# Patient Record
Sex: Male | Born: 1945 | Race: White | Hispanic: No | Marital: Married | Smoking: Former smoker
Health system: Southern US, Community
[De-identification: ages and names within clinical notes are randomized; demographics above are authoritative.]

## PROBLEM LIST (undated history)

## (undated) DIAGNOSIS — K219 Gastro-esophageal reflux disease without esophagitis: Secondary | ICD-10-CM

## (undated) DIAGNOSIS — I2510 Atherosclerotic heart disease of native coronary artery without angina pectoris: Secondary | ICD-10-CM

## (undated) DIAGNOSIS — I10 Essential (primary) hypertension: Secondary | ICD-10-CM

## (undated) DIAGNOSIS — C801 Malignant (primary) neoplasm, unspecified: Secondary | ICD-10-CM

## (undated) DIAGNOSIS — M1990 Unspecified osteoarthritis, unspecified site: Secondary | ICD-10-CM

## (undated) HISTORY — PX: FRACTURE SURGERY: SHX138

## (undated) HISTORY — PX: COLONOSCOPY: SHX174

## (undated) HISTORY — PX: APPENDECTOMY: SHX54

## (undated) HISTORY — PX: CARDIAC CATHETERIZATION: SHX172

## (undated) HISTORY — PX: CORONARY ANGIOPLASTY: SHX604

---

## 2004-11-05 DIAGNOSIS — I219 Acute myocardial infarction, unspecified: Secondary | ICD-10-CM

## 2004-11-05 HISTORY — DX: Acute myocardial infarction, unspecified: I21.9

## 2005-02-15 ENCOUNTER — Emergency Department: Payer: Self-pay | Admitting: Emergency Medicine

## 2005-02-15 IMAGING — CR DG CHEST 1V PORT
1 series · 2 of 2 positions shown · non-contrast
Comparison: none

REASON FOR EXAM: chest pain  acute mi  [HOSPITAL]
COMMENTS:

PROCEDURE:     DXR - DXR PORTABLE CHEST SINGLE VIEW  - [DATE]  [DATE]
RESULT:       The lungs are clear.   Cardiovascular structures are
unremarkable.

[Series 1: view not recorded · 0.17mm/px · 2 of 2 slices shown]
[im 1/2]
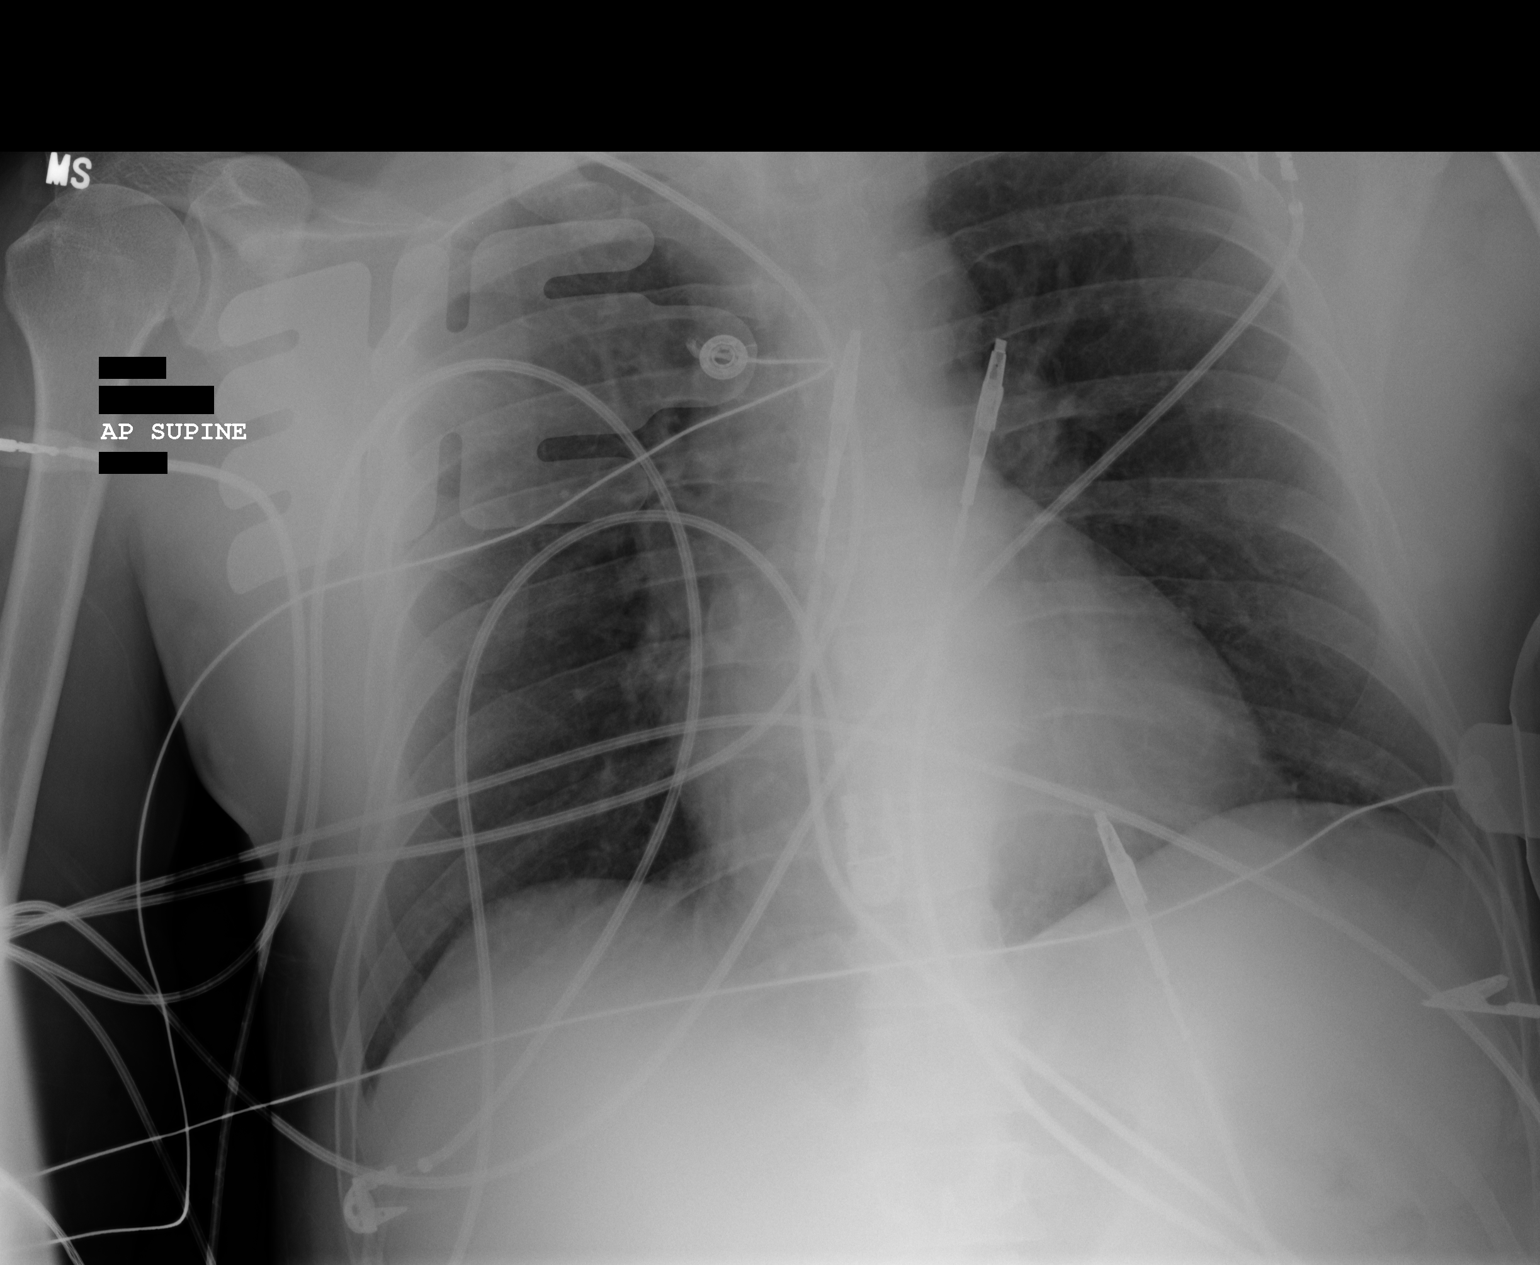
[im 2/2]
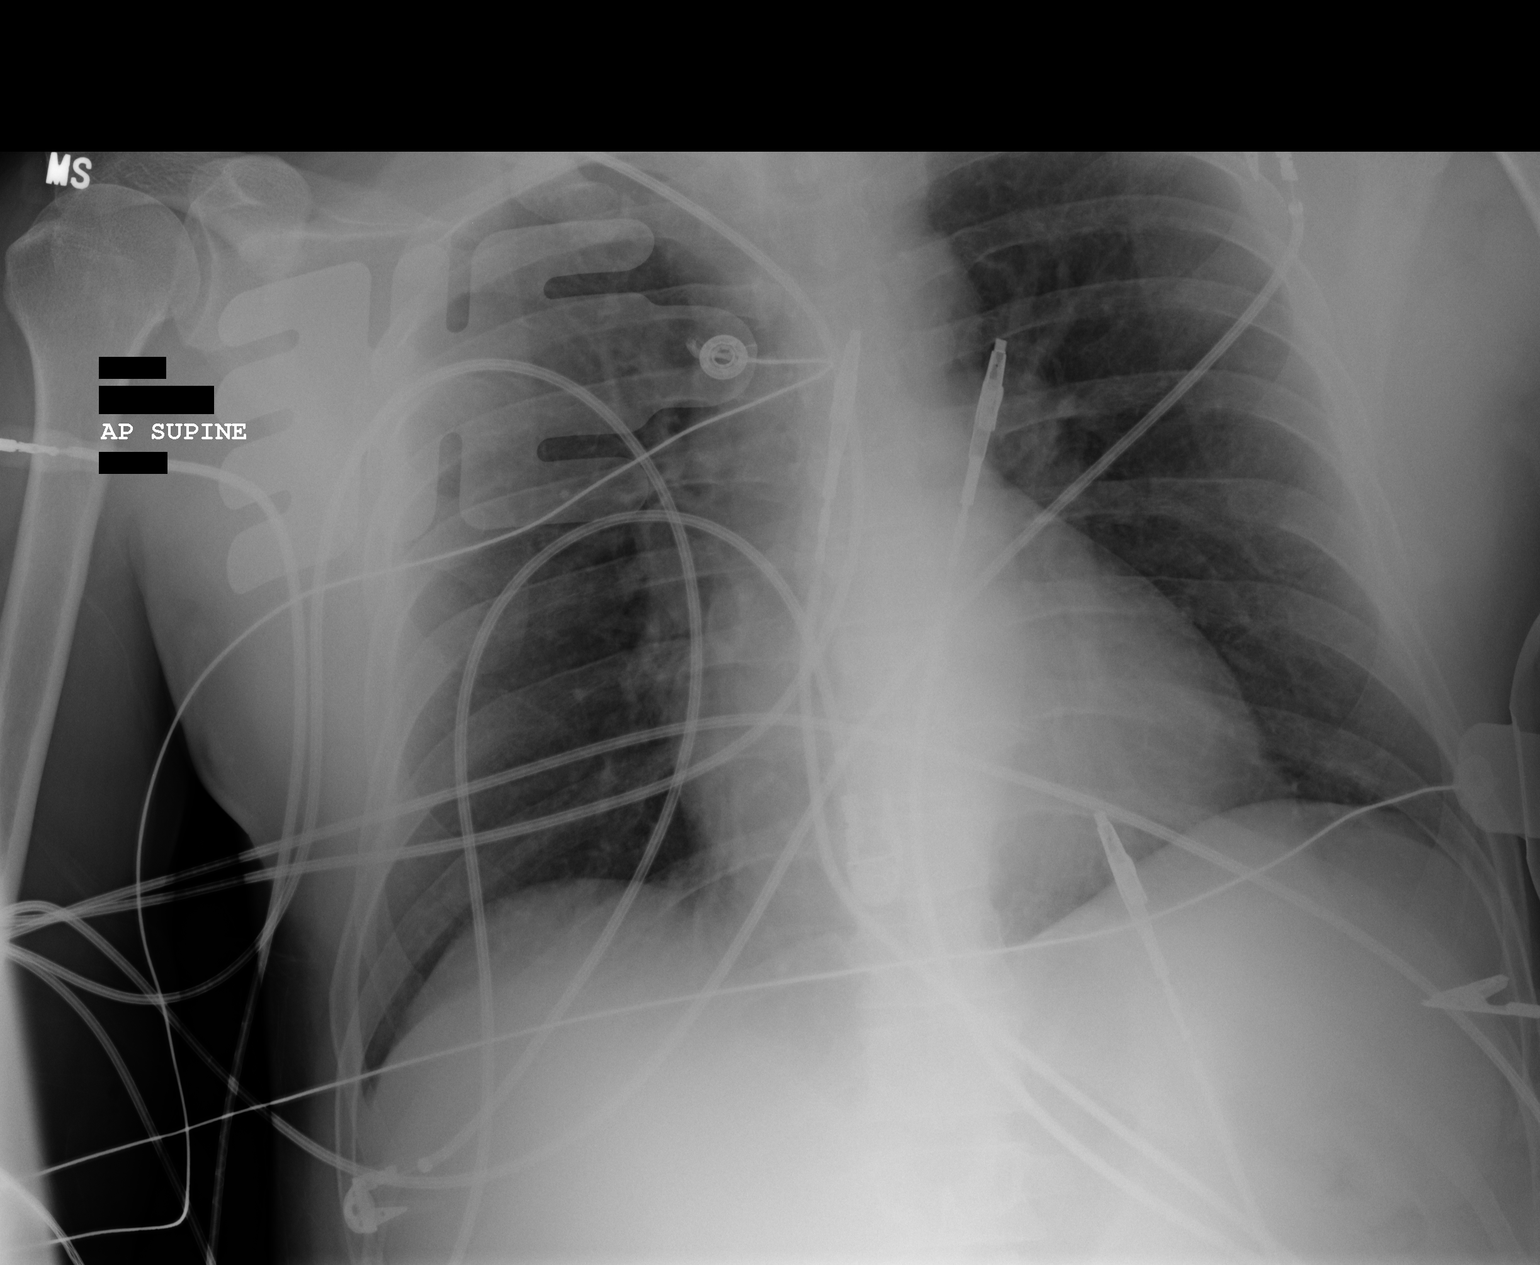

[2 of 2 positions shown; findings below may reference images not displayed]

IMPRESSION: No acute cardiopulmonary disease.

## 2005-04-17 ENCOUNTER — Emergency Department: Payer: Self-pay | Admitting: Emergency Medicine

## 2006-11-20 ENCOUNTER — Ambulatory Visit: Payer: Self-pay | Admitting: Family Medicine

## 2006-11-20 IMAGING — CT CT CHEST W/ CM
1 series · 16 of 33 positions shown, 20 images · IV contrast (agent unspecified)
Comparison: none

REASON FOR EXAM: cough SOB
COMMENTS:

PROCEDURE:     CT  - CT CHEST WITH CONTRAST  - [DATE]  [DATE]
RESULT:
REASON FOR CONSULTATION:  Cough with shortness of breath.
TECHNIQUE: Axial images were obtained from the apices to the hemidiaphragm
post intravenous injection of contrast material.  Both mediastinal and lung
window settings were obtained.

[Series 2: soft tissue · axial · 0.77mm/px · z∈[-286,+8]mm · 16 of 65 slices shown, 20 images]
[im 3/65  mediastinal]
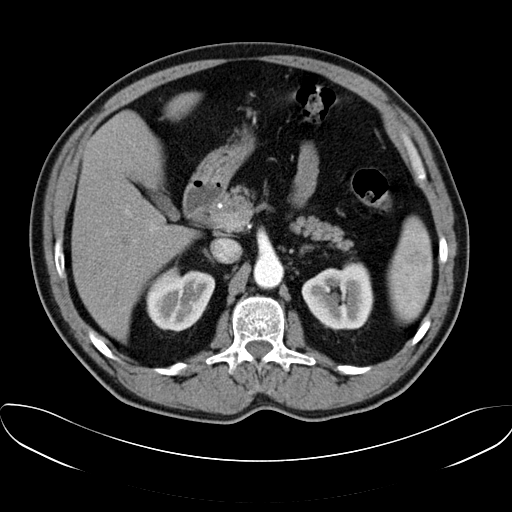
[im 3/65  lung]
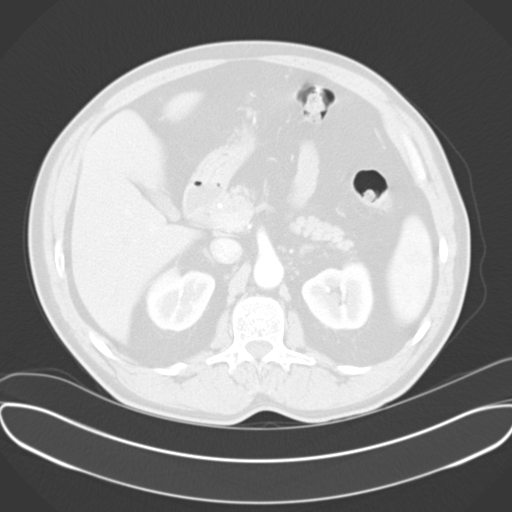
[im 8/65  lung]
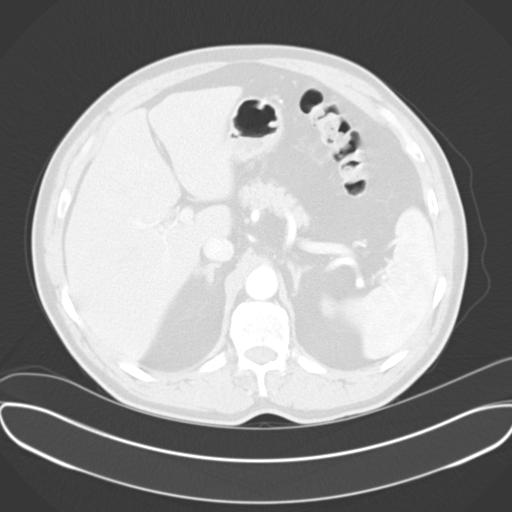
[im 12/65  lung]
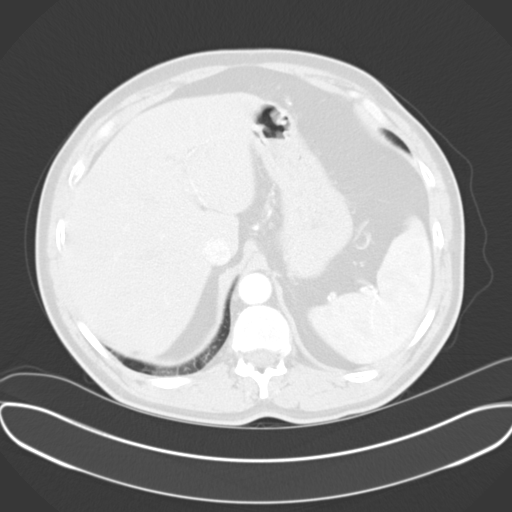
[im 15/65  lung]
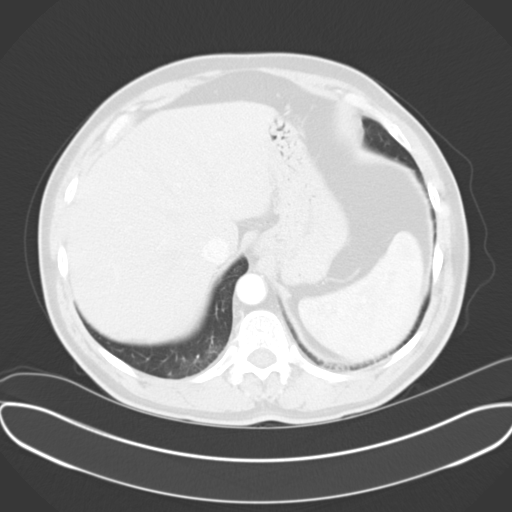
[im 19/65  mediastinal]
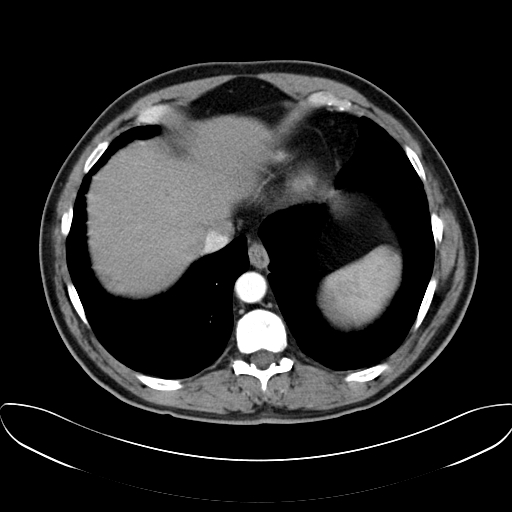
[im 19/65  lung]
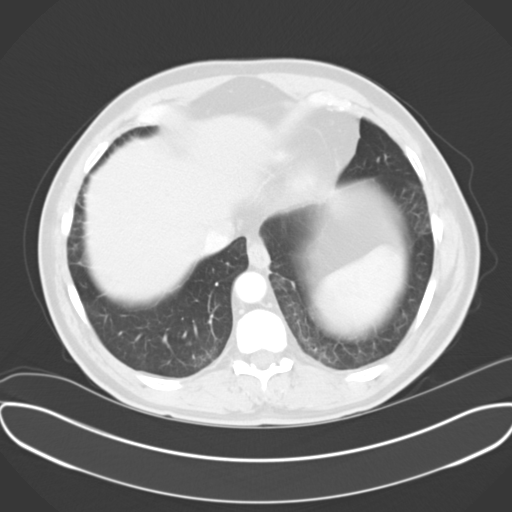
[im 24/65  lung]
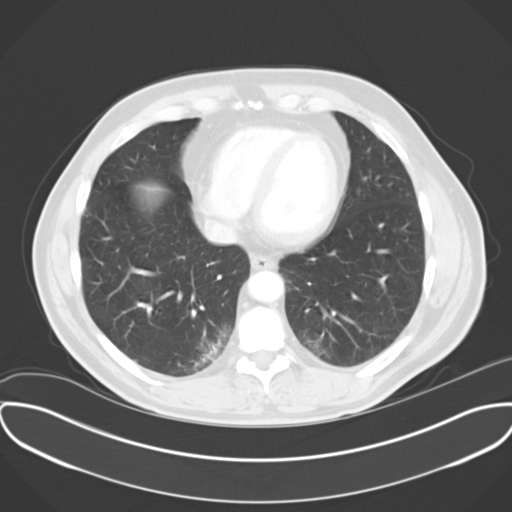
[im 27/65  lung]
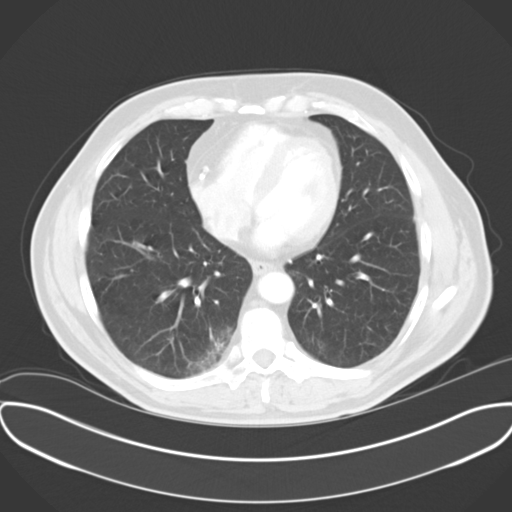
[im 31/65  lung]
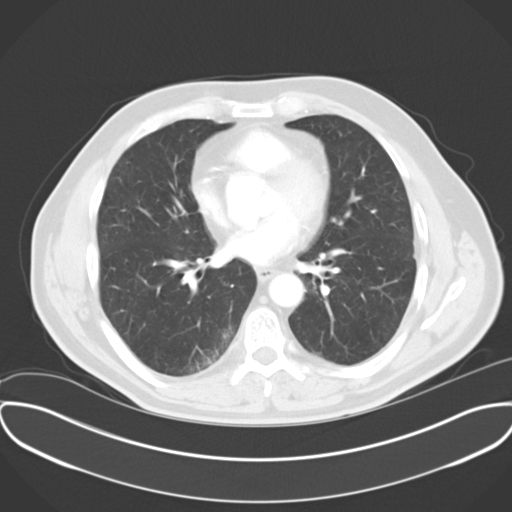
[im 35/65  mediastinal]
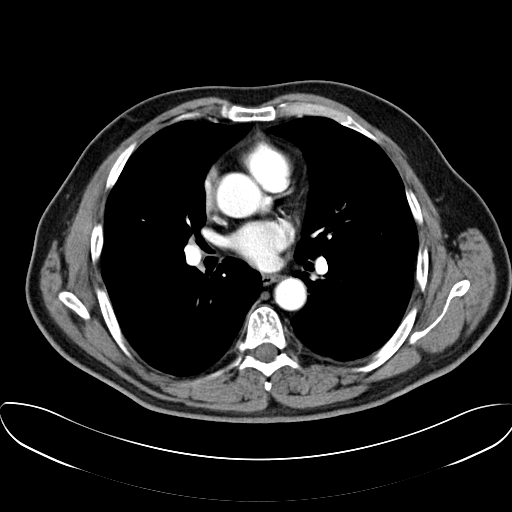
[im 35/65  lung]
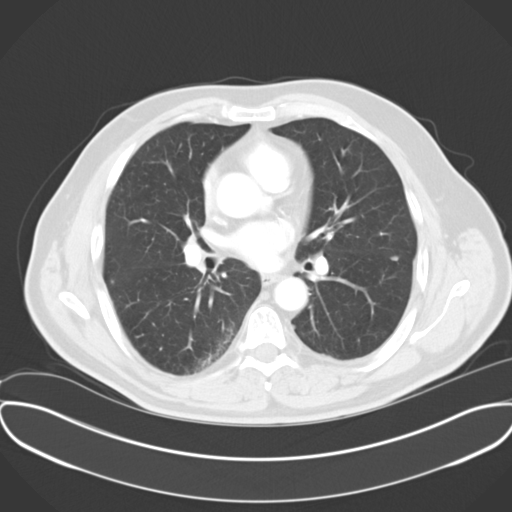
[im 38/65  lung]
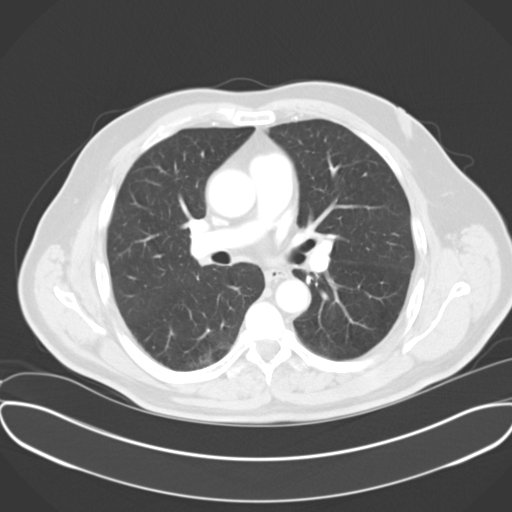
[im 41/65  lung]
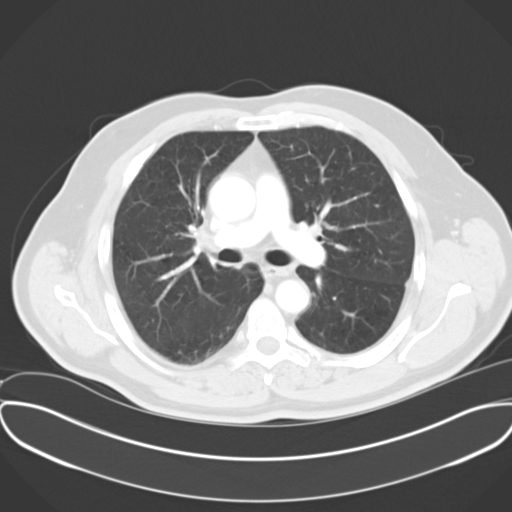
[im 46/65  lung]
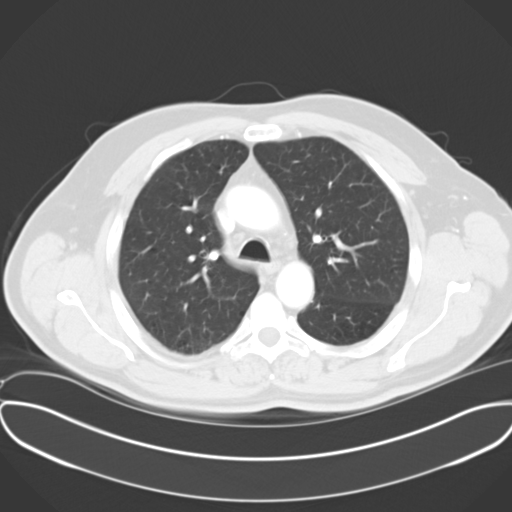
[im 50/65  mediastinal]
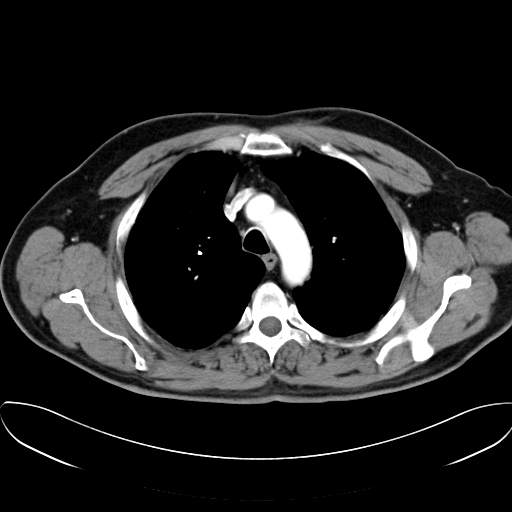
[im 50/65  lung]
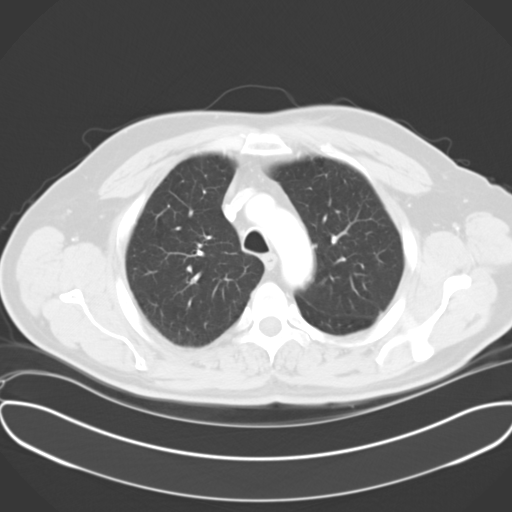
[im 53/65  lung]
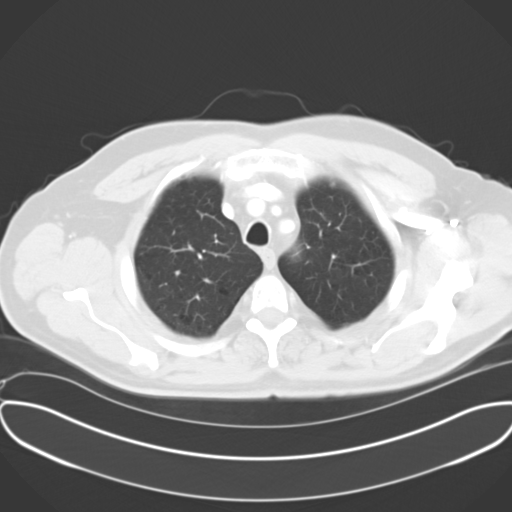
[im 57/65  lung]
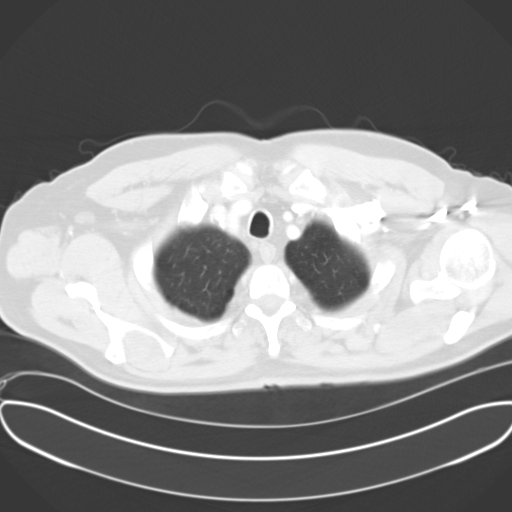
[im 62/65  lung]
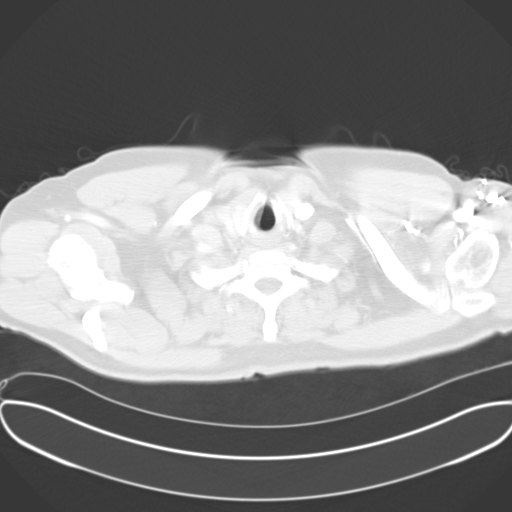

[16 of 33 positions shown; findings below may reference images not displayed]

FINDINGS: On the mediastinal window settings there is some shotty lymph
nodes in the upper mediastinum and aorticopulmonary window.  No hilar or
subcarinal adenopathy is noted.  On the lung window settings some dependent
atelectasis is noted in the lung bases in the paraspinal regions.  No
effusion is identified.  No nodules are seen.
IMPRESSION: Dependent atelectasis in the lung bases and paraspinal areas, otherwise no
additional abnormalities are detected.

## 2008-02-09 ENCOUNTER — Ambulatory Visit: Payer: Self-pay | Admitting: Family Medicine

## 2008-02-09 IMAGING — RF DG BARIUM SWALLOW
1 series · 15 of 24 positions shown · non-contrast
Comparison: none

REASON FOR EXAM: Hiatal hernia with chronic back pain
COMMENTS:

[Series 1: run · 14 acquisitions, 15 frames shown]
[im 1/14]
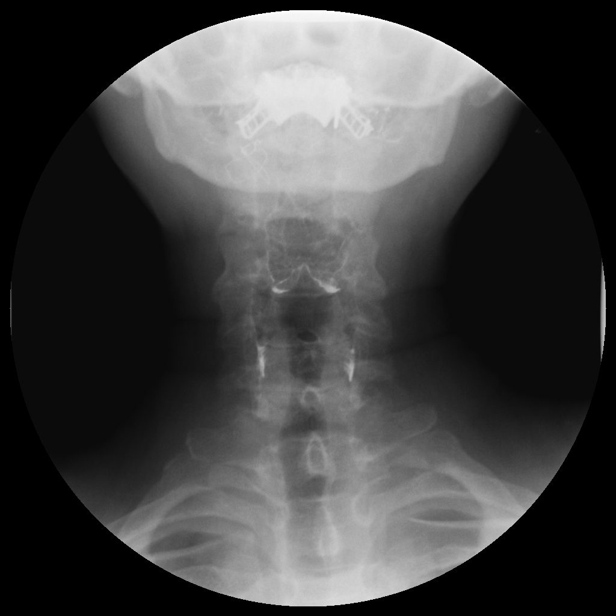
[im 1/14]
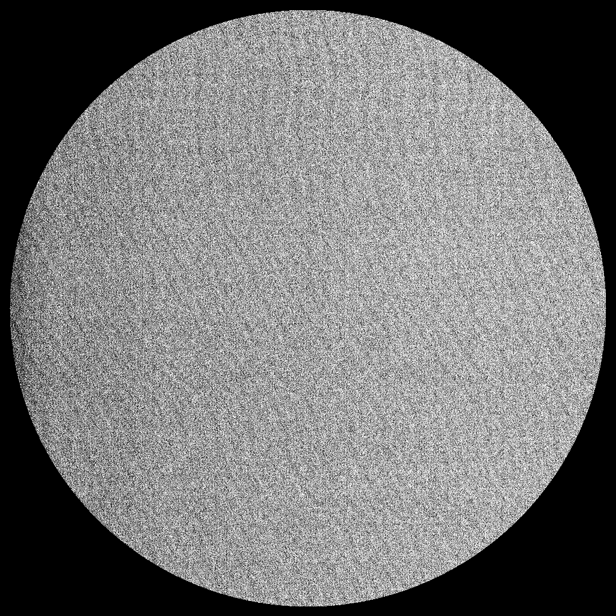
[im 2/14]
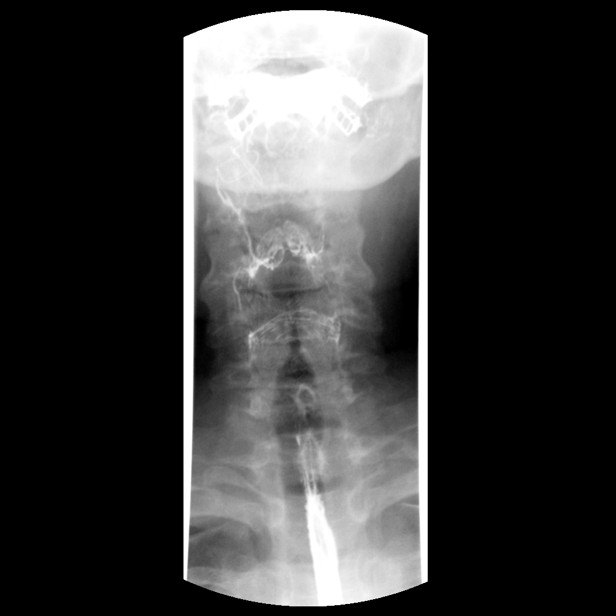
[im 2/14]
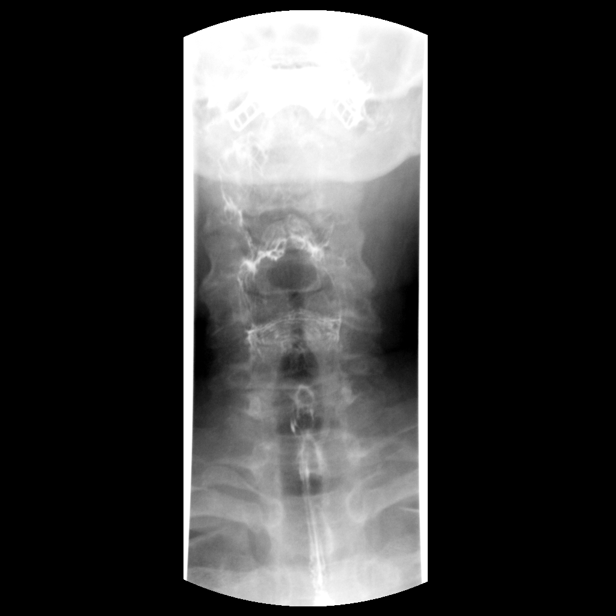
[im 3/14]
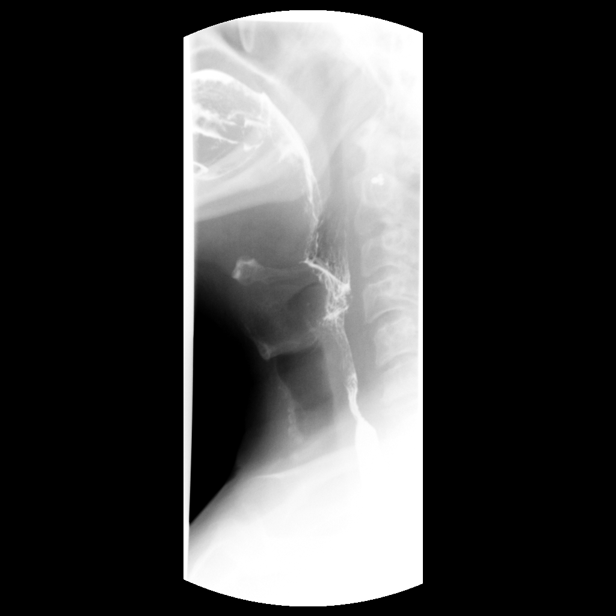
[im 3/14]
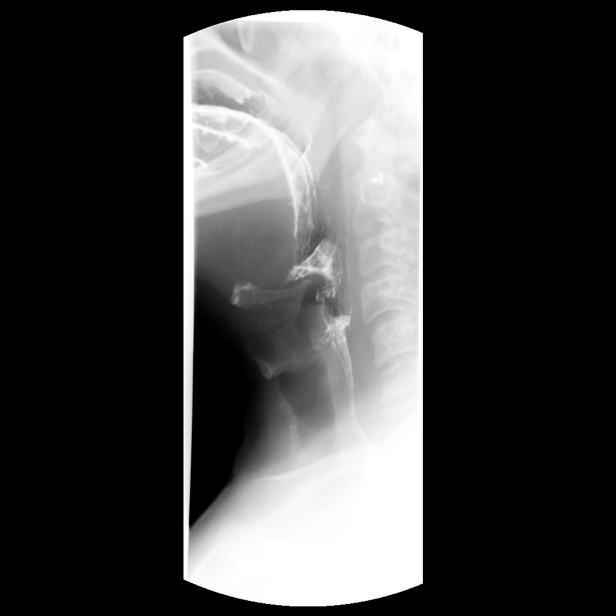
[im 4/14]
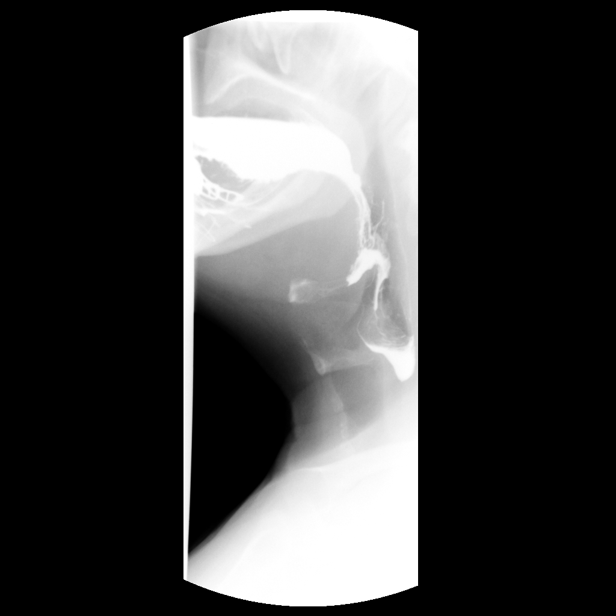
[im 4/14]
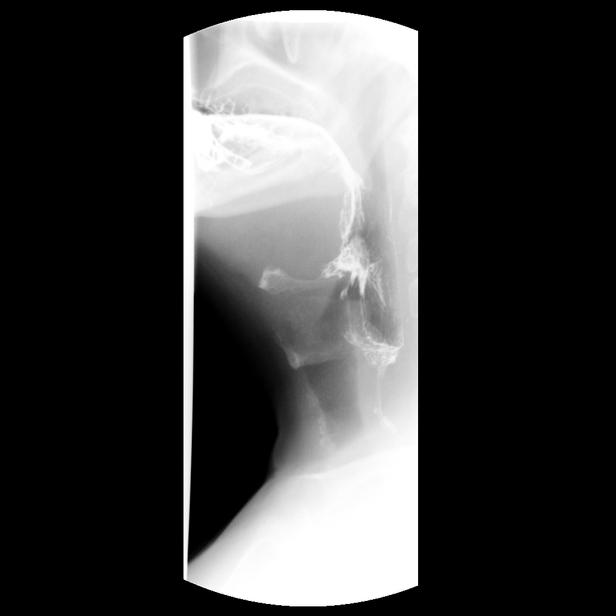
[im 5/14]
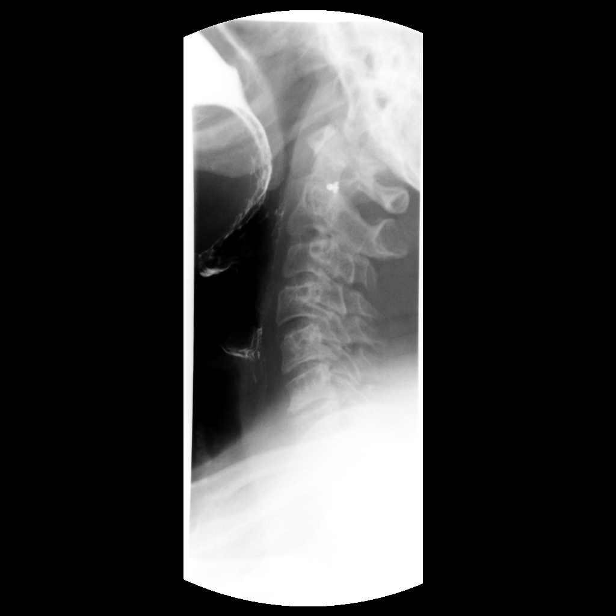
[im 5/14]
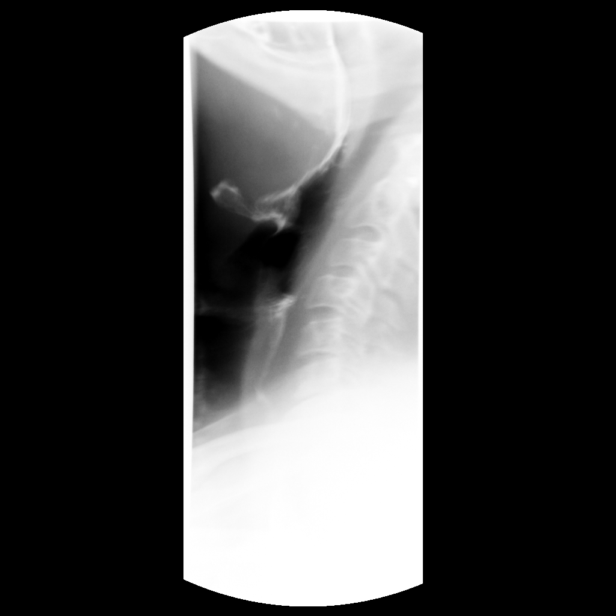
[im 6/14]
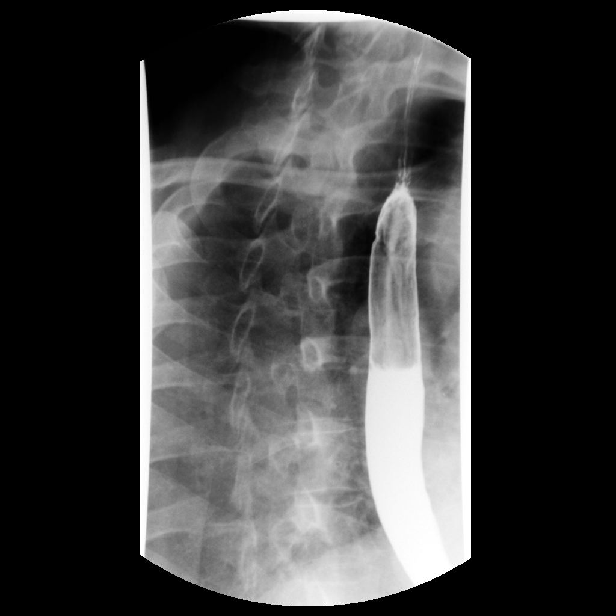
[im 8/14]
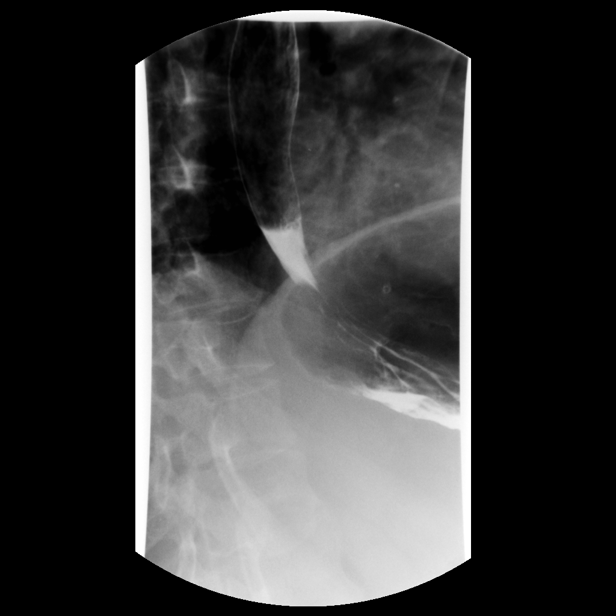
[im 10/14]
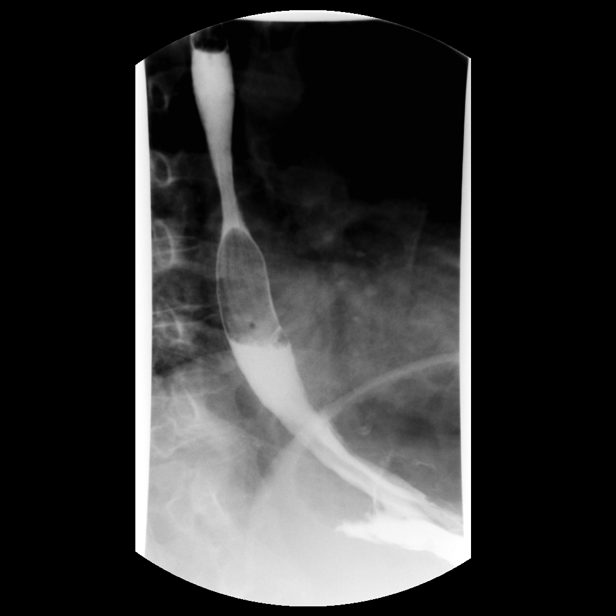
[im 12/14]
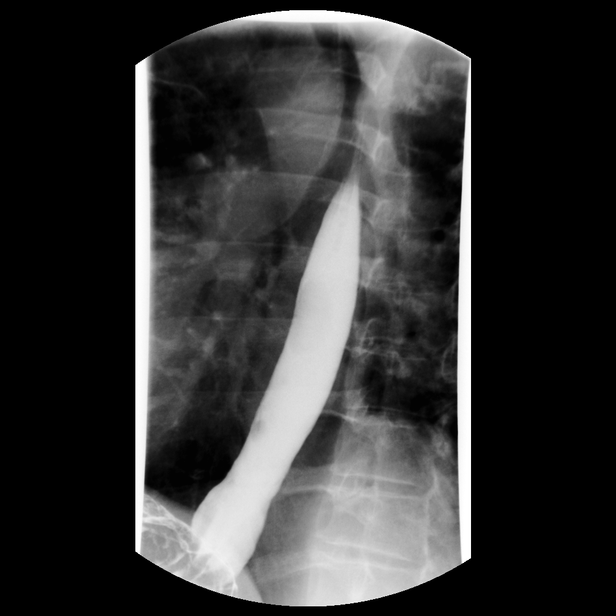
[im 14/14]
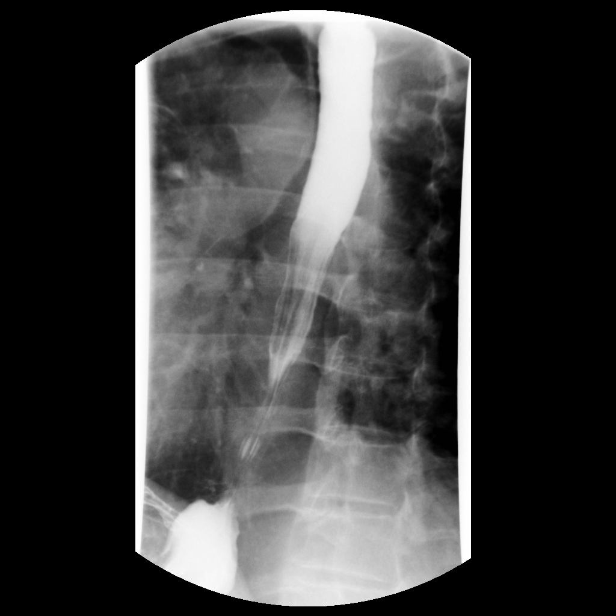

[15 of 24 positions shown; findings below may reference images not displayed]

PROCEDURE:     FL  - FL BARIUM SWALLOW  - [DATE]  [DATE]

RESULT:     The patient was given oral barium and dynamic imaging of the
cervical esophagus was performed without radiographic abnormalities. This is
followed by administration of effervescent crystals. The thoracic esophagus
demonstrates no evidence of focal out-pouchings, fusiform dilatation,
strictural narrowing or filling defects. There is appropriate relaxation of
the lower esophageal sphincter. The patient demonstrated esophageal
dysmotility during swallowing evident by decreased primary stripping wave
and to-and-fro motion within the esophagus. There is no evidence of a hiatal
hernia and no gastroesophageal reflux. A 12.5 mm JIM was administered
which traversed the esophagus without complications.
IMPRESSION: Esophageal dysmotility which appears to be moderate as
described above without further abnormalities.

## 2010-01-05 ENCOUNTER — Emergency Department: Payer: Self-pay | Admitting: Unknown Physician Specialty

## 2010-01-05 IMAGING — CR DG CHEST 2V
1 series · 2 of 2 positions shown · non-contrast
Comparison: none

REASON FOR EXAM: cp
COMMENTS:   May transport without cardiac monitor

[Series 1: view not recorded · 0.17mm/px · 2 of 2 slices shown]
[im 1/2]
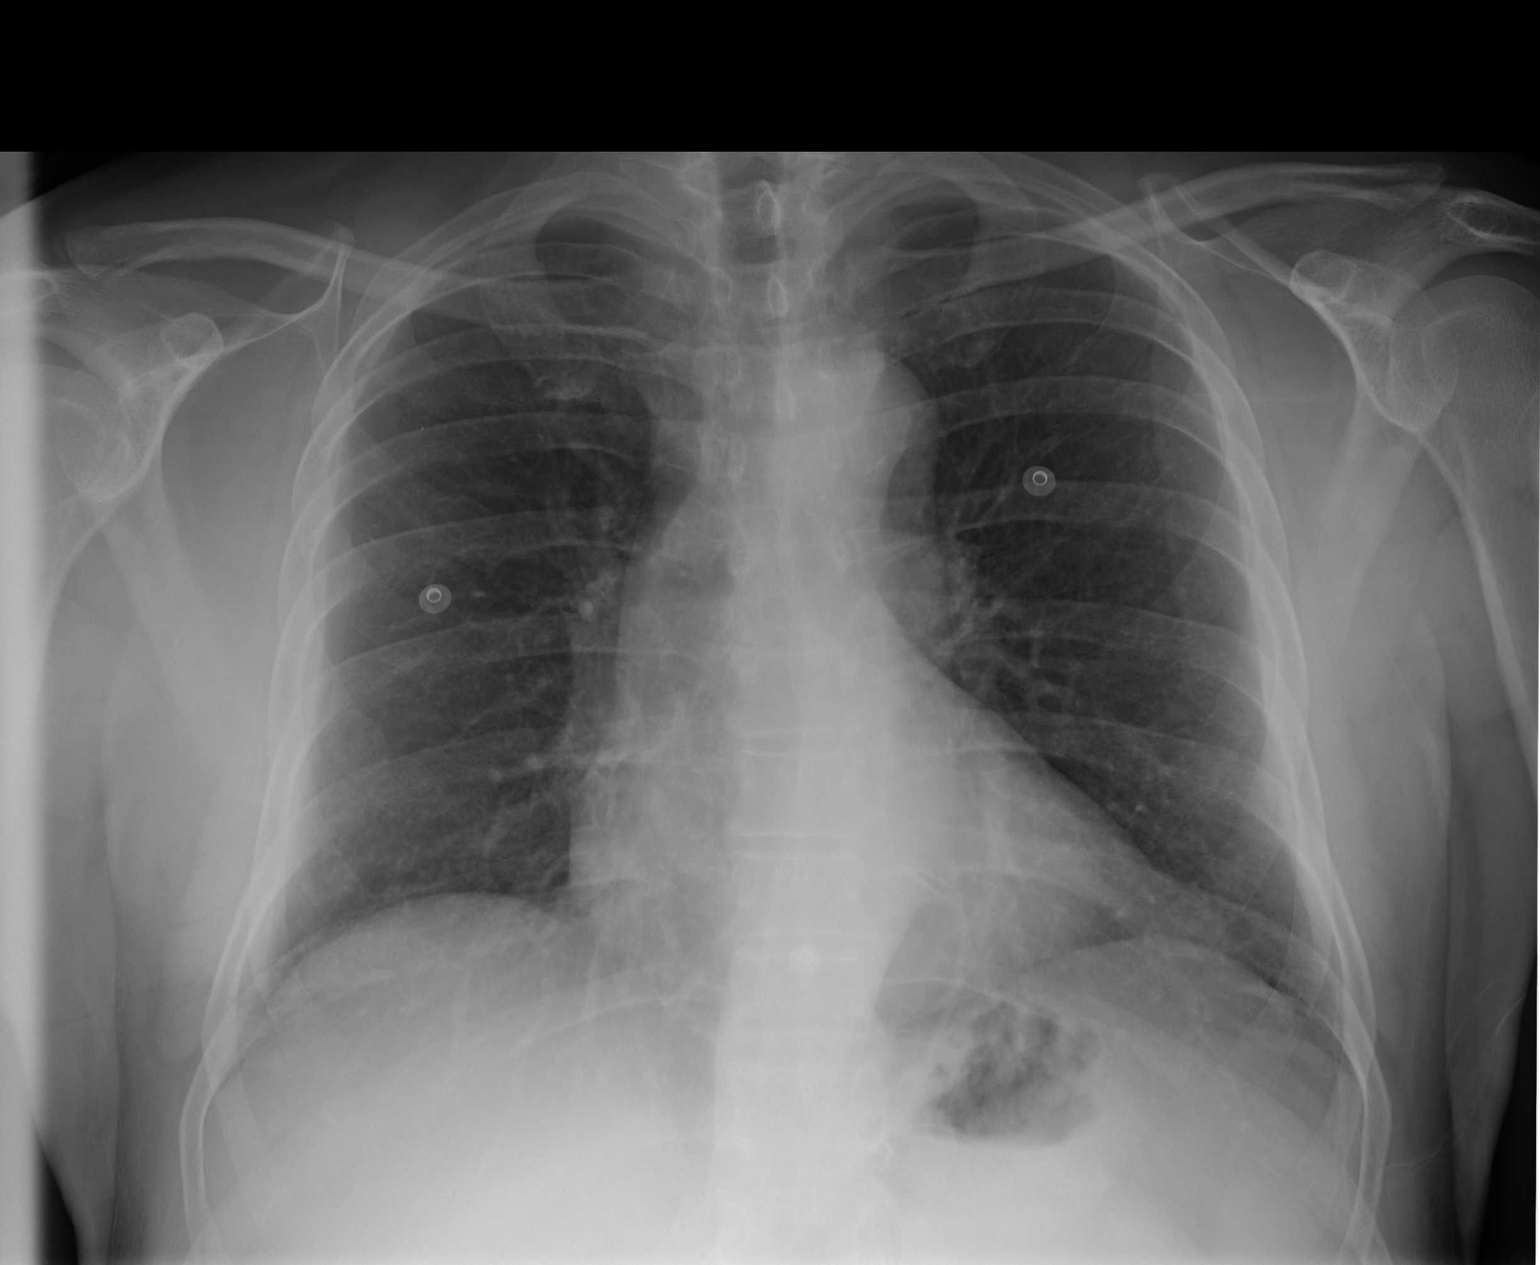
[im 2/2]
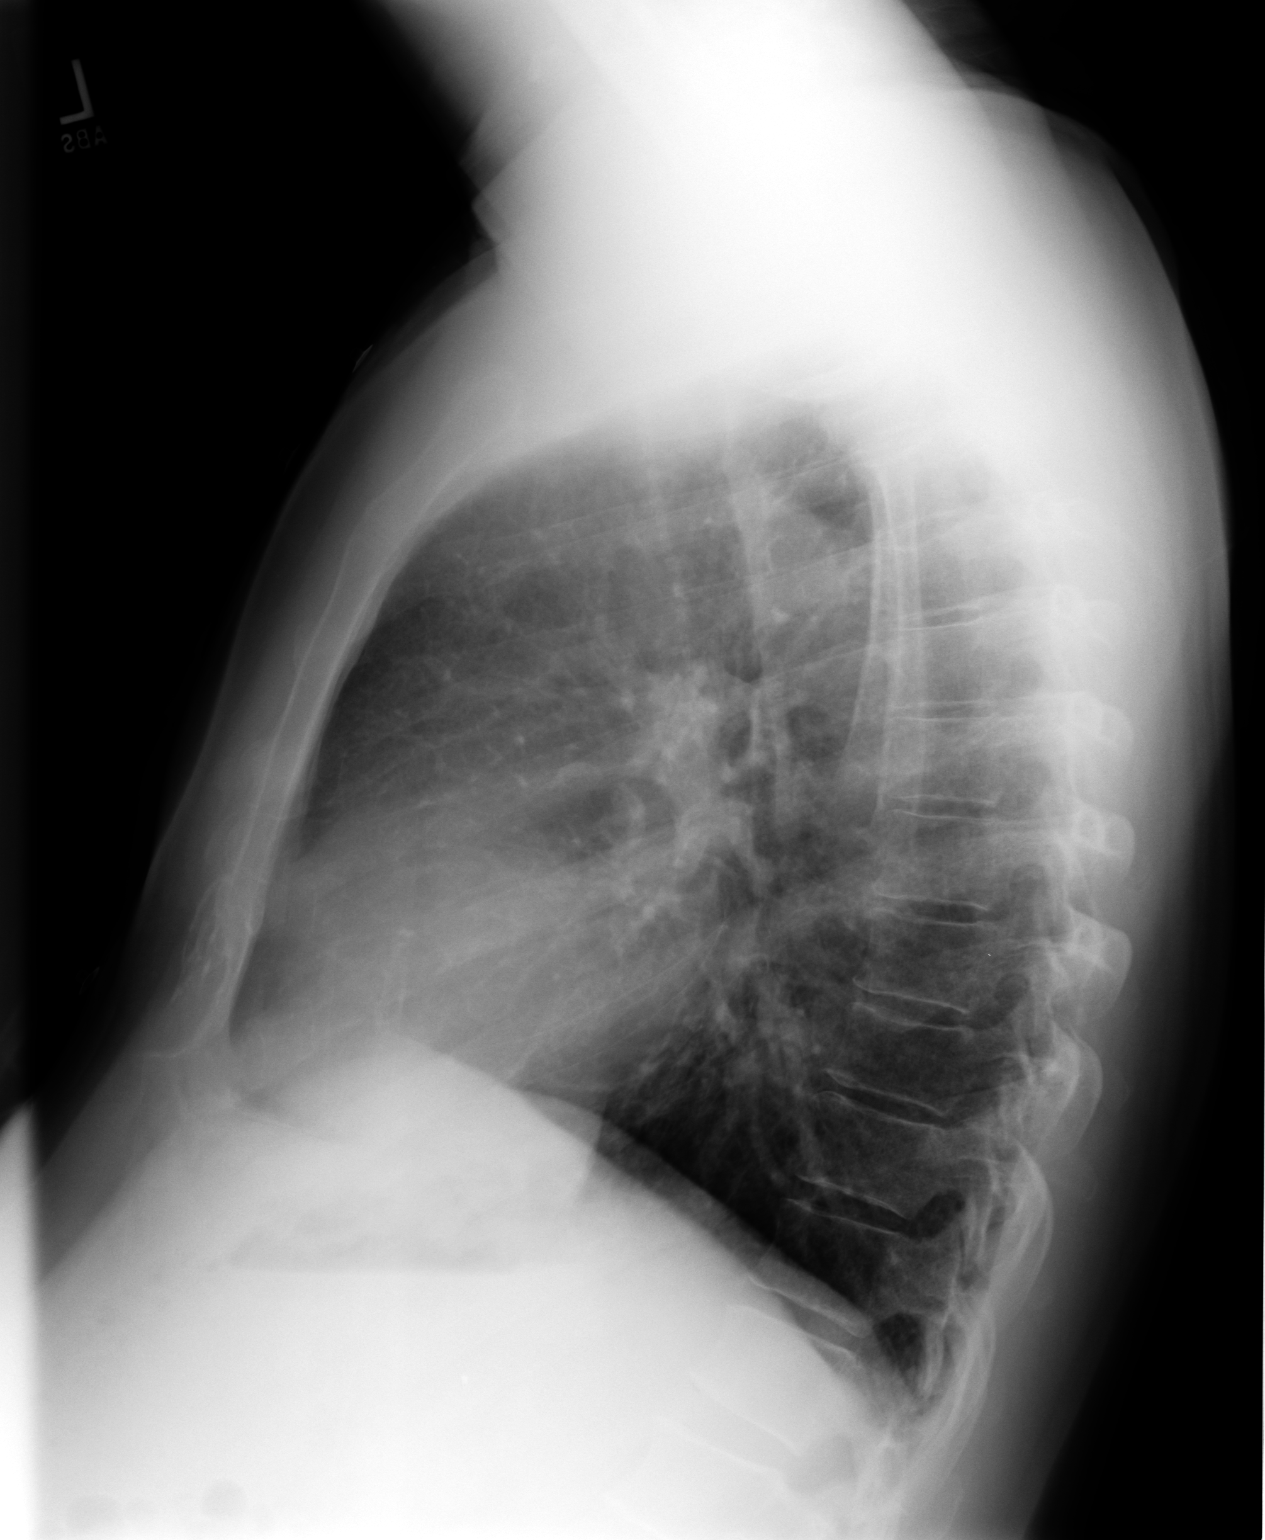

[2 of 2 positions shown; findings below may reference images not displayed]

PROCEDURE:     DXR - DXR CHEST PA (OR AP) AND LATERAL  - [DATE] [DATE]

RESULT:     There is no previous exam for comparison.

The lungs are clear. The heart and pulmonary vessels are normal. The bony
and mediastinal structures are unremarkable. There is no effusion. There is
no pneumothorax or evidence of congestive failure.
IMPRESSION: No acute cardiopulmonary disease.

## 2011-01-02 ENCOUNTER — Ambulatory Visit: Payer: Self-pay | Admitting: Gastroenterology

## 2011-01-04 LAB — PATHOLOGY REPORT

## 2012-03-21 ENCOUNTER — Ambulatory Visit: Payer: Self-pay | Admitting: Gastroenterology

## 2015-04-27 DIAGNOSIS — M545 Low back pain: Secondary | ICD-10-CM | POA: Diagnosis not present

## 2015-04-27 DIAGNOSIS — I10 Essential (primary) hypertension: Secondary | ICD-10-CM | POA: Diagnosis not present

## 2015-04-27 DIAGNOSIS — I2510 Atherosclerotic heart disease of native coronary artery without angina pectoris: Secondary | ICD-10-CM | POA: Diagnosis not present

## 2015-04-27 DIAGNOSIS — E780 Pure hypercholesterolemia: Secondary | ICD-10-CM | POA: Diagnosis not present

## 2015-10-03 DIAGNOSIS — L309 Dermatitis, unspecified: Secondary | ICD-10-CM | POA: Diagnosis not present

## 2015-10-03 DIAGNOSIS — L570 Actinic keratosis: Secondary | ICD-10-CM | POA: Diagnosis not present

## 2015-10-20 DIAGNOSIS — I2510 Atherosclerotic heart disease of native coronary artery without angina pectoris: Secondary | ICD-10-CM | POA: Diagnosis not present

## 2015-10-20 DIAGNOSIS — E7800 Pure hypercholesterolemia, unspecified: Secondary | ICD-10-CM | POA: Diagnosis not present

## 2015-10-20 DIAGNOSIS — Z0001 Encounter for general adult medical examination with abnormal findings: Secondary | ICD-10-CM | POA: Diagnosis not present

## 2015-10-20 DIAGNOSIS — M545 Low back pain: Secondary | ICD-10-CM | POA: Diagnosis not present

## 2015-10-20 DIAGNOSIS — I10 Essential (primary) hypertension: Secondary | ICD-10-CM | POA: Diagnosis not present

## 2015-10-20 DIAGNOSIS — G8929 Other chronic pain: Secondary | ICD-10-CM | POA: Diagnosis not present

## 2016-04-19 DIAGNOSIS — I10 Essential (primary) hypertension: Secondary | ICD-10-CM | POA: Diagnosis not present

## 2016-04-19 DIAGNOSIS — I2510 Atherosclerotic heart disease of native coronary artery without angina pectoris: Secondary | ICD-10-CM | POA: Diagnosis not present

## 2016-04-19 DIAGNOSIS — E7800 Pure hypercholesterolemia, unspecified: Secondary | ICD-10-CM | POA: Diagnosis not present

## 2016-04-19 DIAGNOSIS — Z125 Encounter for screening for malignant neoplasm of prostate: Secondary | ICD-10-CM | POA: Diagnosis not present

## 2016-08-31 DIAGNOSIS — J069 Acute upper respiratory infection, unspecified: Secondary | ICD-10-CM | POA: Diagnosis not present

## 2016-11-07 DIAGNOSIS — I10 Essential (primary) hypertension: Secondary | ICD-10-CM | POA: Diagnosis not present

## 2016-11-07 DIAGNOSIS — E7800 Pure hypercholesterolemia, unspecified: Secondary | ICD-10-CM | POA: Diagnosis not present

## 2016-11-07 DIAGNOSIS — Z125 Encounter for screening for malignant neoplasm of prostate: Secondary | ICD-10-CM | POA: Diagnosis not present

## 2016-11-14 DIAGNOSIS — Z0001 Encounter for general adult medical examination with abnormal findings: Secondary | ICD-10-CM | POA: Diagnosis not present

## 2016-11-14 DIAGNOSIS — I10 Essential (primary) hypertension: Secondary | ICD-10-CM | POA: Diagnosis not present

## 2016-11-14 DIAGNOSIS — E7800 Pure hypercholesterolemia, unspecified: Secondary | ICD-10-CM | POA: Diagnosis not present

## 2016-11-14 DIAGNOSIS — I2510 Atherosclerotic heart disease of native coronary artery without angina pectoris: Secondary | ICD-10-CM | POA: Diagnosis not present

## 2017-05-14 DIAGNOSIS — G8929 Other chronic pain: Secondary | ICD-10-CM | POA: Diagnosis not present

## 2017-05-14 DIAGNOSIS — I2510 Atherosclerotic heart disease of native coronary artery without angina pectoris: Secondary | ICD-10-CM | POA: Diagnosis not present

## 2017-05-14 DIAGNOSIS — M545 Low back pain: Secondary | ICD-10-CM | POA: Diagnosis not present

## 2017-05-14 DIAGNOSIS — Z0000 Encounter for general adult medical examination without abnormal findings: Secondary | ICD-10-CM | POA: Diagnosis not present

## 2017-05-14 DIAGNOSIS — I10 Essential (primary) hypertension: Secondary | ICD-10-CM | POA: Diagnosis not present

## 2017-11-08 DIAGNOSIS — I10 Essential (primary) hypertension: Secondary | ICD-10-CM | POA: Diagnosis not present

## 2017-11-08 DIAGNOSIS — I2510 Atherosclerotic heart disease of native coronary artery without angina pectoris: Secondary | ICD-10-CM | POA: Diagnosis not present

## 2017-11-15 DIAGNOSIS — Z0001 Encounter for general adult medical examination with abnormal findings: Secondary | ICD-10-CM | POA: Diagnosis not present

## 2017-11-15 DIAGNOSIS — M545 Low back pain: Secondary | ICD-10-CM | POA: Diagnosis not present

## 2017-11-15 DIAGNOSIS — I10 Essential (primary) hypertension: Secondary | ICD-10-CM | POA: Diagnosis not present

## 2017-11-15 DIAGNOSIS — E7800 Pure hypercholesterolemia, unspecified: Secondary | ICD-10-CM | POA: Diagnosis not present

## 2017-11-15 DIAGNOSIS — G8929 Other chronic pain: Secondary | ICD-10-CM | POA: Diagnosis not present

## 2017-11-15 DIAGNOSIS — I2510 Atherosclerotic heart disease of native coronary artery without angina pectoris: Secondary | ICD-10-CM | POA: Diagnosis not present

## 2018-05-27 DIAGNOSIS — I10 Essential (primary) hypertension: Secondary | ICD-10-CM | POA: Diagnosis not present

## 2018-06-03 DIAGNOSIS — I2510 Atherosclerotic heart disease of native coronary artery without angina pectoris: Secondary | ICD-10-CM | POA: Diagnosis not present

## 2018-06-03 DIAGNOSIS — E7800 Pure hypercholesterolemia, unspecified: Secondary | ICD-10-CM | POA: Diagnosis not present

## 2018-06-03 DIAGNOSIS — M545 Low back pain: Secondary | ICD-10-CM | POA: Diagnosis not present

## 2018-06-03 DIAGNOSIS — Z125 Encounter for screening for malignant neoplasm of prostate: Secondary | ICD-10-CM | POA: Diagnosis not present

## 2018-06-03 DIAGNOSIS — G8929 Other chronic pain: Secondary | ICD-10-CM | POA: Diagnosis not present

## 2018-06-03 DIAGNOSIS — I10 Essential (primary) hypertension: Secondary | ICD-10-CM | POA: Diagnosis not present

## 2018-11-28 DIAGNOSIS — I10 Essential (primary) hypertension: Secondary | ICD-10-CM | POA: Diagnosis not present

## 2018-11-28 DIAGNOSIS — Z125 Encounter for screening for malignant neoplasm of prostate: Secondary | ICD-10-CM | POA: Diagnosis not present

## 2018-11-28 DIAGNOSIS — E7800 Pure hypercholesterolemia, unspecified: Secondary | ICD-10-CM | POA: Diagnosis not present

## 2018-12-02 DIAGNOSIS — M545 Low back pain: Secondary | ICD-10-CM | POA: Diagnosis not present

## 2018-12-02 DIAGNOSIS — N401 Enlarged prostate with lower urinary tract symptoms: Secondary | ICD-10-CM | POA: Diagnosis not present

## 2018-12-02 DIAGNOSIS — E7800 Pure hypercholesterolemia, unspecified: Secondary | ICD-10-CM | POA: Diagnosis not present

## 2018-12-02 DIAGNOSIS — R739 Hyperglycemia, unspecified: Secondary | ICD-10-CM | POA: Diagnosis not present

## 2018-12-02 DIAGNOSIS — G8929 Other chronic pain: Secondary | ICD-10-CM | POA: Diagnosis not present

## 2018-12-02 DIAGNOSIS — I10 Essential (primary) hypertension: Secondary | ICD-10-CM | POA: Diagnosis not present

## 2018-12-02 DIAGNOSIS — Z0000 Encounter for general adult medical examination without abnormal findings: Secondary | ICD-10-CM | POA: Diagnosis not present

## 2018-12-02 DIAGNOSIS — I2510 Atherosclerotic heart disease of native coronary artery without angina pectoris: Secondary | ICD-10-CM | POA: Diagnosis not present

## 2018-12-02 DIAGNOSIS — Z0001 Encounter for general adult medical examination with abnormal findings: Secondary | ICD-10-CM | POA: Diagnosis not present

## 2019-06-02 DIAGNOSIS — E7800 Pure hypercholesterolemia, unspecified: Secondary | ICD-10-CM | POA: Diagnosis not present

## 2019-06-02 DIAGNOSIS — I10 Essential (primary) hypertension: Secondary | ICD-10-CM | POA: Diagnosis not present

## 2019-06-02 DIAGNOSIS — Z125 Encounter for screening for malignant neoplasm of prostate: Secondary | ICD-10-CM | POA: Diagnosis not present

## 2019-06-02 DIAGNOSIS — M545 Low back pain: Secondary | ICD-10-CM | POA: Diagnosis not present

## 2019-06-02 DIAGNOSIS — I2510 Atherosclerotic heart disease of native coronary artery without angina pectoris: Secondary | ICD-10-CM | POA: Diagnosis not present

## 2019-06-02 DIAGNOSIS — Z20828 Contact with and (suspected) exposure to other viral communicable diseases: Secondary | ICD-10-CM | POA: Diagnosis not present

## 2019-06-02 DIAGNOSIS — G8929 Other chronic pain: Secondary | ICD-10-CM | POA: Diagnosis not present

## 2019-11-03 DIAGNOSIS — Z87891 Personal history of nicotine dependence: Secondary | ICD-10-CM | POA: Diagnosis not present

## 2019-11-03 DIAGNOSIS — K648 Other hemorrhoids: Secondary | ICD-10-CM | POA: Diagnosis not present

## 2020-05-26 DIAGNOSIS — R739 Hyperglycemia, unspecified: Secondary | ICD-10-CM | POA: Diagnosis not present

## 2020-05-26 DIAGNOSIS — I10 Essential (primary) hypertension: Secondary | ICD-10-CM | POA: Diagnosis not present

## 2020-06-02 DIAGNOSIS — E7800 Pure hypercholesterolemia, unspecified: Secondary | ICD-10-CM | POA: Diagnosis not present

## 2020-06-02 DIAGNOSIS — G8929 Other chronic pain: Secondary | ICD-10-CM | POA: Diagnosis not present

## 2020-06-02 DIAGNOSIS — M545 Low back pain: Secondary | ICD-10-CM | POA: Diagnosis not present

## 2020-06-02 DIAGNOSIS — I10 Essential (primary) hypertension: Secondary | ICD-10-CM | POA: Diagnosis not present

## 2020-06-02 DIAGNOSIS — R7303 Prediabetes: Secondary | ICD-10-CM | POA: Diagnosis not present

## 2020-06-02 DIAGNOSIS — Z125 Encounter for screening for malignant neoplasm of prostate: Secondary | ICD-10-CM | POA: Diagnosis not present

## 2020-11-24 DIAGNOSIS — Z20822 Contact with and (suspected) exposure to covid-19: Secondary | ICD-10-CM | POA: Diagnosis not present

## 2020-12-08 DIAGNOSIS — Z0000 Encounter for general adult medical examination without abnormal findings: Secondary | ICD-10-CM | POA: Diagnosis not present

## 2020-12-08 DIAGNOSIS — U071 COVID-19: Secondary | ICD-10-CM | POA: Diagnosis not present

## 2020-12-08 DIAGNOSIS — I10 Essential (primary) hypertension: Secondary | ICD-10-CM | POA: Diagnosis not present

## 2020-12-08 DIAGNOSIS — E7800 Pure hypercholesterolemia, unspecified: Secondary | ICD-10-CM | POA: Diagnosis not present

## 2020-12-08 DIAGNOSIS — Z1211 Encounter for screening for malignant neoplasm of colon: Secondary | ICD-10-CM | POA: Diagnosis not present

## 2020-12-08 DIAGNOSIS — Z125 Encounter for screening for malignant neoplasm of prostate: Secondary | ICD-10-CM | POA: Diagnosis not present

## 2020-12-08 DIAGNOSIS — Z0001 Encounter for general adult medical examination with abnormal findings: Secondary | ICD-10-CM | POA: Diagnosis not present

## 2020-12-08 DIAGNOSIS — R7303 Prediabetes: Secondary | ICD-10-CM | POA: Diagnosis not present

## 2020-12-09 DIAGNOSIS — L720 Epidermal cyst: Secondary | ICD-10-CM | POA: Diagnosis not present

## 2020-12-13 DIAGNOSIS — R590 Localized enlarged lymph nodes: Secondary | ICD-10-CM | POA: Diagnosis not present

## 2020-12-14 ENCOUNTER — Ambulatory Visit: Payer: Self-pay | Admitting: Surgery

## 2020-12-14 NOTE — H&P (Signed)
Subjective:   CC: Left cervical lymphadenopathy [R59.0]  HPI:  Travis Marshall is a 75 y.o. male who was referred by Velna Ochs, MD for evaluation of above. First noted several months ago.  Increasing in size. Symptoms include: asymptomatic.  Also think he has similar lump in right abdomen for years.  Does endorse some night sweats, no recent wt loss or fevers.  Positive COVID with some chronic nasal drip or otherwise no upper respiratory symptoms.   Past Medical History: hx of MI  Past Surgical History:  has a past surgical history that includes Appendectomy and Fracture surgery.  Family History: family history includes Myocardial Infarction (Heart attack) in his mother.  Social History:  reports that he quit smoking about 15 years ago. He has a 48.00 pack-year smoking history. He has quit using smokeless tobacco. He reports current alcohol use. No history on file for drug use.  Current Medications: has a current medication list which includes the following prescription(s): aspirin, diphenhydramine, garlic, metoprolol succinate, omeprazole, oxycodone-acetaminophen, potassium gluconate, pravastatin, esomeprazole, and finasteride.  Allergies:  Allergies  Allergen Reactions  . Ace Inhibitors Unknown  . Lipitor [Atorvastatin] Muscle Pain    ROS:  A 15 point review of systems was performed and pertinent positives and negatives noted in HPI   Objective:     BP 139/79   Pulse 62   Ht 168.3 cm (5' 6.25")   Wt 78.9 kg (174 lb)   BMI 27.87 kg/m   Constitutional :  alert, appears stated age, cooperative and no distress  Lymphatics/Throat:  non-tender, firm, immobile nodule x2, size range 3cm -5cm in left side of neck, posterior scalne area, concerning for lymphadenopathy  Respiratory:  clear to auscultation bilaterally  Cardiovascular:  regular rate and rhythm  Gastrointestinal: soft, non-tender; bowel sounds normal; no masses,  no organomegaly.    Musculoskeletal: Steady  gait and movement  Skin: Cool and moist. No obvious lesions, tumors on head or neck  Psychiatric: Normal affect, non-agitated, not confused       LABS:  n/a   RADS: n/a  Assessment:      Left cervical lymphadenopathy [R59.0], no obvious cause reported, except for questionable night sweats.  Recent bloodwork WNL.  Recommend excisional biopsy for definitive diagnosis  Plan:     1. Left cervical lymphadenopathy [R59.0] Discussed surgical excision.  Alternatives include continued observation.  Benefits include possible symptom relief, pathologic evaluation, improved cosmesis. Discussed the risk of surgery including recurrence, chronic pain, post-op infxn, poor cosmesis, poor/delayed wound healing, and possible re-operation to address said risks. The risks of general anesthetic, if used, includes MI, CVA, sudden death or even reaction to anesthetic medications also discussed.  Typical post-op recovery time of 3-5 days with possible activity restrictions were also discussed.  The patient verbalized understanding and all questions were answered to the patient's satisfaction.  2. Patient has elected to proceed with surgical treatment. Procedure will be scheduled.  Written consent was obtained.

## 2020-12-14 NOTE — H&P (View-Only) (Signed)
Subjective:   CC: Left cervical lymphadenopathy [R59.0]  HPI:  Travis Marshall is a 75 y.o. male who was referred by Velna Ochs, MD for evaluation of above. First noted several months ago.  Increasing in size. Symptoms include: asymptomatic.  Also think he has similar lump in right abdomen for years.  Does endorse some night sweats, no recent wt loss or fevers.  Positive COVID with some chronic nasal drip or otherwise no upper respiratory symptoms.   Past Medical History: hx of MI  Past Surgical History:  has a past surgical history that includes Appendectomy and Fracture surgery.  Family History: family history includes Myocardial Infarction (Heart attack) in his mother.  Social History:  reports that he quit smoking about 15 years ago. He has a 48.00 pack-year smoking history. He has quit using smokeless tobacco. He reports current alcohol use. No history on file for drug use.  Current Medications: has a current medication list which includes the following prescription(s): aspirin, diphenhydramine, garlic, metoprolol succinate, omeprazole, oxycodone-acetaminophen, potassium gluconate, pravastatin, esomeprazole, and finasteride.  Allergies:  Allergies  Allergen Reactions  . Ace Inhibitors Unknown  . Lipitor [Atorvastatin] Muscle Pain    ROS:  A 15 point review of systems was performed and pertinent positives and negatives noted in HPI   Objective:     BP 139/79   Pulse 62   Ht 168.3 cm (5' 6.25")   Wt 78.9 kg (174 lb)   BMI 27.87 kg/m   Constitutional :  alert, appears stated age, cooperative and no distress  Lymphatics/Throat:  non-tender, firm, immobile nodule x2, size range 3cm -5cm in left side of neck, posterior scalne area, concerning for lymphadenopathy  Respiratory:  clear to auscultation bilaterally  Cardiovascular:  regular rate and rhythm  Gastrointestinal: soft, non-tender; bowel sounds normal; no masses,  no organomegaly.    Musculoskeletal: Steady  gait and movement  Skin: Cool and moist. No obvious lesions, tumors on head or neck  Psychiatric: Normal affect, non-agitated, not confused       LABS:  n/a   RADS: n/a  Assessment:      Left cervical lymphadenopathy [R59.0], no obvious cause reported, except for questionable night sweats.  Recent bloodwork WNL.  Recommend excisional biopsy for definitive diagnosis  Plan:     1. Left cervical lymphadenopathy [R59.0] Discussed surgical excision.  Alternatives include continued observation.  Benefits include possible symptom relief, pathologic evaluation, improved cosmesis. Discussed the risk of surgery including recurrence, chronic pain, post-op infxn, poor cosmesis, poor/delayed wound healing, and possible re-operation to address said risks. The risks of general anesthetic, if used, includes MI, CVA, sudden death or even reaction to anesthetic medications also discussed.  Typical post-op recovery time of 3-5 days with possible activity restrictions were also discussed.  The patient verbalized understanding and all questions were answered to the patient's satisfaction.  2. Patient has elected to proceed with surgical treatment. Procedure will be scheduled.  Written consent was obtained.

## 2020-12-21 ENCOUNTER — Encounter
Admission: RE | Admit: 2020-12-21 | Discharge: 2020-12-21 | Disposition: A | Discharge status: Home / Self Care | Payer: Medicare HMO | Source: Ambulatory Visit | Attending: Surgery | Admitting: Surgery

## 2020-12-21 ENCOUNTER — Other Ambulatory Visit: Payer: Self-pay

## 2020-12-21 ENCOUNTER — Encounter: Payer: Self-pay | Admitting: Surgery

## 2020-12-21 DIAGNOSIS — I440 Atrioventricular block, first degree: Secondary | ICD-10-CM | POA: Insufficient documentation

## 2020-12-21 DIAGNOSIS — Z01818 Encounter for other preprocedural examination: Secondary | ICD-10-CM | POA: Insufficient documentation

## 2020-12-21 DIAGNOSIS — I252 Old myocardial infarction: Secondary | ICD-10-CM | POA: Insufficient documentation

## 2020-12-21 DIAGNOSIS — I10 Essential (primary) hypertension: Secondary | ICD-10-CM | POA: Diagnosis not present

## 2020-12-21 HISTORY — DX: Essential (primary) hypertension: I10

## 2020-12-21 HISTORY — DX: Malignant (primary) neoplasm, unspecified: C80.1

## 2020-12-21 HISTORY — DX: Gastro-esophageal reflux disease without esophagitis: K21.9

## 2020-12-21 HISTORY — DX: Unspecified osteoarthritis, unspecified site: M19.90

## 2020-12-21 NOTE — Patient Instructions (Addendum)
Your procedure is scheduled on:12-30-20 FRIDAY Report to the Registration Desk on the 1st floor of the [facility]-Then proceed to the 2nd floor Surgery Desk in the [facility] To find out your arrival time, please call [phone] between 1PM - 3PM on:12-29-20 THURSDAY  REMEMBER: Instructions that are not followed completely may result in serious medical risk, up to and including death; or upon the discretion of your surgeon and anesthesiologist your surgery may need to be rescheduled.  Do not eat food after midnight the night before surgery.  No gum chewing, lozengers or hard candies.  You may however, drink CLEAR liquids up to 2 hours before you are scheduled to arrive for your surgery. Do not drink anything within 2 hours of your scheduled arrival time.  Clear liquids include: - water  - apple juice without pulp - gatorade - black coffee or tea (Do NOT add milk or creamers to the coffee or tea) Do NOT drink anything that is not on this list.  TAKE THESE MEDICATIONS THE MORNING OF SURGERY WITH A SIP OF WATER: -PROSCAR (FINASTERIDE) -METOPROLOL (TOPROL) -PRAVASTATIN (PRAVACHOL) -PRILOSEC (OMEPRAZOLE)-take one the night before and one on the morning of surgery - helps to prevent nausea after surgery.) -YOU MAY TAKE AN OXYCODONE FOR PAIN IF NEEDED THE MORNING OF SURGERY  Follow recommendations from Cardiologist, Pulmonologist or PCP regarding stopping Aspirin, Coumadin, Plavix, Eliquis, Pradaxa, or Pletal-INSTRUCTED BY DR SAKAI'S OFFICE TO STOP ASPIRIN 81 MG 5 DAYS PRIOR TO SURGERY-LAST DOSE ON 12-24-20 SATURDAY  One week prior to surgery: Stop Anti-inflammatories (NSAIDS) such as Advil, Aleve, Ibuprofen, Motrin, Naproxen, Naprosyn and Aspirin based products such as Excedrin, Goodys Powder, BC Powder-OK TO TAKE TYLENOL/OXYCODONE IF NEEDED  Stop ANY OVER THE COUNTER supplements until after surgery-STOP YOUR GARLIC NOW-YOU MAY RESUME AFTER SURGERY (However, you may continue taking  your Potassium up until the day before surgery.)  No Alcohol for 24 hours before or after surgery.  No Smoking including e-cigarettes for 24 hours prior to surgery.  No chewable tobacco products for at least 6 hours prior to surgery.  No nicotine patches on the day of surgery.  Do not use any "recreational" drugs for at least a week prior to your surgery.  Please be advised that the combination of cocaine and anesthesia may have negative outcomes, up to and including death. If you test positive for cocaine, your surgery will be cancelled.  On the morning of surgery brush your teeth with toothpaste and water, you may rinse your mouth with mouthwash if you wish. Do not swallow any toothpaste or mouthwash.  Do not wear jewelry, make-up, hairpins, clips or nail polish.  Do not wear lotions, powders, or perfumes.   Do not shave body from the neck down 48 hours prior to surgery just in case you cut yourself which could leave a site for infection.  Also, freshly shaved skin may become irritated if using the CHG soap.  Contact lenses, hearing aids and dentures may not be worn into surgery.  Do not bring valuables to the hospital. [facility] is not responsible for any missing/lost belongings or valuables.   Use CHG Soap as directed on instruction sheet.  Notify your doctor if there is any change in your medical condition (cold, fever, infection).  Wear comfortable clothing (specific to your surgery type) to the hospital.  Plan for stool softeners for home use; pain medications have a tendency to cause constipation. You can also help prevent constipation by eating foods high in  fiber such as fruits and vegetables and drinking plenty of fluids as your diet allows.  After surgery, you can help prevent lung complications by doing breathing exercises.  Take deep breaths and cough every 1-2 hours. Your doctor may order a device called an Incentive Spirometer to help you take deep breaths. When  coughing or sneezing, hold a pillow firmly against your incision with both hands. This is called "splinting." Doing this helps protect your incision. It also decreases belly discomfort.  If you are being admitted to the hospital overnight, leave your suitcase in the car. After surgery it may be brought to your room.  If you are being discharged the day of surgery, you will not be allowed to drive home. You will need a responsible adult (18 years or older) to drive you home and stay with you that night.   If you are taking public transportation, you will need to have a responsible adult (18 years or older) with you. Please confirm with your physician that it is acceptable to use public transportation.   Please call the [facility] at [phone] if you have any questions about these instructions.  Visitation Policy:  Patients undergoing a surgery or procedure may have one family member or support person with them as long as that person is not COVID-19 positive or experiencing its symptoms.  That person may remain in the waiting area during the procedure.  Inpatient Visitation:    Visiting hours are 7 a.m. to 8 p.m. Patients will be allowed one visitor. The visitor may change daily. The visitor must pass COVID-19 screenings, use hand sanitizer when entering and exiting the patient's room and wear a mask at all times, including in the patient's room. Patients must also wear a mask when staff or their visitor are in the room. Masking is required regardless of vaccination status. Systemwide, no visitors 17 or younger.

## 2020-12-22 ENCOUNTER — Encounter
Admission: RE | Admit: 2020-12-22 | Discharge: 2020-12-22 | Disposition: A | Discharge status: Home / Self Care | Payer: Medicare HMO | Source: Ambulatory Visit | Attending: Surgery | Admitting: Surgery

## 2020-12-22 DIAGNOSIS — Z01818 Encounter for other preprocedural examination: Secondary | ICD-10-CM | POA: Insufficient documentation

## 2020-12-22 DIAGNOSIS — I10 Essential (primary) hypertension: Secondary | ICD-10-CM | POA: Insufficient documentation

## 2020-12-22 DIAGNOSIS — I440 Atrioventricular block, first degree: Secondary | ICD-10-CM | POA: Diagnosis not present

## 2020-12-22 DIAGNOSIS — I252 Old myocardial infarction: Secondary | ICD-10-CM | POA: Insufficient documentation

## 2020-12-28 ENCOUNTER — Other Ambulatory Visit
Admission: RE | Admit: 2020-12-28 | Discharge: 2020-12-28 | Disposition: A | Discharge status: Home / Self Care | Payer: Medicare HMO | Source: Ambulatory Visit | Attending: Surgery | Admitting: Surgery

## 2020-12-28 NOTE — Pre-Procedure Instructions (Signed)
Patient has positive Covid results on 11/24/2020 documented in [place], does not need to be retested.

## 2020-12-30 ENCOUNTER — Encounter: Payer: Self-pay | Admitting: Surgery

## 2020-12-30 ENCOUNTER — Ambulatory Visit
Admission: RE | Admit: 2020-12-30 | Discharge: 2020-12-30 | Disposition: A | Discharge status: Home / Self Care | Payer: Medicare HMO | Attending: Surgery | Admitting: Surgery

## 2020-12-30 ENCOUNTER — Ambulatory Visit: Payer: Medicare HMO | Admitting: Urgent Care

## 2020-12-30 ENCOUNTER — Encounter
Admission: RE | Disposition: A | Discharge status: Home / Self Care | Payer: Self-pay | Source: Home / Self Care | Attending: Surgery

## 2020-12-30 ENCOUNTER — Other Ambulatory Visit: Payer: Self-pay

## 2020-12-30 DIAGNOSIS — R590 Localized enlarged lymph nodes: Secondary | ICD-10-CM

## 2020-12-30 DIAGNOSIS — Z888 Allergy status to other drugs, medicaments and biological substances status: Secondary | ICD-10-CM | POA: Diagnosis not present

## 2020-12-30 DIAGNOSIS — Z87891 Personal history of nicotine dependence: Secondary | ICD-10-CM | POA: Insufficient documentation

## 2020-12-30 DIAGNOSIS — Z8616 Personal history of COVID-19: Secondary | ICD-10-CM | POA: Diagnosis not present

## 2020-12-30 DIAGNOSIS — I10 Essential (primary) hypertension: Secondary | ICD-10-CM | POA: Diagnosis not present

## 2020-12-30 DIAGNOSIS — E3120 Multiple endocrine neoplasia [MEN] syndrome, unspecified: Secondary | ICD-10-CM | POA: Diagnosis not present

## 2020-12-30 DIAGNOSIS — C7A1 Malignant poorly differentiated neuroendocrine tumors: Secondary | ICD-10-CM | POA: Insufficient documentation

## 2020-12-30 DIAGNOSIS — K219 Gastro-esophageal reflux disease without esophagitis: Secondary | ICD-10-CM | POA: Diagnosis not present

## 2020-12-30 DIAGNOSIS — M1990 Unspecified osteoarthritis, unspecified site: Secondary | ICD-10-CM | POA: Diagnosis not present

## 2020-12-30 DIAGNOSIS — I2510 Atherosclerotic heart disease of native coronary artery without angina pectoris: Secondary | ICD-10-CM | POA: Diagnosis not present

## 2020-12-30 DIAGNOSIS — R599 Enlarged lymph nodes, unspecified: Secondary | ICD-10-CM | POA: Diagnosis not present

## 2020-12-30 HISTORY — DX: Atherosclerotic heart disease of native coronary artery without angina pectoris: I25.10

## 2020-12-30 HISTORY — PX: LYMPH NODE BIOPSY: SHX201

## 2020-12-30 SURGERY — LYMPH NODE BIOPSY
Anesthesia: General | Site: Neck | Laterality: Left

## 2020-12-30 MED ORDER — CEFAZOLIN SODIUM-DEXTROSE 2-4 GM/100ML-% IV SOLN: 2.0000 g | INTRAVENOUS | Status: DC

## 2020-12-30 MED ORDER — LACTATED RINGERS IV SOLN: INTRAVENOUS | Status: DC

## 2020-12-30 MED ORDER — SUGAMMADEX SODIUM 500 MG/5ML IV SOLN
INTRAVENOUS | Status: DC | PRN
  Administered 2020-12-30: 500 mg via INTRAVENOUS

## 2020-12-30 MED ORDER — PROPOFOL 10 MG/ML IV BOLUS
INTRAVENOUS | Status: DC | PRN
  Administered 2020-12-30: 120 mg via INTRAVENOUS

## 2020-12-30 MED ORDER — PROMETHAZINE HCL 25 MG/ML IJ SOLN: 6.2500 mg | INTRAMUSCULAR | Status: DC | PRN

## 2020-12-30 MED ORDER — MEPERIDINE HCL 50 MG/ML IJ SOLN: 6.2500 mg | INTRAMUSCULAR | Status: DC | PRN

## 2020-12-30 MED ORDER — DEXAMETHASONE SODIUM PHOSPHATE 10 MG/ML IJ SOLN
INTRAMUSCULAR | Status: DC | PRN
  Administered 2020-12-30: 10 mg via INTRAVENOUS

## 2020-12-30 MED ORDER — PHENYLEPHRINE HCL (PRESSORS) 10 MG/ML IV SOLN
INTRAVENOUS | Status: DC | PRN
  Administered 2020-12-30: 100 ug via INTRAVENOUS

## 2020-12-30 MED ORDER — METOPROLOL TARTRATE 5 MG/5ML IV SOLN
INTRAVENOUS | Status: DC | PRN
  Administered 2020-12-30: 2 mg via INTRAVENOUS

## 2020-12-30 MED ORDER — OXYCODONE HCL 5 MG PO TABS: 5.0000 mg | ORAL_TABLET | Freq: Once | ORAL | Status: DC | PRN

## 2020-12-30 MED ORDER — LACTATED RINGERS IV SOLN: INTRAVENOUS | Status: DC | PRN

## 2020-12-30 MED ORDER — FENTANYL CITRATE (PF) 100 MCG/2ML IJ SOLN
INTRAMUSCULAR | Status: AC
  Filled 2020-12-30: qty 2

## 2020-12-30 MED ORDER — IBUPROFEN 800 MG PO TABS: 800.0000 mg | ORAL_TABLET | Freq: Three times a day (TID) | ORAL | 0 refills | Status: DC | PRN

## 2020-12-30 MED ORDER — OXYCODONE HCL 5 MG/5ML PO SOLN: 5.0000 mg | Freq: Once | ORAL | Status: DC | PRN

## 2020-12-30 MED ORDER — CEFAZOLIN SODIUM-DEXTROSE 2-4 GM/100ML-% IV SOLN
INTRAVENOUS | Status: AC
  Filled 2020-12-30: qty 100

## 2020-12-30 MED ORDER — FENTANYL CITRATE (PF) 100 MCG/2ML IJ SOLN
INTRAMUSCULAR | Status: DC | PRN
  Administered 2020-12-30: 50 ug via INTRAVENOUS

## 2020-12-30 MED ORDER — ACETAMINOPHEN 10 MG/ML IV SOLN
INTRAVENOUS | Status: AC
  Filled 2020-12-30: qty 100

## 2020-12-30 MED ORDER — MIDAZOLAM HCL 2 MG/2ML IJ SOLN
INTRAMUSCULAR | Status: AC
  Filled 2020-12-30: qty 2

## 2020-12-30 MED ORDER — CHLORHEXIDINE GLUCONATE CLOTH 2 % EX PADS: 6.0000 | MEDICATED_PAD | Freq: Once | CUTANEOUS | Status: DC

## 2020-12-30 MED ORDER — LIDOCAINE HCL (CARDIAC) PF 100 MG/5ML IV SOSY
PREFILLED_SYRINGE | INTRAVENOUS | Status: DC | PRN
  Administered 2020-12-30: 100 mg via INTRAVENOUS

## 2020-12-30 MED ORDER — PROPOFOL 10 MG/ML IV BOLUS
INTRAVENOUS | Status: AC
  Filled 2020-12-30: qty 20

## 2020-12-30 MED ORDER — FENTANYL CITRATE (PF) 100 MCG/2ML IJ SOLN: 25.0000 ug | INTRAMUSCULAR | Status: DC | PRN

## 2020-12-30 MED ORDER — DOCUSATE SODIUM 100 MG PO CAPS: 100.0000 mg | ORAL_CAPSULE | Freq: Two times a day (BID) | ORAL | 0 refills | Status: AC | PRN

## 2020-12-30 MED ORDER — LIDOCAINE HCL 1 % IJ SOLN
INTRAMUSCULAR | Status: DC | PRN
  Administered 2020-12-30: 1 mL

## 2020-12-30 MED ORDER — LIDOCAINE HCL (PF) 1 % IJ SOLN
INTRAMUSCULAR | Status: AC
  Filled 2020-12-30: qty 30

## 2020-12-30 MED ORDER — ACETAMINOPHEN 10 MG/ML IV SOLN
INTRAVENOUS | Status: DC | PRN
  Administered 2020-12-30: 1000 mg via INTRAVENOUS

## 2020-12-30 MED ORDER — ESMOLOL HCL 100 MG/10ML IV SOLN
INTRAVENOUS | Status: DC | PRN
  Administered 2020-12-30: 20 mg via INTRAVENOUS
  Administered 2020-12-30: 10 mg via INTRAVENOUS

## 2020-12-30 MED ORDER — ROCURONIUM BROMIDE 100 MG/10ML IV SOLN
INTRAVENOUS | Status: DC | PRN
  Administered 2020-12-30: 40 mg via INTRAVENOUS

## 2020-12-30 MED ORDER — BUPIVACAINE-EPINEPHRINE 0.5% -1:200000 IJ SOLN
INTRAMUSCULAR | Status: DC | PRN
  Administered 2020-12-30: 1 mL

## 2020-12-30 MED ORDER — PROPOFOL 500 MG/50ML IV EMUL
INTRAVENOUS | Status: AC
  Filled 2020-12-30: qty 50

## 2020-12-30 MED ORDER — CHLORHEXIDINE GLUCONATE 0.12 % MT SOLN
OROMUCOSAL | Status: AC
  Filled 2020-12-30: qty 15

## 2020-12-30 MED ORDER — BUPIVACAINE-EPINEPHRINE (PF) 0.5% -1:200000 IJ SOLN
INTRAMUSCULAR | Status: AC
  Filled 2020-12-30: qty 90

## 2020-12-30 MED ORDER — ONDANSETRON HCL 4 MG/2ML IJ SOLN
INTRAMUSCULAR | Status: DC | PRN
Start: 2020-12-30 — End: 2020-12-30
  Administered 2020-12-30: 4 mg via INTRAVENOUS

## 2020-12-30 MED ORDER — GLYCOPYRROLATE 0.2 MG/ML IJ SOLN
INTRAMUSCULAR | Status: DC | PRN
  Administered 2020-12-30: .2 mg via INTRAVENOUS

## 2020-12-30 MED ORDER — CHLORHEXIDINE GLUCONATE 0.12 % MT SOLN
15.0000 mL | Freq: Once | OROMUCOSAL | Status: AC
  Administered 2020-12-30: 15 mL via OROMUCOSAL

## 2020-12-30 MED ORDER — ACETAMINOPHEN 325 MG PO TABS: 650.0000 mg | ORAL_TABLET | Freq: Three times a day (TID) | ORAL | 0 refills | Status: AC | PRN

## 2020-12-30 MED ORDER — ORAL CARE MOUTH RINSE: 15.0000 mL | Freq: Once | OROMUCOSAL | Status: AC

## 2020-12-30 SURGICAL SUPPLY — 35 items
ADH SKN CLS APL DERMABOND .7 (GAUZE/BANDAGES/DRESSINGS) ×1
APL PRP STRL LF DISP 70% ISPRP (MISCELLANEOUS) ×1
BLADE SURG 15 STRL LF DISP TIS (BLADE) ×1 IMPLANT
BLADE SURG 15 STRL SS (BLADE) ×2
CANISTER SUCT 1200ML W/VALVE (MISCELLANEOUS) IMPLANT
CHLORAPREP W/TINT 26 (MISCELLANEOUS) ×2 IMPLANT
CNTNR SPEC 2.5X3XGRAD LEK (MISCELLANEOUS) ×1
CONT SPEC 4OZ STER OR WHT (MISCELLANEOUS) ×1
CONT SPEC 4OZ STRL OR WHT (MISCELLANEOUS) ×1
CONTAINER SPEC 2.5X3XGRAD LEK (MISCELLANEOUS) ×1 IMPLANT
COVER WAND RF STERILE (DRAPES) IMPLANT
DERMABOND ADVANCED (GAUZE/BANDAGES/DRESSINGS) ×1
DERMABOND ADVANCED .7 DNX12 (GAUZE/BANDAGES/DRESSINGS) ×1 IMPLANT
DRAPE LAPAROTOMY 77X122 PED (DRAPES) ×2 IMPLANT
DRSG TELFA 4X3 1S NADH ST (GAUZE/BANDAGES/DRESSINGS) IMPLANT
ELECT CAUTERY BLADE 6.4 (BLADE) ×2 IMPLANT
ELECT REM PT RETURN 9FT ADLT (ELECTROSURGICAL) ×2
ELECTRODE REM PT RTRN 9FT ADLT (ELECTROSURGICAL) ×1 IMPLANT
GLOVE SURG SYN 6.5 ES PF (GLOVE) ×2 IMPLANT
GLOVE SURG UNDER POLY LF SZ7 (GLOVE) ×2 IMPLANT
GOWN STRL REUS W/ TWL LRG LVL3 (GOWN DISPOSABLE) ×2 IMPLANT
GOWN STRL REUS W/TWL LRG LVL3 (GOWN DISPOSABLE) ×4
KIT TURNOVER KIT A (KITS) ×2 IMPLANT
LABEL OR SOLS (LABEL) ×2 IMPLANT
MANIFOLD NEPTUNE II (INSTRUMENTS) ×2 IMPLANT
NEEDLE HYPO 22GX1.5 SAFETY (NEEDLE) ×2 IMPLANT
NS IRRIG 500ML POUR BTL (IV SOLUTION) ×2 IMPLANT
PACK BASIN MINOR ARMC (MISCELLANEOUS) ×2 IMPLANT
SUT MNCRL 4-0 (SUTURE) ×2
SUT MNCRL 4-0 27XMFL (SUTURE) ×1
SUT VIC AB 3-0 SH 27 (SUTURE) ×2
SUT VIC AB 3-0 SH 27X BRD (SUTURE) ×1 IMPLANT
SUTURE MNCRL 4-0 27XMF (SUTURE) ×1 IMPLANT
SYR 10ML LL (SYRINGE) ×2 IMPLANT
SYR BULB IRRIG 60ML STRL (SYRINGE) ×2 IMPLANT

## 2020-12-30 NOTE — OR Nursing (Signed)
Per OR #6 staff, per Dr. Lysle Pearl, patient may resume taking aspirin in two days.  Added to d/c instructions/med section.

## 2020-12-30 NOTE — Anesthesia Preprocedure Evaluation (Signed)
Anesthesia Evaluation  Patient identified by MRN, date of birth, ID band Patient awake    Reviewed: Allergy & Precautions, NPO status , Patient's Chart, lab work & pertinent test results  History of Anesthesia Complications Negative for: history of anesthetic complications  Airway Mallampati: II  TM Distance: >3 FB Neck ROM: Full    Dental  (+) Edentulous Upper, Edentulous Lower   Pulmonary neg sleep apnea, neg COPD, former smoker,    breath sounds clear to auscultation- rhonchi (-) wheezing      Cardiovascular Exercise Tolerance: Good hypertension, Pt. on medications (-) angina+ CAD, + Past MI and + Cardiac Stents (2006)   Rhythm:Regular Rate:Normal - Systolic murmurs and - Diastolic murmurs    Neuro/Psych neg Seizures negative neurological ROS  negative psych ROS   GI/Hepatic Neg liver ROS, GERD  ,  Endo/Other  negative endocrine ROSneg diabetes  Renal/GU negative Renal ROS     Musculoskeletal  (+) Arthritis ,   Abdominal (+) + obese,   Peds  Hematology negative hematology ROS (+)   Anesthesia Other Findings Past Medical History: No date: Arthritis No date: CAD (coronary artery disease)     Comment:  PCI in 2006; DES x 2 to 75% lesion in mRCA and 100%               lesion in dRCA  No date: Cancer ([place])     Comment:  SKIN CA No date: GERD (gastroesophageal reflux disease) No date: Hypertension 02/2005: Myocardial infarction ([facility])     Comment:  inferolateral; PCI with DES placement x 2   Reproductive/Obstetrics                             Anesthesia Physical Anesthesia Plan  ASA: III  Anesthesia Plan: General   Post-op Pain Management:    Induction: Intravenous  PONV Risk Score and Plan: 1 and Ondansetron  Airway Management Planned: Oral ETT  Additional Equipment:   Intra-op Plan:   Post-operative Plan: Extubation in OR  Informed Consent: I have reviewed the  patients History and Physical, chart, labs and discussed the procedure including the risks, benefits and alternatives for the proposed anesthesia with the patient or authorized representative who has indicated his/her understanding and acceptance.     Dental advisory given  Plan Discussed with: CRNA and Anesthesiologist  Anesthesia Plan Comments:         Anesthesia Quick Evaluation

## 2020-12-30 NOTE — Anesthesia Procedure Notes (Signed)
Procedure Name: Intubation Performed by: Fletcher-Harrison, Falon Flinchum, CRNA Pre-anesthesia Checklist: Patient identified, Emergency Drugs available, Suction available and Patient being monitored Patient Re-evaluated:Patient Re-evaluated prior to induction Oxygen Delivery Method: Circle system utilized Preoxygenation: Pre-oxygenation with 100% oxygen Induction Type: IV induction Ventilation: Mask ventilation without difficulty Laryngoscope Size: McGraph and 3 Grade View: Grade I Tube type: Oral Tube size: 6.5 mm Number of attempts: 1 Airway Equipment and Method: Stylet and Oral airway Placement Confirmation: ETT inserted through vocal cords under direct vision,  positive ETCO2,  breath sounds checked- equal and bilateral and CO2 detector Secured at: 21 cm Tube secured with: Tape Dental Injury: Teeth and Oropharynx as per pre-operative assessment        

## 2020-12-30 NOTE — Interval H&P Note (Signed)
History and Physical Interval Note:  12/30/2020 8:08 AM  Travis Marshall  has presented today for surgery, with the diagnosis of R59.0 left cervical lymphadenopathy.  The various methods of treatment have been discussed with the patient and family. After consideration of risks, benefits and other options for treatment, the patient has consented to  Procedure(s): LYMPH NODE BIOPSY (N/A) as a surgical intervention.  The patient's history has been reviewed, patient examined, no change in status, stable for surgery.  I have reviewed the patient's chart and labs.  Questions were answered to the patient's satisfaction.     Kaegan Hettich Lysle Pearl

## 2020-12-30 NOTE — Transfer of Care (Signed)
Immediate Anesthesia Transfer of Care Note  Patient: OSBORN PULLIN  Procedure(s) Performed: LYMPH NODE BIOPSY (Left Neck)  Patient Location: PACU  Anesthesia Type:General  Level of Consciousness: drowsy and patient cooperative  Airway & Oxygen Therapy: Patient Spontanous Breathing and Patient connected to face mask oxygen  Post-op Assessment: Report given to RN and Post -op Vital signs reviewed and stable  Post vital signs: Reviewed and stable  Last Vitals:  Vitals Value Taken Time  BP 142/93 12/30/20 0933  Temp    Pulse 72 12/30/20 0938  Resp 19 12/30/20 0938  SpO2 100 % 12/30/20 0938  Vitals shown include unvalidated device data.  Last Pain:  Vitals:   12/30/20 0617  TempSrc: Temporal  PainSc: 0-No pain         Complications: No complications documented.

## 2020-12-30 NOTE — Interval H&P Note (Signed)
History and Physical Interval Note:  12/30/2020 7:55 AM  Travis Marshall  has presented today for surgery, with the diagnosis of R59.0 left cervical lymphadenopathy.  The various methods of treatment have been discussed with the patient and family. After consideration of risks, benefits and other options for treatment, the patient has consented to  Procedure(s): LYMPH NODE BIOPSY (N/A) as a surgical intervention.  The patient's history has been reviewed, patient examined, no change in status, stable for surgery.  I have reviewed the patient's chart and labs.  Questions were answered to the patient's satisfaction.     Dwaine Pringle Lysle Pearl

## 2020-12-30 NOTE — Discharge Instructions (Addendum)
Lymph node Removal, Care After This sheet gives you information about how to care for yourself after your procedure. Your health care provider may also give you more specific instructions. If you have problems or questions, contact your health care provider. What can I expect after the procedure? After the procedure, it is common to have:  Soreness.  Bruising.  Itching. Follow these instructions at home: site care Follow instructions from your health care provider about how to take care of your site. Make sure you:  Wash your hands with soap and water before and after you change your bandage (dressing). If soap and water are not available, use hand sanitizer.  Leave stitches (sutures), skin glue, or adhesive strips in place. These skin closures may need to stay in place for 2 weeks or longer. If adhesive strip edges start to loosen and curl up, you may trim the loose edges. Do not remove adhesive strips completely unless your health care provider tells you to do that.  If the area bleeds or bruises, apply gentle pressure for 10 minutes.  OK TO SHOWER IN 24HRS  Check your site every day for signs of infection. Check for:  Redness, swelling, or pain.  Fluid or blood.  Warmth.  Pus or a bad smell.  General instructions  Rest and then return to your normal activities as told by your health care provider.   tylenol and advil as needed for discomfort.  Please alternate between the two every four hours as needed for pain.     Use narcotics, if prescribed, only when tylenol and motrin is not enough to control pain.   325-650mg  every 8hrs to max of 3000mg /24hrs (including the 325mg  in every norco dose) for the tylenol.     Advil up to 800mg  per dose every 8hrs as needed for pain.    Keep all follow-up visits as told by your health care provider. This is important. Contact a health care provider if:  You have redness, swelling, or pain around your site.  You have fluid or blood  coming from your site.  Your site feels warm to the touch.  You have pus or a bad smell coming from your site.  You have a fever.  Your sutures, skin glue, or adhesive strips loosen or come off sooner than expected. Get help right away if:  You have bleeding that does not stop with pressure or a dressing. Summary  After the procedure, it is common to have some soreness, bruising, and itching at the site.  Follow instructions from your health care provider about how to take care of your site.  Check your site every day for signs of infection.  Contact a health care provider if you have redness, swelling, or pain around your site, or your site feels warm to the touch.  Keep all follow-up visits as told by your health care provider. This is important. This information is not intended to replace advice given to you by your health care provider. Make sure you discuss any questions you have with your health care provider. Document Released: 11/18/2015 Document Revised: 04/21/2018 Document Reviewed: 04/21/2018 Elsevier Interactive Patient Education  2019 [place]   1) The drugs that you were given will stay in your system until tomorrow so for the next 24 hours you should not:  A) Drive an automobile B) Make any legal decisions C) Drink any alcoholic beverage   2) You may resume regular meals tomorrow.  Today it is better to start with liquids and gradually work up to solid foods.  You may eat anything you prefer, but it is better to start with liquids, then soup and crackers, and gradually work up to solid foods.   3) Please notify your doctor immediately if you have any unusual bleeding, trouble breathing, redness and pain at the surgery site, drainage, fever, or pain not relieved by medication.    4) Additional Instructions:        Please contact your physician with any problems or Same Day Surgery at [phone],  Monday through Friday 6 am to 4 pm, or [place] at [facility] number at [phone].

## 2020-12-30 NOTE — Anesthesia Postprocedure Evaluation (Signed)
Anesthesia Post Note  Patient: Travis Marshall  Procedure(s) Performed: LYMPH NODE BIOPSY (Left Neck)  Patient location during evaluation: PACU Anesthesia Type: General Level of consciousness: awake and alert and oriented Pain management: pain level controlled Vital Signs Assessment: post-procedure vital signs reviewed and stable Respiratory status: spontaneous breathing, nonlabored ventilation and respiratory function stable Cardiovascular status: blood pressure returned to baseline and stable Postop Assessment: no signs of nausea or vomiting Anesthetic complications: no   No complications documented.   Last Vitals:  Vitals:   12/30/20 1018 12/30/20 1051  BP: 120/64 129/66  Pulse: 68 67  Resp: 18 18  Temp: (!) 36.2 C   SpO2: 93% 96%    Last Pain:  Vitals:   12/30/20 1051  TempSrc:   PainSc: 0-No pain                 Amy Penwarden

## 2020-12-31 NOTE — Op Note (Signed)
Pre-Op Dx: superficial cervical lymphadenopathy Post-Op Dx: same Anesthesia: GETA EBL: 82LM Complications:  none apparent Specimen:  Left superficial cervical lymphadenopathy Procedure: excisional biopsy of left superficial lymph node Surgeon: Lysle Pearl  Indications for procedure: See H&P  Description of Procedure:  Consent obtained, time out performed.  Patient placed in supine position.  Area sterilized and draped in usual position.  Local infused to area previously marked.  4cm incision made through dermis with 15blade and firmly attached to the posterior scalene fascia noted the palpable lymph nodes in subcutaneous layer.  The  2cm x 1cm x 1.5cm lymph node then removed from surrounding tissue completely using electrocautery, passed off field pending pathology. Dissection was difficult due to dense adhesions to the surrounding structure.  Due to the unexpected dense adhesions in the area and fear that further dissection to remove the remaining palpable nodes could cause bleeding complications for low yield for additional diagnostic certainty, it was decided to forego any additional dissection of remaining nodes.     Wound hemostasis noted, then closed in two layer fashion with 3-0 vicryl in interrupted fashion for deep dermal layer, then running 4-0 monocryl in subcuticular fashion for epidermal layer.  Wound then dressed with dermabond.  Pt tolerated procedure well, and transferred to PACU in stable condition. Sponge and instrument count correct at end of procedure.

## 2021-01-04 ENCOUNTER — Encounter: Payer: Self-pay | Admitting: Surgery

## 2021-01-05 ENCOUNTER — Other Ambulatory Visit: Payer: Self-pay | Admitting: Anatomic Pathology & Clinical Pathology

## 2021-01-05 ENCOUNTER — Encounter: Payer: Self-pay | Admitting: Surgery

## 2021-01-05 LAB — SURGICAL PATHOLOGY

## 2021-01-09 DIAGNOSIS — C7802 Secondary malignant neoplasm of left lung: Secondary | ICD-10-CM | POA: Insufficient documentation

## 2021-01-09 DIAGNOSIS — C7A1 Malignant poorly differentiated neuroendocrine tumors: Secondary | ICD-10-CM | POA: Insufficient documentation

## 2021-01-10 ENCOUNTER — Other Ambulatory Visit: Payer: Self-pay

## 2021-01-10 ENCOUNTER — Encounter: Payer: Self-pay | Admitting: Oncology

## 2021-01-10 ENCOUNTER — Ambulatory Visit
Admission: RE | Admit: 2021-01-10 | Discharge: 2021-01-10 | Disposition: A | Discharge status: Home / Self Care | Payer: Medicare HMO | Source: Ambulatory Visit | Attending: Oncology | Admitting: Oncology

## 2021-01-10 ENCOUNTER — Inpatient Hospital Stay: Payer: Medicare HMO

## 2021-01-10 ENCOUNTER — Inpatient Hospital Stay: Payer: Medicare HMO | Attending: Oncology | Admitting: Oncology

## 2021-01-10 VITALS — BP 130/77 | HR 55 | Temp 96.3°F | Resp 16 | Wt 169.6 lb

## 2021-01-10 DIAGNOSIS — Z87891 Personal history of nicotine dependence: Secondary | ICD-10-CM | POA: Insufficient documentation

## 2021-01-10 DIAGNOSIS — R634 Abnormal weight loss: Secondary | ICD-10-CM | POA: Diagnosis not present

## 2021-01-10 DIAGNOSIS — Z79899 Other long term (current) drug therapy: Secondary | ICD-10-CM | POA: Diagnosis not present

## 2021-01-10 DIAGNOSIS — Z809 Family history of malignant neoplasm, unspecified: Secondary | ICD-10-CM | POA: Insufficient documentation

## 2021-01-10 DIAGNOSIS — I10 Essential (primary) hypertension: Secondary | ICD-10-CM | POA: Diagnosis not present

## 2021-01-10 DIAGNOSIS — Z86012 Personal history of benign carcinoid tumor: Secondary | ICD-10-CM | POA: Diagnosis not present

## 2021-01-10 DIAGNOSIS — R0602 Shortness of breath: Secondary | ICD-10-CM | POA: Insufficient documentation

## 2021-01-10 DIAGNOSIS — C7A1 Malignant poorly differentiated neuroendocrine tumors: Secondary | ICD-10-CM | POA: Insufficient documentation

## 2021-01-10 DIAGNOSIS — J8410 Pulmonary fibrosis, unspecified: Secondary | ICD-10-CM | POA: Diagnosis not present

## 2021-01-10 DIAGNOSIS — Z9049 Acquired absence of other specified parts of digestive tract: Secondary | ICD-10-CM | POA: Insufficient documentation

## 2021-01-10 DIAGNOSIS — Z801 Family history of malignant neoplasm of trachea, bronchus and lung: Secondary | ICD-10-CM | POA: Diagnosis not present

## 2021-01-10 DIAGNOSIS — R590 Localized enlarged lymph nodes: Secondary | ICD-10-CM | POA: Insufficient documentation

## 2021-01-10 DIAGNOSIS — J984 Other disorders of lung: Secondary | ICD-10-CM | POA: Diagnosis not present

## 2021-01-10 DIAGNOSIS — Z5111 Encounter for antineoplastic chemotherapy: Secondary | ICD-10-CM | POA: Insufficient documentation

## 2021-01-10 DIAGNOSIS — Z7189 Other specified counseling: Secondary | ICD-10-CM

## 2021-01-10 IMAGING — CR DG CHEST 2V
1 series · 2 of 2 positions shown · non-contrast
Comparison: [DATE]

CLINICAL DATA: High-grade neuroendocrine carcinoma, hypertension

EXAM:
CHEST - 2 VIEW

[Series 1: w chest pa · 0.14mm/px · 2 of 2 slices shown]
[im 1/2]
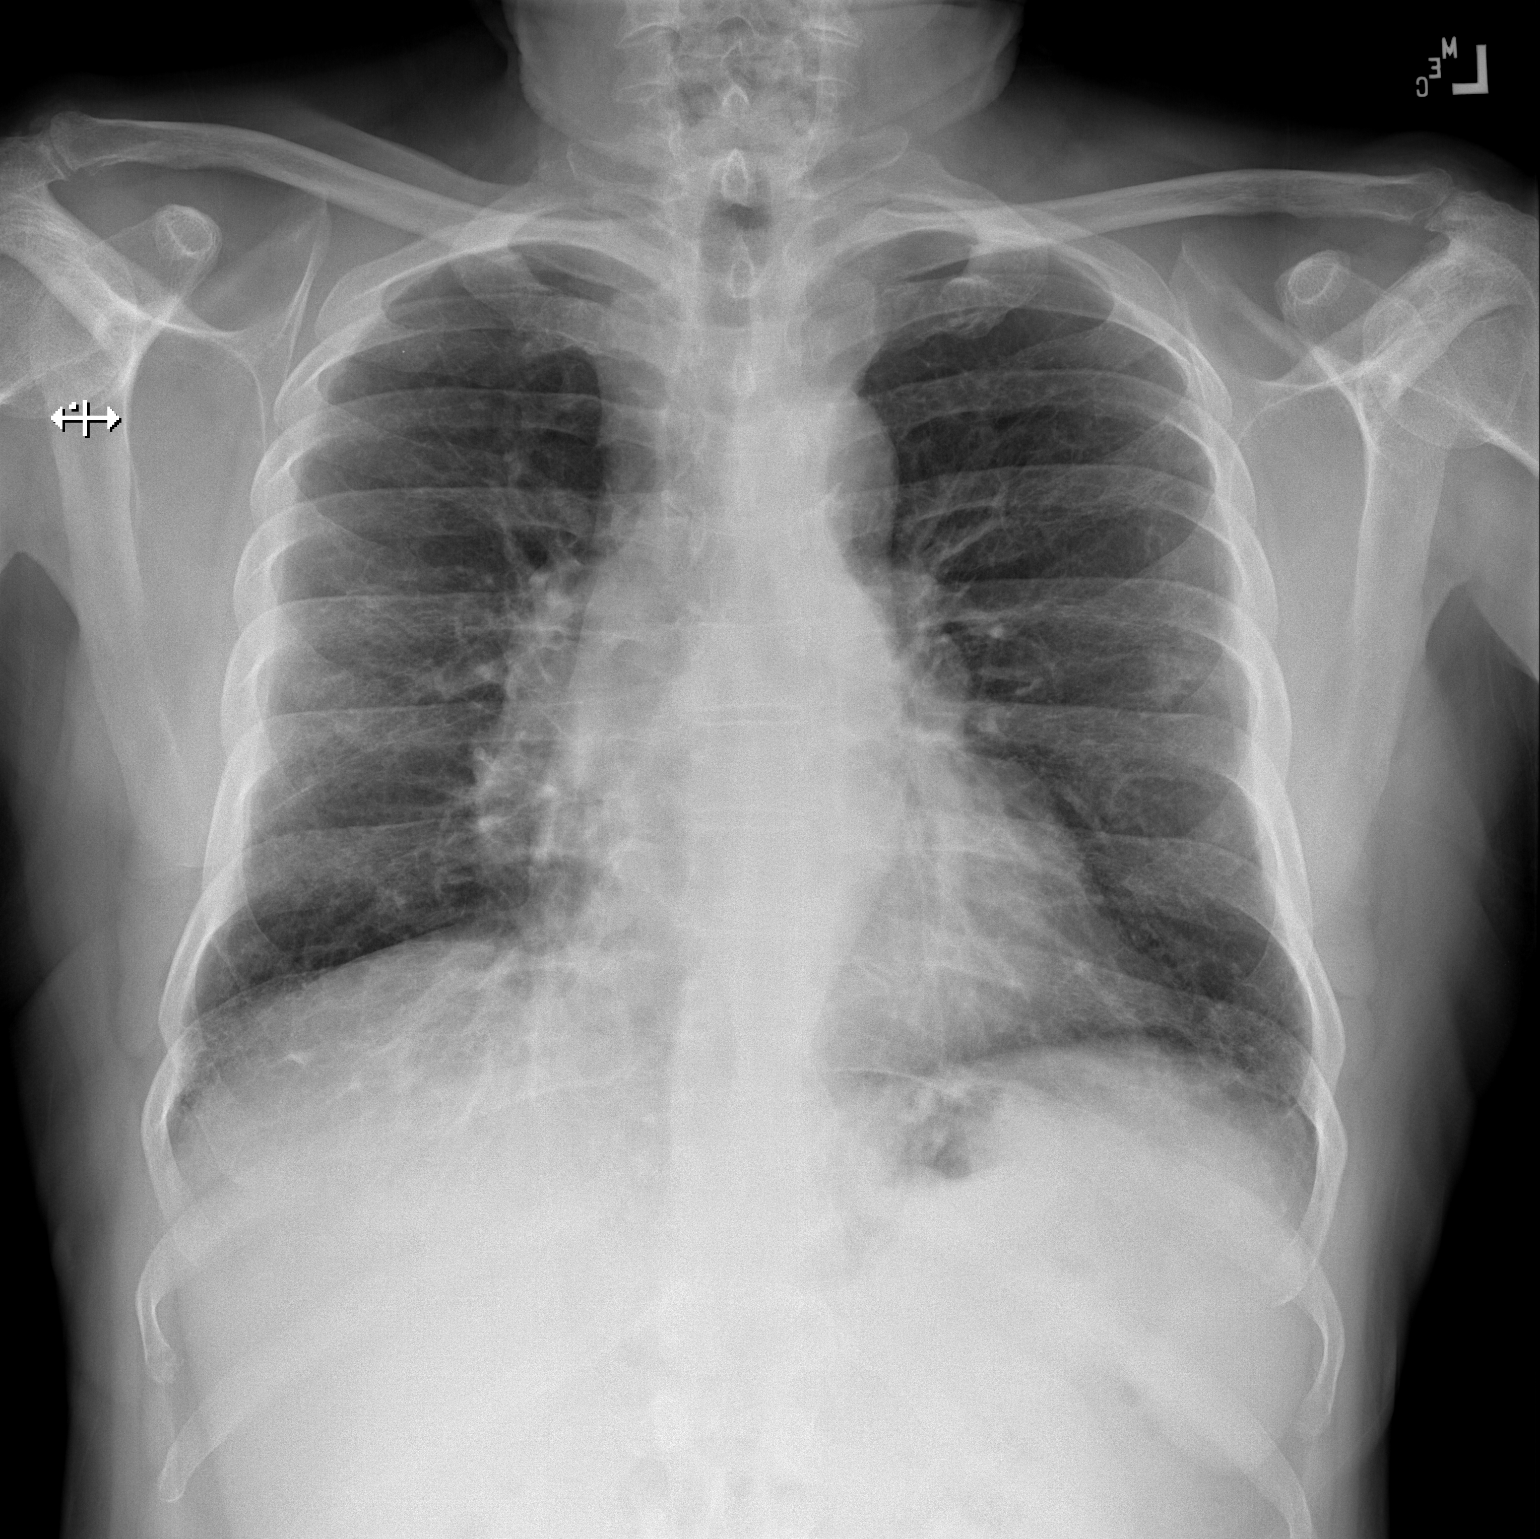
[im 2/2]
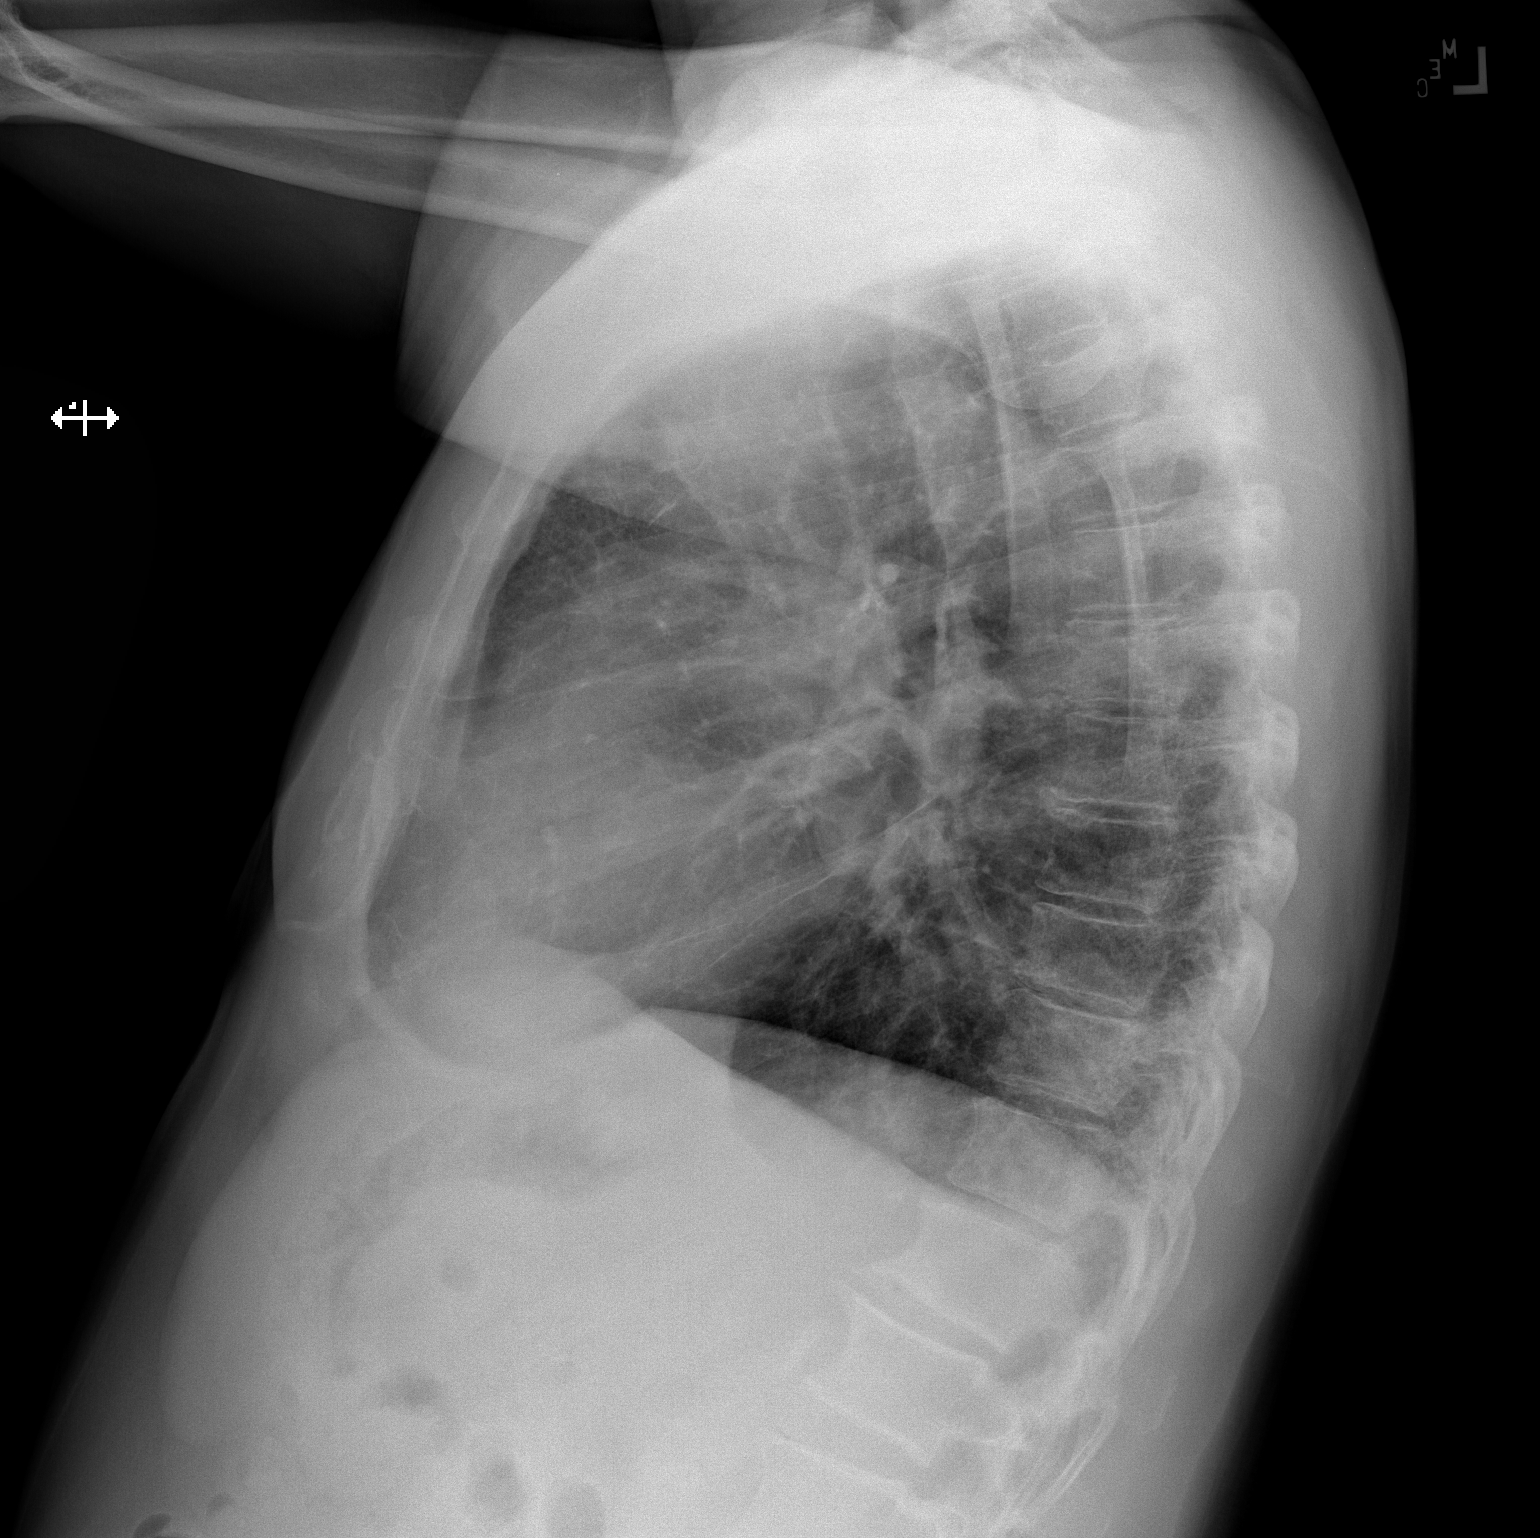

[2 of 2 positions shown; findings below may reference images not displayed]

FINDINGS: Frontal and lateral views of the chest demonstrate an unremarkable
cardiac silhouette. No acute airspace disease, effusion, or
pneumothorax. Basilar predominant scarring and fibrosis. No acute
bony abnormalities.
IMPRESSION: 1. Bibasilar scarring and fibrosis.  No acute process.
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
## 2021-01-10 NOTE — Progress Notes (Signed)
New patient evaluation.   

## 2021-01-11 DIAGNOSIS — C7A8 Other malignant neuroendocrine tumors: Secondary | ICD-10-CM | POA: Diagnosis not present

## 2021-01-19 ENCOUNTER — Other Ambulatory Visit: Payer: Medicare HMO

## 2021-01-19 NOTE — Progress Notes (Signed)
Tumor Board Documentation  NIGEL ERICSSON was presented by Dr Tasia Catchings at our Tumor Board on 01/19/2021, which included representatives from medical oncology,radiation oncology,navigation,pathology,radiology,surgical,pharmacy,genetics,research,palliative care,pulmonology.  Mikle currently presents as a new patient,for MDC,for new positive pathology with history of the following treatments: surgical intervention(s).  Additionally, we reviewed previous medical and familial history, history of present illness, and recent lab results along with all available histopathologic and imaging studies. The tumor board considered available treatment options and made the following recommendations: Additional screening (PET Scan, MRI)    The following procedures/referrals were also placed: No orders of the defined types were placed in this encounter.   Clinical Trial Status: not discussed   Staging used: To be determined  AJCC Staging:       Group: High Grade Metastatic NET   National site-specific guidelines   were discussed with respect to the case.  Tumor board is a meeting of clinicians from various specialty areas who evaluate and discuss patients for whom a multidisciplinary approach is being considered. Final determinations in the plan of care are those of the provider(s). The responsibility for follow up of recommendations given during tumor board is that of the provider.   Today's extended care, comprehensive team conference, [place]akes was not present for the discussion and was not examined.   Multidisciplinary Tumor Board is a multidisciplinary case peer review process.  Decisions discussed in the Multidisciplinary Tumor Board reflect the opinions of the specialists present at the conference without having examined the patient.  Ultimately, treatment and diagnostic decisions rest with the primary provider(s) and the patient.

## 2021-01-20 ENCOUNTER — Ambulatory Visit
Admission: RE | Admit: 2021-01-20 | Discharge: 2021-01-20 | Disposition: A | Discharge status: Home / Self Care | Payer: Medicare HMO | Source: Ambulatory Visit | Attending: Oncology | Admitting: Oncology

## 2021-01-20 ENCOUNTER — Other Ambulatory Visit: Payer: Self-pay

## 2021-01-20 DIAGNOSIS — C7931 Secondary malignant neoplasm of brain: Secondary | ICD-10-CM | POA: Diagnosis not present

## 2021-01-20 DIAGNOSIS — Z86012 Personal history of benign carcinoid tumor: Secondary | ICD-10-CM | POA: Diagnosis not present

## 2021-01-20 DIAGNOSIS — G936 Cerebral edema: Secondary | ICD-10-CM | POA: Diagnosis not present

## 2021-01-20 DIAGNOSIS — C7A1 Malignant poorly differentiated neuroendocrine tumors: Secondary | ICD-10-CM | POA: Insufficient documentation

## 2021-01-20 IMAGING — MR MR HEAD WO/W CM
14 series · 47 of 48 positions shown · IV contrast (gadavist)
Comparison: None.

CLINICAL DATA: 74-year-old male with high-grade neuroendocrine
carcinoma diagnosed last month.

EXAM:
MRI HEAD WITHOUT AND WITH CONTRAST
TECHNIQUE: Multiplanar, multiecho pulse sequences of the brain and surrounding
structures were obtained without and with intravenous contrast.
CONTRAST:  7.5mL GADAVIST GADOBUTROL 1 MMOL/ML IV SOLN

[Series 5: ax dwi_tracew · axial · 3.0mm · 0.65mm/px · z∈[-85,+69]mm · 4 of 48 slices shown]
[im 1/48]
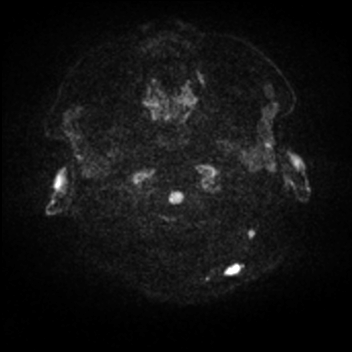
[im 16/48]
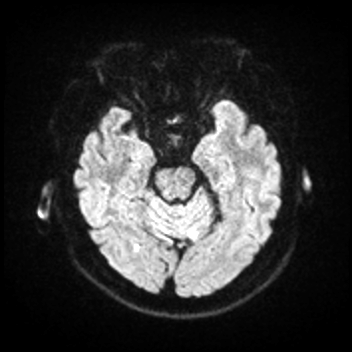
[im 32/48]
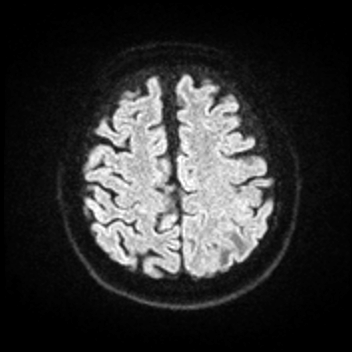
[im 48/48]
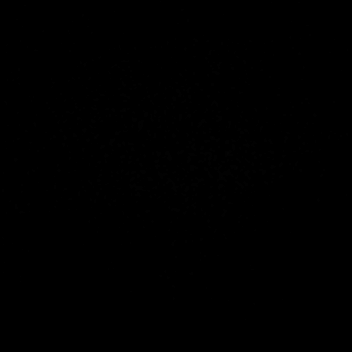

[Series 6: ax dwi_adc · axial · 3.0mm · 0.65mm/px · z∈[-85,+63]mm · 4 of 46 slices shown]
[im 1/46]
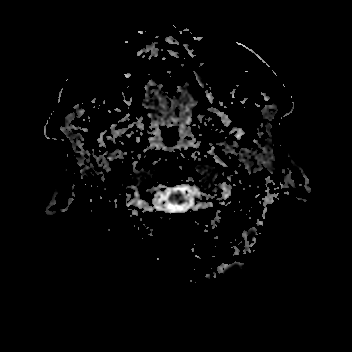
[im 16/46]
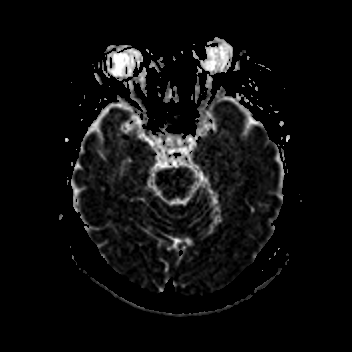
[im 31/46]
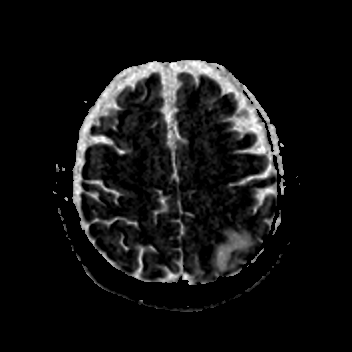
[im 46/46]
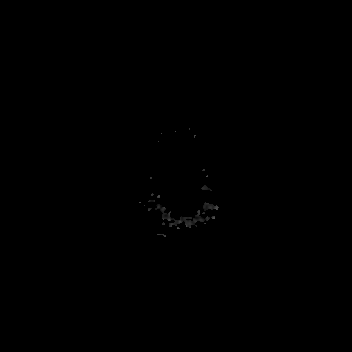

[Series 7: cor dwi_tracew · coronal · 5.0mm · 0.68mm/px · 2 of 40 slices shown]
[im 1/40]
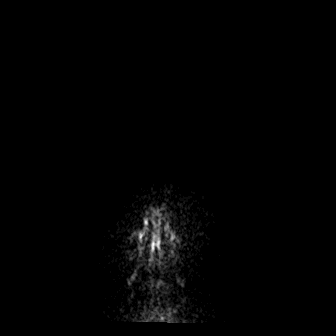
[im 40/40]
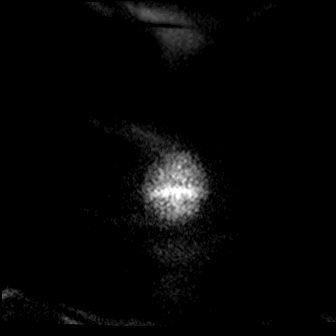

[Series 8: cor dwi_adc · coronal · 5.0mm · 0.68mm/px · 2 of 40 slices shown]
[im 1/40]
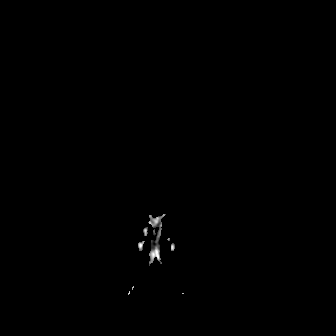
[im 40/40]
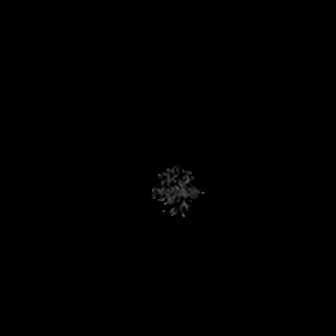

[Series 9: T1 · sagittal · 5.0mm · 0.62mm/px · 1 of 25 slices shown (1 of 2)]
[im 1/25]
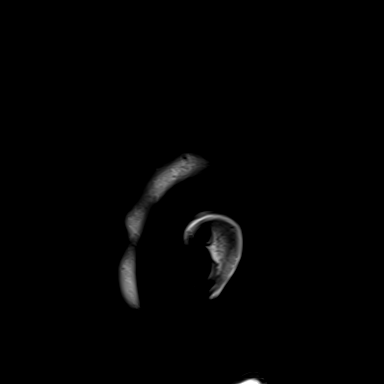

[Series 10: T2 · axial · 5.0mm · 0.53mm/px · 1 of 25 slices shown]
[im 1/25]
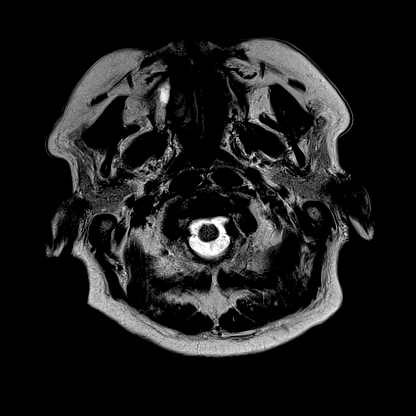

[Series 11: T1 · axial · 1.0mm · 0.98mm/px · z∈[-95,+78]mm · 9 of 173 slices shown (2 of 2)]
[im 1/173]
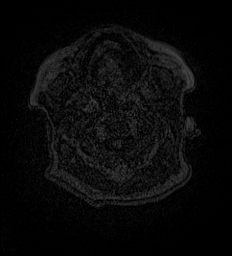
[im 20/173]
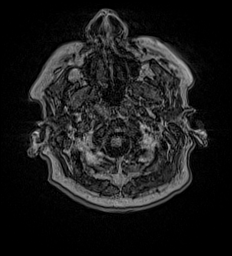
[im 39/173]
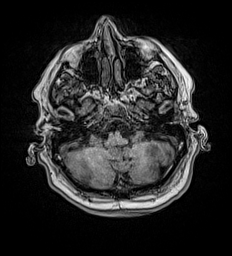
[im 58/173]
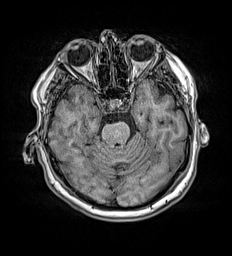
[im 77/173]
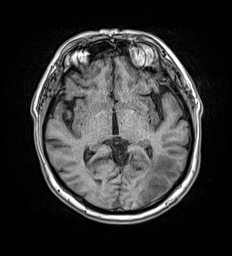
[im 96/173]
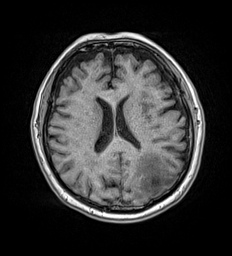
[im 115/173]
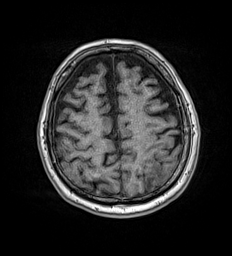
[im 153/173]
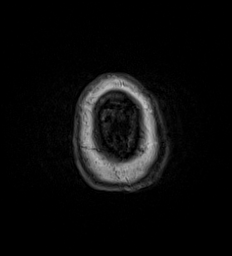
[im 173/173]
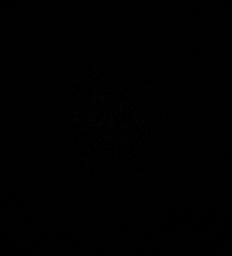

[Series 12: FLAIR · axial · 3.0mm · 0.53mm/px · z∈[-88,+73]mm · 3 of 55 slices shown]
[im 1/55]
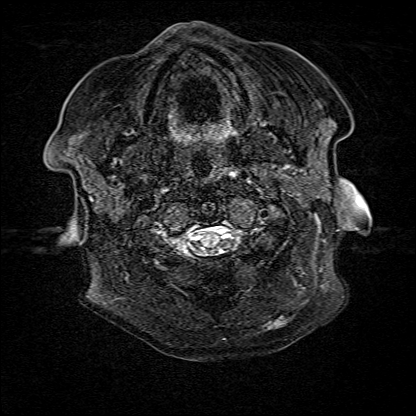
[im 28/55]
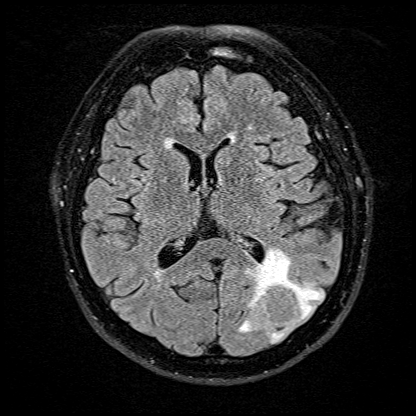
[im 55/55]
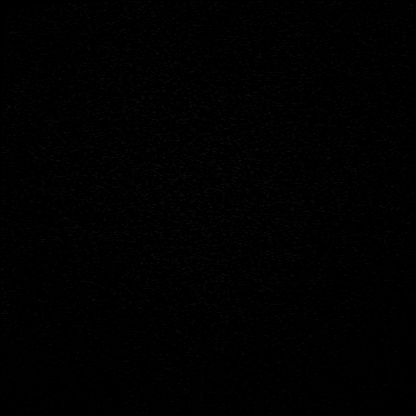

[Series 14: pha_images · axial · 3.0mm · 0.90mm/px · z∈[-96,+75]mm · 3 of 58 slices shown]
[im 1/58]
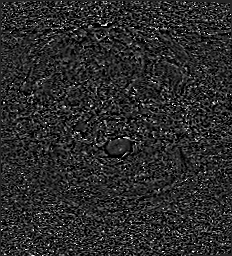
[im 29/58]
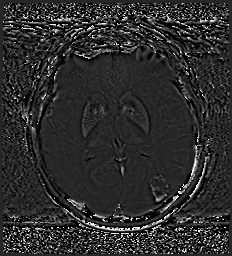
[im 58/58]
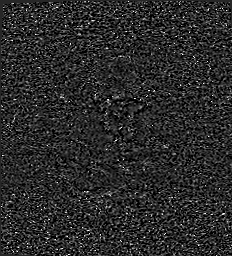

[Series 15: swi_images · axial · 3.0mm · 0.90mm/px · z∈[-96,+81]mm · 3 of 60 slices shown]
[im 1/60]
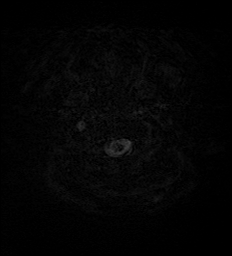
[im 30/60]
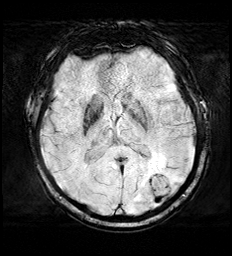
[im 60/60]
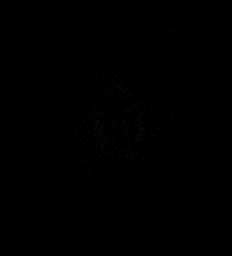

[Series 17: T2 post-contrast · coronal · 5.0mm · 0.57mm/px · 2 of 29 slices shown]
[im 1/29]
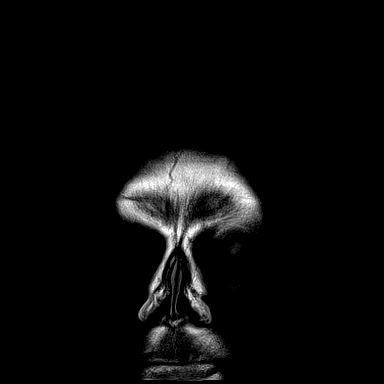
[im 29/29]
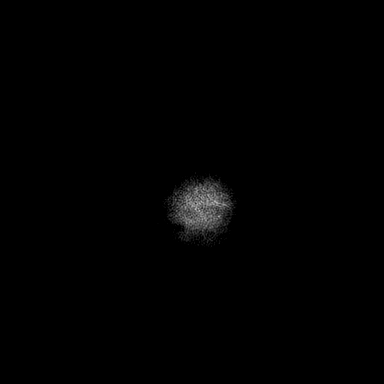

[Series 18: T1 post-contrast · axial · 1.0mm · 0.98mm/px · z∈[-95,+78]mm · 10 of 174 slices shown (1 of 3)]
[im 1/174]
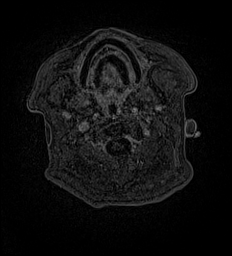
[im 20/174]
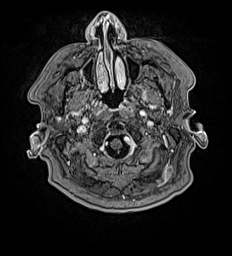
[im 39/174]
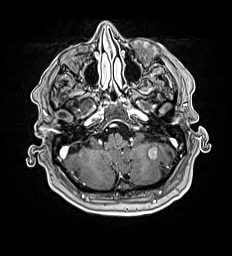
[im 58/174]
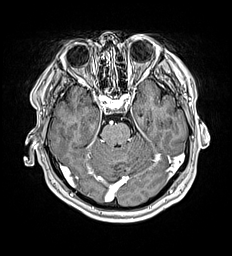
[im 77/174]
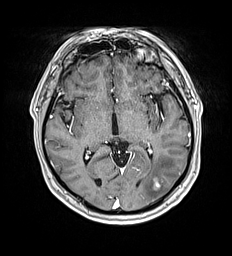
[im 97/174]
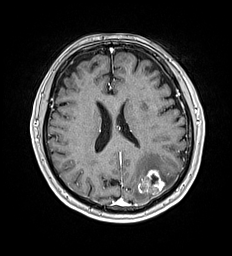
[im 116/174]
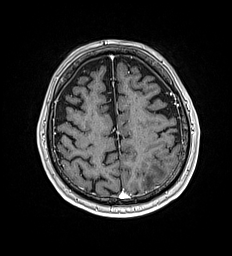
[im 135/174]
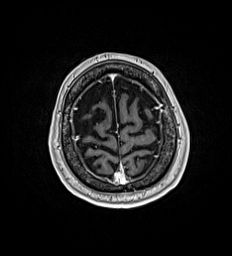
[im 154/174]
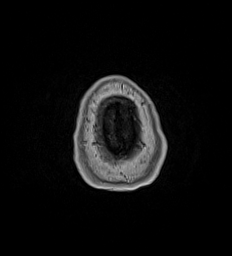
[im 174/174]
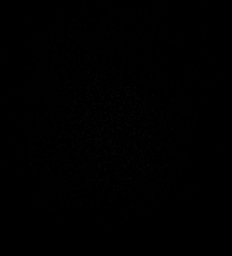

[Series 19: T1 post-contrast · coronal · 5.0mm · 0.57mm/px · 2 of 29 slices shown (2 of 3)]
[im 1/29]
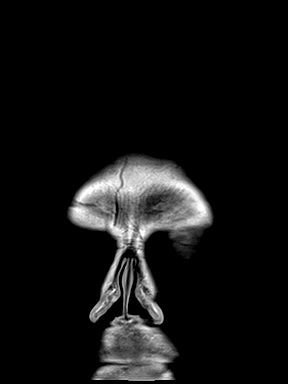
[im 29/29]
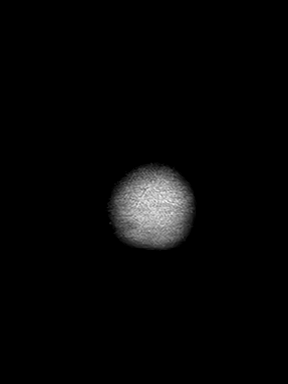

[Series 20: T1 post-contrast · sagittal · 5.0mm · 0.62mm/px · 1 of 25 slices shown (3 of 3)]
[im 1/25]
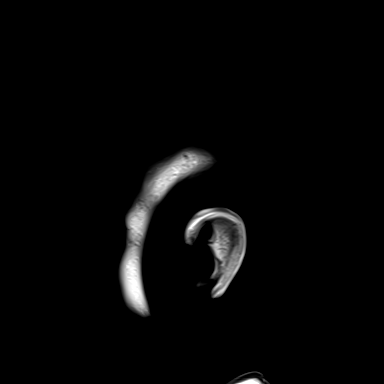

[47 of 48 positions shown; findings below may reference images not displayed]

FINDINGS: Brain:

Mildly lobulated 13 mm enhancing lesion within the inferior left
cerebellum (series 18, image 38). Small amount of regional edema.

Small 7 mm enhancing lesion within the right temporal fusiform gyrus
(series 19, image 16). Minimal edema.

Small 7 mm enhancing lesion in the left superior cerebellum (series
18, image 61).

5 mm enhancing lesion right inferior occipital lobe (image 65).

Large partially enhancing partially cystic or necrotic 30 mm mass
left inferior parietal lobe near the left parieto-occipital sulcus
(image 91). Some associated hemosiderin. Regional vasogenic edema.
Minimal regional mass effect.

Abnormal DWI associated with the above on the basis of
hypercellularity. No dural thickening or enhancement identified. No
restricted diffusion suggestive of acute infarction. Outside of the
tumor related edema there is minimal to mild for age nonspecific
white matter T2 and FLAIR hyperintensity. No midline shift,
ventriculomegaly, extra-axial collection or acute intracranial
hemorrhage. Cervicomedullary junction and pituitary are within
normal limits. No cortical encephalomalacia identified.

Vascular: Major intracranial vascular flow voids are preserved. The
left vertebral artery appears dominant. There is mild generalized
intracranial artery tortuosity. The major dural venous sinuses are
enhancing and appear to be patent.

Skull and upper cervical spine: Negative visible cervical spine and
spinal cord. Visualized bone marrow signal is within normal limits.

Sinuses/Orbits: Negative orbits. Trace paranasal sinus mucosal
thickening.

Other: Trace bilateral mastoid effusions. Negative visible
nasopharynx. Visible internal auditory structures appear normal.

Partially visible abnormal left neck level 2 lymph nodes (series 7,
image 20 and series 9, image 18).
IMPRESSION: 1. Five enhancing brain metastases, ranging from 5 mm to the largest
which is a 3 cm left parietal mass with trace internal hemorrhage.
Vasogenic edema maximal in the left parietal lobe but no significant
intracranial mass effect.

2. Partially visible abnormal left level 2 lymph nodes in the neck.

## 2021-01-20 MED ORDER — GADOBUTROL 1 MMOL/ML IV SOLN
7.5000 mL | Freq: Once | INTRAVENOUS | Status: AC | PRN
  Administered 2021-01-20: 7.5 mL via INTRAVENOUS

## 2021-01-23 ENCOUNTER — Encounter
Admission: RE | Admit: 2021-01-23 | Discharge: 2021-01-23 | Disposition: A | Discharge status: Home / Self Care | Payer: Medicare HMO | Source: Ambulatory Visit | Attending: Oncology | Admitting: Oncology

## 2021-01-23 ENCOUNTER — Other Ambulatory Visit: Payer: Self-pay

## 2021-01-23 DIAGNOSIS — I700 Atherosclerosis of aorta: Secondary | ICD-10-CM | POA: Insufficient documentation

## 2021-01-23 DIAGNOSIS — C7A1 Malignant poorly differentiated neuroendocrine tumors: Secondary | ICD-10-CM | POA: Diagnosis not present

## 2021-01-23 DIAGNOSIS — J439 Emphysema, unspecified: Secondary | ICD-10-CM | POA: Insufficient documentation

## 2021-01-23 LAB — GLUCOSE, CAPILLARY
Glucose-Capillary: 228 mg/dL — ABNORMAL HIGH (ref 70–99)
Glucose-Capillary: 143 mg/dL — ABNORMAL HIGH (ref 70–99)

## 2021-01-23 IMAGING — CT NM PET TUM IMG INITIAL (PI) SKULL BASE T - THIGH
1 of 10 series · 1 of 25 positions shown · non-contrast
Comparison: Chest CT [DATE]

CLINICAL DATA: Initial treatment strategy for high-grade
neuroendocrine carcinoma identified in left cervical
lymphadenopathy.

EXAM:
NUCLEAR MEDICINE PET SKULL BASE TO THIGH
TECHNIQUE: 8.5 mCi F-18 FDG was injected intravenously. Full-ring PET imaging
was performed from the skull base to thigh after the radiotracer. CT
data was obtained and used for attenuation correction and anatomic
localization.
Fasting blood glucose: 143 mg/dl

[Series 3: ct wb 5.0 b30f · axial · 5.0mm · 0.98mm/px · 1 of 329 slices shown]
[im 329/329  brain]
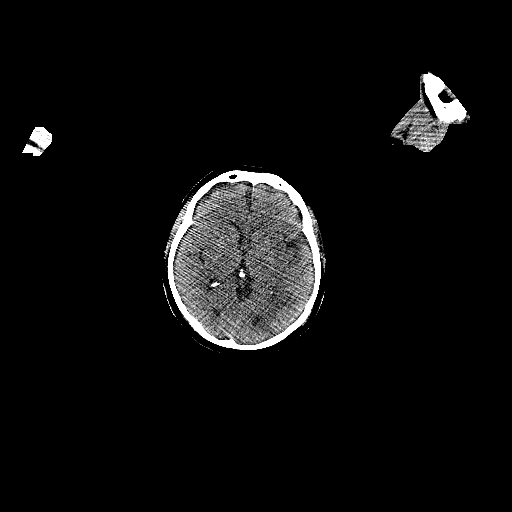

[1 of 25 positions shown; findings below may reference images not displayed]

FINDINGS: Mediastinal blood pool activity: SUV max

Liver activity: SUV max NA

NECK: Hypermetabolic lymphadenopathy is identified in the neck
bilaterally. Left-sided level [DATE] lymphadenopathy demonstrates SUV
max = 6.4. Right-sided level 3 node measuring 15 mm short axis on
53/3 demonstrates SUV max = 9.2.

Incidental CT findings: none

CHEST: Hypermetabolic mediastinal lymphadenopathy noted. 14 mm right
paratracheal node on 73/3 demonstrates SUV max = 5.5.

16 mm short axis subcarinal node on 95/3 demonstrates SUV max = 5.0.

Bulky right hilar lymphadenopathy demonstrates SUV max = 8.7.

4.5 x 3.2 cm posterior right lower lobe pulmonary mass (107/3) is
hypermetabolic with SUV max = 10.1. tiny adjacent satellite nodules
evident and regional septal thickening with ground-glass airspace
disease raises the question of lymphangitic tumor spread.

Incidental CT findings: Centrilobular and paraseptal emphysema
evident. No substantial pleural effusion.

ABDOMEN/PELVIS: No abnormal hypermetabolic activity within the
liver, pancreas, adrenal glands, or spleen.11 mm short axis
retrocaval lymph node in the upper abdomen ([DATE]) shows SUV max =
4.4.

Incidental CT findings: There is abdominal aortic atherosclerosis
without aneurysm. Left colonic diverticulosis without
diverticulitis.

SKELETON: No focal hypermetabolic activity to suggest skeletal
metastasis.

Incidental CT findings: none
IMPRESSION: 1. 4.5 cm posterior right lower lobe hypermetabolic pulmonary mass,
compatible with primary neoplasm.
2. Hypermetabolic bilateral cervical, left thoracic inlet,
mediastinal, and right hilar metastatic lymphadenopathy.
3. Borderline to mildly enlarged retrocaval lymph node in the upper
abdomen shows low level hypermetabolism. Metastatic disease a
concern.
4. No evidence for hypermetabolic metastases in the liver.
5.  Emphysema. ([C4]-[C4])
6.  Aortic Atherosclerois ([C4]-170.0)

## 2021-01-23 MED ORDER — FLUDEOXYGLUCOSE F - 18 (FDG) INJECTION
8.8000 | Freq: Once | INTRAVENOUS | Status: AC | PRN
  Administered 2021-01-23: 8.52 via INTRAVENOUS

## 2021-01-24 ENCOUNTER — Other Ambulatory Visit: Payer: Self-pay | Admitting: Oncology

## 2021-01-24 DIAGNOSIS — Z7189 Other specified counseling: Secondary | ICD-10-CM

## 2021-01-24 DIAGNOSIS — C7801 Secondary malignant neoplasm of right lung: Secondary | ICD-10-CM

## 2021-01-24 DIAGNOSIS — C7A1 Malignant poorly differentiated neuroendocrine tumors: Secondary | ICD-10-CM

## 2021-01-24 MED ORDER — PROCHLORPERAZINE MALEATE 10 MG PO TABS: 10.0000 mg | ORAL_TABLET | Freq: Four times a day (QID) | ORAL | 1 refills | Status: DC | PRN

## 2021-01-24 MED ORDER — ONDANSETRON HCL 8 MG PO TABS: 8.0000 mg | ORAL_TABLET | Freq: Two times a day (BID) | ORAL | 1 refills | Status: DC | PRN

## 2021-01-24 MED ORDER — DEXAMETHASONE 4 MG PO TABS: 8.0000 mg | ORAL_TABLET | Freq: Two times a day (BID) | ORAL | 0 refills | Status: DC

## 2021-01-24 NOTE — Progress Notes (Signed)
START OFF PATHWAY REGIMEN - Other ° ° °[id number]:Atezolizumab D1 + Carboplatin D1 + Etoposide  IV D1-3 q21 Days x 4 Cycles Followed by Atezolizumab D1 q21 Days: °  Cycles 1 through 4, every 21 days: °    Atezolizumab  °    Carboplatin  °    Etoposide  °  Cycles 5 and beyond, every 21 days: °    Atezolizumab  ° °**Always confirm dose/schedule in your pharmacy ordering system** ° °Patient Characteristics: °Intent of Therapy: °Non-Curative / Palliative Intent, Discussed with Patient °

## 2021-01-25 ENCOUNTER — Encounter: Payer: Self-pay | Admitting: *Deleted

## 2021-01-25 DIAGNOSIS — C7A8 Other malignant neuroendocrine tumors: Secondary | ICD-10-CM

## 2021-01-25 MED ORDER — LIDOCAINE-PRILOCAINE 2.5-2.5 % EX CREA: 1.0000 "application " | TOPICAL_CREAM | CUTANEOUS | 0 refills | Status: AC | PRN

## 2021-01-25 NOTE — Patient Instructions (Signed)
Atezolizumab injection What is this medicine? ATEZOLIZUMAB (a te zoe LIZ ue mab) is a monoclonal antibody. It is used to treat bladder cancer (urothelial cancer), liver cancer, lung cancer, and melanoma. This medicine may be used for other purposes; ask your health care provider or pharmacist if you have questions. COMMON BRAND NAME(S): Tecentriq What should I tell my health care provider before I take this medicine? They need to know if you have any of these conditions:  autoimmune diseases like Crohn's disease, ulcerative colitis, or lupus  have had or planning to have an allogeneic stem cell transplant (uses someone else's stem cells)  history of organ transplant  history of radiation to the chest  nervous system problems like myasthenia gravis or Guillain-Barre syndrome  an unusual or allergic reaction to atezolizumab, other medicines, foods, dyes, or preservatives  pregnant or trying to get pregnant  breast-feeding How should I use this medicine? This medicine is for infusion into a vein. It is given by a health care professional in a hospital or clinic setting. A special MedGuide will be given to you before each treatment. Be sure to read this information carefully each time. Talk to your pediatrician regarding the use of this medicine in children. Special care may be needed. Overdosage: If you think you have taken too much of this medicine contact a poison control center or emergency room at once. NOTE: This medicine is only for you. Do not share this medicine with others. What if I miss a dose? It is important not to miss your dose. Call your doctor or health care professional if you are unable to keep an appointment. What may interact with this medicine? Interactions have not been studied. This list may not describe all possible interactions. Give your health care provider a list of all the medicines, herbs, non-prescription drugs, or dietary supplements you use. Also tell  them if you smoke, drink alcohol, or use illegal drugs. Some items may interact with your medicine. What should I watch for while using this medicine? Your condition will be monitored carefully while you are receiving this medicine. You may need blood work done while you are taking this medicine. Do not become pregnant while taking this medicine or for at least 5 months after stopping it. Women should inform their doctor if they wish to become pregnant or think they might be pregnant. There is a potential for serious side effects to an unborn child. Talk to your health care professional or pharmacist for more information. Do not breast-feed an infant while taking this medicine or for at least 5 months after the last dose. What side effects may I notice from receiving this medicine? Side effects that you should report to your doctor or health care professional as soon as possible:  allergic reactions like skin rash, itching or hives, swelling of the face, lips, or tongue  black, tarry stools  bloody or watery diarrhea  breathing problems  changes in vision  chest pain or chest tightness  chills  facial flushing  fever  headache  signs and symptoms of high blood sugar such as dizziness; dry mouth; dry skin; fruity breath; nausea; stomach pain; increased hunger or thirst; increased urination  signs and symptoms of liver injury like dark yellow or brown urine; general ill feeling or flu-like symptoms; light-colored stools; loss of appetite; nausea; right upper belly pain; unusually weak or tired; yellowing of the eyes or skin  stomach pain  trouble passing urine or change in the amount of   urine Side effects that usually do not require medical attention (report to your doctor or health care professional if they continue or are bothersome):  bone pain  cough  diarrhea  joint pain  muscle pain  muscle weakness  swelling of arms or legs  tiredness  weight loss This list  may not describe all possible side effects. Call your doctor for medical advice about side effects. You may report side effects to FDA at 1-800-FDA-1088. Where should I keep my medicine? This drug is given in a hospital or clinic and will not be stored at home. NOTE: This sheet is a summary. It may not cover all possible information. If you have questions about this medicine, talk to your doctor, pharmacist, or health care provider.  2021 Elsevier/Gold Standard (2020-07-21 13:59:34) Etoposide, VP-16 injection What is this medicine? ETOPOSIDE, VP-16 (e toe POE side) is a chemotherapy drug. It is used to treat testicular cancer, lung cancer, and other cancers. This medicine may be used for other purposes; ask your health care provider or pharmacist if you have questions. COMMON BRAND NAME(S): Etopophos, Toposar, VePesid What should I tell my health care provider before I take this medicine? They need to know if you have any of these conditions:  infection  kidney disease  liver disease  low blood counts, like low white cell, platelet, or red cell counts  an unusual or allergic reaction to etoposide, other medicines, foods, dyes, or preservatives  pregnant or trying to get pregnant  breast-feeding How should I use this medicine? This medicine is for infusion into a vein. It is administered in a hospital or clinic by a specially trained health care professional. Talk to your pediatrician regarding the use of this medicine in children. Special care may be needed. Overdosage: If you think you have taken too much of this medicine contact a poison control center or emergency room at once. NOTE: This medicine is only for you. Do not share this medicine with others. What if I miss a dose? It is important not to miss your dose. Call your doctor or health care professional if you are unable to keep an appointment. What may interact with this medicine? This medicine may interact with the  following medications:  warfarin This list may not describe all possible interactions. Give your health care provider a list of all the medicines, herbs, non-prescription drugs, or dietary supplements you use. Also tell them if you smoke, drink alcohol, or use illegal drugs. Some items may interact with your medicine. What should I watch for while using this medicine? Visit your doctor for checks on your progress. This drug may make you feel generally unwell. This is not uncommon, as chemotherapy can affect healthy cells as well as cancer cells. Report any side effects. Continue your course of treatment even though you feel ill unless your doctor tells you to stop. In some cases, you may be given additional medicines to help with side effects. Follow all directions for their use. Call your doctor or health care professional for advice if you get a fever, chills or sore throat, or other symptoms of a cold or flu. Do not treat yourself. This drug decreases your body's ability to fight infections. Try to avoid being around people who are sick. This medicine may increase your risk to bruise or bleed. Call your doctor or health care professional if you notice any unusual bleeding. Talk to your doctor about your risk of cancer. You may be more at risk for   certain types of cancers if you take this medicine. Do not become pregnant while taking this medicine or for at least 6 months after stopping it. Women should inform their doctor if they wish to become pregnant or think they might be pregnant. Women of child-bearing potential will need to have a negative pregnancy test before starting this medicine. There is a potential for serious side effects to an unborn child. Talk to your health care professional or pharmacist for more information. Do not breast-feed an infant while taking this medicine. Men must use a latex condom during sexual contact with a woman while taking this medicine and for at least 4 months  after stopping it. A latex condom is needed even if you have had a vasectomy. Contact your doctor right away if your partner becomes pregnant. Do not donate sperm while taking this medicine and for at least 4 months after you stop taking this medicine. Men should inform their doctors if they wish to father a child. This medicine may lower sperm counts. What side effects may I notice from receiving this medicine? Side effects that you should report to your doctor or health care professional as soon as possible:  allergic reactions like skin rash, itching or hives, swelling of the face, lips, or tongue  low blood counts - this medicine may decrease the number of white blood cells, red blood cells, and platelets. You may be at increased risk for infections and bleeding  nausea, vomiting  redness, blistering, peeling or loosening of the skin, including inside the mouth  signs and symptoms of infection like fever; chills; cough; sore throat; pain or trouble passing urine  signs and symptoms of low red blood cells or anemia such as unusually weak or tired; feeling faint or lightheaded; falls; breathing problems  unusual bruising or bleeding Side effects that usually do not require medical attention (report to your doctor or health care professional if they continue or are bothersome):  changes in taste  diarrhea  hair loss  loss of appetite  mouth sores This list may not describe all possible side effects. Call your doctor for medical advice about side effects. You may report side effects to FDA at 1-800-FDA-1088. Where should I keep my medicine? This drug is given in a hospital or clinic and will not be stored at home. NOTE: This sheet is a summary. It may not cover all possible information. If you have questions about this medicine, talk to your doctor, pharmacist, or health care provider.  2021 Elsevier/Gold Standard (2018-12-17 16:57:15) Carboplatin injection What is this  medicine? CARBOPLATIN (KAR boe pla tin) is a chemotherapy drug. It targets fast dividing cells, like cancer cells, and causes these cells to die. This medicine is used to treat ovarian cancer and many other cancers. This medicine may be used for other purposes; ask your health care provider or pharmacist if you have questions. COMMON BRAND NAME(S): Paraplatin What should I tell my health care provider before I take this medicine? They need to know if you have any of these conditions:  blood disorders  hearing problems  kidney disease  recent or ongoing radiation therapy  an unusual or allergic reaction to carboplatin, cisplatin, other chemotherapy, other medicines, foods, dyes, or preservatives  pregnant or trying to get pregnant  breast-feeding How should I use this medicine? This drug is usually given as an infusion into a vein. It is administered in a hospital or clinic by a specially trained health care professional. Talk to your   pediatrician regarding the use of this medicine in children. Special care may be needed. Overdosage: If you think you have taken too much of this medicine contact a poison control center or emergency room at once. NOTE: This medicine is only for you. Do not share this medicine with others. What if I miss a dose? It is important not to miss a dose. Call your doctor or health care professional if you are unable to keep an appointment. What may interact with this medicine?  medicines for seizures  medicines to increase blood counts like filgrastim, pegfilgrastim, sargramostim  some antibiotics like amikacin, gentamicin, neomycin, streptomycin, tobramycin  vaccines Talk to your doctor or health care professional before taking any of these medicines:  acetaminophen  aspirin  ibuprofen  ketoprofen  naproxen This list may not describe all possible interactions. Give your health care provider a list of all the medicines, herbs, non-prescription  drugs, or dietary supplements you use. Also tell them if you smoke, drink alcohol, or use illegal drugs. Some items may interact with your medicine. What should I watch for while using this medicine? Your condition will be monitored carefully while you are receiving this medicine. You will need important blood work done while you are taking this medicine. This drug may make you feel generally unwell. This is not uncommon, as chemotherapy can affect healthy cells as well as cancer cells. Report any side effects. Continue your course of treatment even though you feel ill unless your doctor tells you to stop. In some cases, you may be given additional medicines to help with side effects. Follow all directions for their use. Call your doctor or health care professional for advice if you get a fever, chills or sore throat, or other symptoms of a cold or flu. Do not treat yourself. This drug decreases your body's ability to fight infections. Try to avoid being around people who are sick. This medicine may increase your risk to bruise or bleed. Call your doctor or health care professional if you notice any unusual bleeding. Be careful brushing and flossing your teeth or using a toothpick because you may get an infection or bleed more easily. If you have any dental work done, tell your dentist you are receiving this medicine. Avoid taking products that contain aspirin, acetaminophen, ibuprofen, naproxen, or ketoprofen unless instructed by your doctor. These medicines may hide a fever. Do not become pregnant while taking this medicine. Women should inform their doctor if they wish to become pregnant or think they might be pregnant. There is a potential for serious side effects to an unborn child. Talk to your health care professional or pharmacist for more information. Do not breast-feed an infant while taking this medicine. What side effects may I notice from receiving this medicine? Side effects that you should  report to your doctor or health care professional as soon as possible:  allergic reactions like skin rash, itching or hives, swelling of the face, lips, or tongue  signs of infection - fever or chills, cough, sore throat, pain or difficulty passing urine  signs of decreased platelets or bleeding - bruising, pinpoint red spots on the skin, black, tarry stools, nosebleeds  signs of decreased red blood cells - unusually weak or tired, fainting spells, lightheadedness  breathing problems  changes in hearing  changes in vision  chest pain  high blood pressure  low blood counts - This drug may decrease the number of white blood cells, red blood cells and platelets. You may be   at increased risk for infections and bleeding.  nausea and vomiting  pain, swelling, redness or irritation at the injection site  pain, tingling, numbness in the hands or feet  problems with balance, talking, walking  trouble passing urine or change in the amount of urine Side effects that usually do not require medical attention (report to your doctor or health care professional if they continue or are bothersome):  hair loss  loss of appetite  metallic taste in the mouth or changes in taste This list may not describe all possible side effects. Call your doctor for medical advice about side effects. You may report side effects to FDA at 1-800-FDA-1088. Where should I keep my medicine? This drug is given in a hospital or clinic and will not be stored at home. NOTE: This sheet is a summary. It may not cover all possible information. If you have questions about this medicine, talk to your doctor, pharmacist, or health care provider.  2021 Elsevier/Gold Standard (2008-01-27 14:38:05)  

## 2021-01-25 NOTE — Progress Notes (Signed)
  Oncology Nurse Navigator Documentation  Navigator Location: [facility] (01/25/21 1000) Referral Date to RadOnc/MedOnc: 01/05/21 (01/25/21 1000) )Navigator Encounter Type: Telephone (01/25/21 1000) Telephone: Diagnostic Results;Outgoing Call;Appt Confirmation/Clarification (01/25/21 1000) Abnormal Finding Date: 12/30/20 (01/25/21 1000) Confirmed Diagnosis Date: 01/05/21 (01/25/21 1000)                 Treatment Phase: Pre-Tx/Tx Discussion (01/25/21 1000) Barriers/Navigation Needs: Coordination of Care (01/25/21 1000)   Interventions: Coordination of Care (01/25/21 1000)            Acuity: Level 2-Minimal Needs (1-2 Barriers Identified) (01/25/21 1000)    per Dr. 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 Pt will need chemotherapy and referral to radonc. Spoke with pt and informed of results and MD recommendations. Pt instructed to start taking decadron today. Aware of his visit for chemo edu on 3/24 and pt informed that will be given appts for chemotherapy at that visit. Message left with pt's wife as well to call back to further discuss results and recommendations. Contact info given and instructed to call with any further questions or needs. Pt verbalized understanding.      Time Spent with Patient: 45 (01/25/21 1000)

## 2021-01-25 NOTE — Addendum Note (Signed)
Addended by: Telford Nab on: 01/25/2021 02:34 PM   Modules accepted: Orders

## 2021-01-26 ENCOUNTER — Inpatient Hospital Stay: Payer: Medicare HMO

## 2021-01-26 ENCOUNTER — Telehealth (INDEPENDENT_AMBULATORY_CARE_PROVIDER_SITE_OTHER): Payer: Self-pay

## 2021-01-26 ENCOUNTER — Ambulatory Visit
Admission: RE | Admit: 2021-01-26 | Discharge: 2021-01-26 | Disposition: A | Discharge status: Home / Self Care | Payer: Medicare HMO | Source: Ambulatory Visit | Attending: Radiation Oncology | Admitting: Radiation Oncology

## 2021-01-26 ENCOUNTER — Encounter: Payer: Self-pay | Admitting: Radiation Oncology

## 2021-01-26 ENCOUNTER — Encounter: Payer: Self-pay | Admitting: *Deleted

## 2021-01-26 VITALS — BP 165/76 | HR 56 | Temp 94.9°F | Resp 16 | Wt 172.6 lb

## 2021-01-26 DIAGNOSIS — Z7982 Long term (current) use of aspirin: Secondary | ICD-10-CM | POA: Insufficient documentation

## 2021-01-26 DIAGNOSIS — Z79899 Other long term (current) drug therapy: Secondary | ICD-10-CM | POA: Insufficient documentation

## 2021-01-26 DIAGNOSIS — K219 Gastro-esophageal reflux disease without esophagitis: Secondary | ICD-10-CM | POA: Insufficient documentation

## 2021-01-26 DIAGNOSIS — Z87891 Personal history of nicotine dependence: Secondary | ICD-10-CM | POA: Diagnosis not present

## 2021-01-26 DIAGNOSIS — C3431 Malignant neoplasm of lower lobe, right bronchus or lung: Secondary | ICD-10-CM | POA: Diagnosis not present

## 2021-01-26 DIAGNOSIS — C7931 Secondary malignant neoplasm of brain: Secondary | ICD-10-CM

## 2021-01-26 DIAGNOSIS — I10 Essential (primary) hypertension: Secondary | ICD-10-CM | POA: Insufficient documentation

## 2021-01-26 DIAGNOSIS — I252 Old myocardial infarction: Secondary | ICD-10-CM | POA: Diagnosis not present

## 2021-01-26 DIAGNOSIS — I2510 Atherosclerotic heart disease of native coronary artery without angina pectoris: Secondary | ICD-10-CM | POA: Insufficient documentation

## 2021-01-26 DIAGNOSIS — M129 Arthropathy, unspecified: Secondary | ICD-10-CM | POA: Diagnosis not present

## 2021-01-26 NOTE — Telephone Encounter (Signed)
Gale Journey,   I placed a referral for this pt to get port placement. He is going to start chemo on 3/30 but does not need the port to start treatment. He will be here tomorrow for an appt to discuss chemo and the need for port. If you wanted me to give him any appt details for his port placement I would be happy to.   Thank you!  -[facility] [place],   I can place him on either 02/02/21 with a 9:30 arrival and covid on 03/29 or 02/06/21 with a 6:45 am arrival and covid on 03/31. Let me know which would be a better fit for you all.   Thank you,  Johna Sheriff, Angie Fava, RN  Devona Konig, [place] do 4/4 with covid test on 3/31.   Thank you!  [facility]  Patient scheduled with Dr. Lucky Cowboy for a port placement on 02/06/21 with a 6:45 am arrival to the [facility]. Covid testing on 02/02/21 between 8-2 pm at the [facility]. Pre-procedure instructions will be mailed to the patient.

## 2021-01-26 NOTE — Progress Notes (Signed)
  Oncology Nurse Navigator Documentation  Navigator Location: [facility] (01/26/21 1300)   )Navigator Encounter Type: Initial RadOnc (01/26/21 1300)                         Barriers/Navigation Needs: Coordination of Care (01/26/21 1300)   Interventions: Coordination of Care (01/26/21 1300)   Coordination of Care: Appts;Chemo (01/26/21 1300)           met with patient during chemo education and initial consult with Dr. Baruch Gouty. All questions answered during visit. Reviewed upcoming appts with patient including chemotherapy treatment days and scheduled port placement. Pt informed that will need covid testing [facility] 3[facility] Reviewed new prescriptions sent into pharmacy to have during chemotherapy treatments. Pt has been instructed to start taking dexamethasone as prescribed today. Pt verbalized understanding. Nothing further needed at this time.        Time Spent with Patient: 60 (01/26/21 1300)
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
## 2021-01-27 ENCOUNTER — Ambulatory Visit
Admission: RE | Admit: 2021-01-27 | Discharge: 2021-01-27 | Disposition: A | Discharge status: Home / Self Care | Payer: Medicare HMO | Source: Ambulatory Visit | Attending: Radiation Oncology | Admitting: Radiation Oncology

## 2021-01-27 ENCOUNTER — Other Ambulatory Visit: Payer: Self-pay | Admitting: Oncology

## 2021-01-27 ENCOUNTER — Telehealth: Payer: Self-pay

## 2021-01-27 DIAGNOSIS — C7931 Secondary malignant neoplasm of brain: Secondary | ICD-10-CM | POA: Diagnosis not present

## 2021-01-27 DIAGNOSIS — Z510 Encounter for antineoplastic radiation therapy: Secondary | ICD-10-CM | POA: Diagnosis not present

## 2021-01-27 DIAGNOSIS — C7A8 Other malignant neuroendocrine tumors: Secondary | ICD-10-CM | POA: Diagnosis not present

## 2021-01-27 DIAGNOSIS — Z87891 Personal history of nicotine dependence: Secondary | ICD-10-CM | POA: Diagnosis not present

## 2021-01-27 DIAGNOSIS — C3431 Malignant neoplasm of lower lobe, right bronchus or lung: Secondary | ICD-10-CM | POA: Diagnosis not present

## 2021-01-27 NOTE — Telephone Encounter (Signed)
Pt scheduled for lab/MD/ carbo etop tecentriq on 3/30, etop day 2 (3/31), etop day 3 (4/1).  Will need Fulphila scheduled on Monday 4/4, per MD.

## 2021-01-30 DIAGNOSIS — C7931 Secondary malignant neoplasm of brain: Secondary | ICD-10-CM | POA: Diagnosis not present

## 2021-01-30 DIAGNOSIS — Z87891 Personal history of nicotine dependence: Secondary | ICD-10-CM | POA: Diagnosis not present

## 2021-01-30 DIAGNOSIS — Z510 Encounter for antineoplastic radiation therapy: Secondary | ICD-10-CM | POA: Diagnosis not present

## 2021-01-30 DIAGNOSIS — C3431 Malignant neoplasm of lower lobe, right bronchus or lung: Secondary | ICD-10-CM | POA: Diagnosis not present

## 2021-01-30 DIAGNOSIS — C7A8 Other malignant neuroendocrine tumors: Secondary | ICD-10-CM | POA: Diagnosis not present

## 2021-02-01 ENCOUNTER — Inpatient Hospital Stay: Payer: Medicare HMO

## 2021-02-01 ENCOUNTER — Encounter: Payer: Self-pay | Admitting: *Deleted

## 2021-02-01 ENCOUNTER — Encounter: Payer: Self-pay | Admitting: Oncology

## 2021-02-01 ENCOUNTER — Ambulatory Visit: Admission: RE | Admit: 2021-02-01 | Payer: Medicare HMO | Source: Ambulatory Visit

## 2021-02-01 ENCOUNTER — Inpatient Hospital Stay (HOSPITAL_BASED_OUTPATIENT_CLINIC_OR_DEPARTMENT_OTHER): Payer: Medicare HMO | Admitting: Oncology

## 2021-02-01 VITALS — Ht 67.0 in

## 2021-02-01 VITALS — BP 126/72 | HR 54 | Temp 97.3°F | Resp 18 | Wt 173.6 lb

## 2021-02-01 DIAGNOSIS — C7A8 Other malignant neuroendocrine tumors: Secondary | ICD-10-CM | POA: Diagnosis not present

## 2021-02-01 DIAGNOSIS — C3431 Malignant neoplasm of lower lobe, right bronchus or lung: Secondary | ICD-10-CM | POA: Diagnosis not present

## 2021-02-01 DIAGNOSIS — Z801 Family history of malignant neoplasm of trachea, bronchus and lung: Secondary | ICD-10-CM | POA: Diagnosis not present

## 2021-02-01 DIAGNOSIS — C7A1 Malignant poorly differentiated neuroendocrine tumors: Secondary | ICD-10-CM

## 2021-02-01 DIAGNOSIS — C7931 Secondary malignant neoplasm of brain: Secondary | ICD-10-CM

## 2021-02-01 DIAGNOSIS — R0602 Shortness of breath: Secondary | ICD-10-CM | POA: Diagnosis not present

## 2021-02-01 DIAGNOSIS — C7802 Secondary malignant neoplasm of left lung: Secondary | ICD-10-CM

## 2021-02-01 DIAGNOSIS — Z7189 Other specified counseling: Secondary | ICD-10-CM

## 2021-02-01 DIAGNOSIS — Z87891 Personal history of nicotine dependence: Secondary | ICD-10-CM

## 2021-02-01 DIAGNOSIS — Z9049 Acquired absence of other specified parts of digestive tract: Secondary | ICD-10-CM | POA: Diagnosis not present

## 2021-02-01 DIAGNOSIS — Z5111 Encounter for antineoplastic chemotherapy: Secondary | ICD-10-CM

## 2021-02-01 DIAGNOSIS — Z809 Family history of malignant neoplasm, unspecified: Secondary | ICD-10-CM | POA: Diagnosis not present

## 2021-02-01 DIAGNOSIS — Z79899 Other long term (current) drug therapy: Secondary | ICD-10-CM | POA: Diagnosis not present

## 2021-02-01 DIAGNOSIS — Z510 Encounter for antineoplastic radiation therapy: Secondary | ICD-10-CM | POA: Diagnosis not present

## 2021-02-01 DIAGNOSIS — R590 Localized enlarged lymph nodes: Secondary | ICD-10-CM | POA: Diagnosis not present

## 2021-02-01 DIAGNOSIS — R634 Abnormal weight loss: Secondary | ICD-10-CM | POA: Diagnosis not present

## 2021-02-01 LAB — CBC WITH DIFFERENTIAL/PLATELET
Abs Immature Granulocytes: 0.01 10*3/uL (ref 0.00–0.07)
Basophils Absolute: 0 10*3/uL (ref 0.0–0.1)
Basophils Relative: 1 %
Eosinophils Absolute: 0.3 10*3/uL (ref 0.0–0.5)
Eosinophils Relative: 4 %
HCT: 43.8 % (ref 39.0–52.0)
Hemoglobin: 15.7 g/dL (ref 13.0–17.0)
Immature Granulocytes: 0 %
Lymphocytes Relative: 26 %
Lymphs Abs: 1.7 10*3/uL (ref 0.7–4.0)
MCH: 33 pg (ref 26.0–34.0)
MCHC: 35.8 g/dL (ref 30.0–36.0)
MCV: 92 fL (ref 80.0–100.0)
Monocytes Absolute: 0.6 10*3/uL (ref 0.1–1.0)
Monocytes Relative: 10 %
Neutro Abs: 3.9 10*3/uL (ref 1.7–7.7)
Neutrophils Relative %: 59 %
Platelets: 176 10*3/uL (ref 150–400)
RBC: 4.76 MIL/uL (ref 4.22–5.81)
RDW: 13.1 % (ref 11.5–15.5)
WBC: 6.6 10*3/uL (ref 4.0–10.5)
nRBC: 0 % (ref 0.0–0.2)

## 2021-02-01 LAB — COMPREHENSIVE METABOLIC PANEL
ALT: 22 U/L (ref 0–44)
AST: 29 U/L (ref 15–41)
Albumin: 4.1 g/dL (ref 3.5–5.0)
Alkaline Phosphatase: 48 U/L (ref 38–126)
Anion gap: 9 (ref 5–15)
BUN: 13 mg/dL (ref 8–23)
CO2: 25 mmol/L (ref 22–32)
Calcium: 9.1 mg/dL (ref 8.9–10.3)
Chloride: 105 mmol/L (ref 98–111)
Creatinine, Ser: 0.93 mg/dL (ref 0.61–1.24)
GFR, Estimated: >60 mL/min (ref >60)
Glucose, Bld: 171 mg/dL — ABNORMAL HIGH (ref 70–99)
Potassium: 4.1 mmol/L (ref 3.5–5.1)
Sodium: 139 mmol/L (ref 135–145)
Total Bilirubin: 0.9 mg/dL (ref 0.3–1.2)
Total Protein: 6.8 g/dL (ref 6.5–8.1)

## 2021-02-01 LAB — LACTATE DEHYDROGENASE: LDH: 169 U/L (ref 98–192)

## 2021-02-01 LAB — TSH: TSH: 1.984 u[IU]/mL (ref 0.350–4.500)

## 2021-02-01 MED ORDER — SODIUM CHLORIDE 0.9 % IV SOLN
150.0000 mg | Freq: Once | INTRAVENOUS | Status: AC
  Administered 2021-02-01: 150 mg via INTRAVENOUS
  Filled 2021-02-01: qty 150

## 2021-02-01 MED ORDER — PALONOSETRON HCL INJECTION 0.25 MG/5ML
0.2500 mg | Freq: Once | INTRAVENOUS | Status: AC
  Administered 2021-02-01: 0.25 mg via INTRAVENOUS
  Filled 2021-02-01: qty 5

## 2021-02-01 MED ORDER — SODIUM CHLORIDE 0.9 % IV SOLN
100.0000 mg/m2 | Freq: Once | INTRAVENOUS | Status: AC
  Administered 2021-02-01: 180 mg via INTRAVENOUS
  Filled 2021-02-01: qty 9

## 2021-02-01 MED ORDER — SODIUM CHLORIDE 0.9 % IV SOLN
483.0000 mg | Freq: Once | INTRAVENOUS | Status: AC
  Administered 2021-02-01: 480 mg via INTRAVENOUS
  Filled 2021-02-01: qty 48

## 2021-02-01 MED ORDER — SODIUM CHLORIDE 0.9 % IV SOLN
10.0000 mg | Freq: Once | INTRAVENOUS | Status: AC
  Administered 2021-02-01: 10 mg via INTRAVENOUS
  Filled 2021-02-01: qty 10

## 2021-02-01 MED ORDER — SODIUM CHLORIDE 0.9 % IV SOLN
Freq: Once | INTRAVENOUS | Status: AC
Start: 2021-02-01 — End: 2021-02-01
  Filled 2021-02-01: qty 250
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
## 2021-02-01 NOTE — Progress Notes (Signed)
  Oncology Nurse Navigator Documentation  Navigator Location: [facility] (02/01/21 1200)   )Navigator Encounter Type: Follow-up Appt;Treatment (02/01/21 1200)                   Treatment Initiated Date: 02/01/21 (02/01/21 1200) Patient Visit Type: MedOnc (02/01/21 1200) Treatment Phase: First Chemo Tx (02/01/21 1200) Barriers/Navigation Needs: Coordination of Care (02/01/21 1200)   Interventions: Coordination of Care (02/01/21 1200)   Coordination of Care: Appts (02/01/21 1200)        Acuity: Level 2-Minimal Needs (1-2 Barriers Identified) (02/01/21 1200)    met with patient and his wife during follow up visit with Dr. Tasia Catchings. All questions answered during visit. Reviewed upcoming appts in detail with patient while in infusion. Instructed pt to call with any further questions or needs. Pt verbalized understanding. Nothing further needed at this time.     Time Spent with Patient: 60 (02/01/21 1200)

## 2021-02-01 NOTE — Progress Notes (Signed)
Patient here for follow up
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
## 2021-02-02 ENCOUNTER — Telehealth (INDEPENDENT_AMBULATORY_CARE_PROVIDER_SITE_OTHER): Payer: Self-pay

## 2021-02-02 ENCOUNTER — Other Ambulatory Visit: Payer: Self-pay

## 2021-02-02 ENCOUNTER — Other Ambulatory Visit: Payer: Medicare HMO

## 2021-02-02 ENCOUNTER — Other Ambulatory Visit
Admission: RE | Admit: 2021-02-02 | Discharge: 2021-02-02 | Disposition: A | Discharge status: Home / Self Care | Payer: Medicare HMO | Source: Ambulatory Visit | Attending: Vascular Surgery | Admitting: Vascular Surgery

## 2021-02-02 ENCOUNTER — Inpatient Hospital Stay: Payer: Medicare HMO

## 2021-02-02 ENCOUNTER — Telehealth: Payer: Self-pay

## 2021-02-02 ENCOUNTER — Ambulatory Visit
Admission: RE | Admit: 2021-02-02 | Discharge: 2021-02-02 | Disposition: A | Discharge status: Home / Self Care | Payer: Medicare HMO | Source: Ambulatory Visit | Attending: Radiation Oncology | Admitting: Radiation Oncology

## 2021-02-02 ENCOUNTER — Other Ambulatory Visit: Payer: Self-pay | Admitting: Oncology

## 2021-02-02 VITALS — BP 142/69 | HR 60 | Temp 97.0°F | Resp 16

## 2021-02-02 DIAGNOSIS — C7802 Secondary malignant neoplasm of left lung: Secondary | ICD-10-CM

## 2021-02-02 DIAGNOSIS — Z510 Encounter for antineoplastic radiation therapy: Secondary | ICD-10-CM | POA: Diagnosis not present

## 2021-02-02 DIAGNOSIS — R590 Localized enlarged lymph nodes: Secondary | ICD-10-CM | POA: Diagnosis not present

## 2021-02-02 DIAGNOSIS — Z20822 Contact with and (suspected) exposure to covid-19: Secondary | ICD-10-CM | POA: Insufficient documentation

## 2021-02-02 DIAGNOSIS — Z801 Family history of malignant neoplasm of trachea, bronchus and lung: Secondary | ICD-10-CM | POA: Diagnosis not present

## 2021-02-02 DIAGNOSIS — Z79899 Other long term (current) drug therapy: Secondary | ICD-10-CM | POA: Diagnosis not present

## 2021-02-02 DIAGNOSIS — Z809 Family history of malignant neoplasm, unspecified: Secondary | ICD-10-CM | POA: Diagnosis not present

## 2021-02-02 DIAGNOSIS — R634 Abnormal weight loss: Secondary | ICD-10-CM | POA: Diagnosis not present

## 2021-02-02 DIAGNOSIS — C7931 Secondary malignant neoplasm of brain: Secondary | ICD-10-CM | POA: Diagnosis not present

## 2021-02-02 DIAGNOSIS — C7A8 Other malignant neuroendocrine tumors: Secondary | ICD-10-CM | POA: Diagnosis not present

## 2021-02-02 DIAGNOSIS — R0602 Shortness of breath: Secondary | ICD-10-CM | POA: Diagnosis not present

## 2021-02-02 DIAGNOSIS — Z01812 Encounter for preprocedural laboratory examination: Secondary | ICD-10-CM | POA: Insufficient documentation

## 2021-02-02 DIAGNOSIS — Z87891 Personal history of nicotine dependence: Secondary | ICD-10-CM | POA: Diagnosis not present

## 2021-02-02 DIAGNOSIS — Z9049 Acquired absence of other specified parts of digestive tract: Secondary | ICD-10-CM | POA: Diagnosis not present

## 2021-02-02 DIAGNOSIS — C7A1 Malignant poorly differentiated neuroendocrine tumors: Secondary | ICD-10-CM | POA: Diagnosis not present

## 2021-02-02 LAB — T4: T4, Total: 7.2 ug/dL (ref 4.5–12.0)

## 2021-02-02 LAB — SARS CORONAVIRUS 2 (TAT 6-24 HRS): SARS Coronavirus 2: NEGATIVE

## 2021-02-02 MED ORDER — HEPARIN SOD (PORK) LOCK FLUSH 100 UNIT/ML IV SOLN
500.0000 [IU] | Freq: Once | INTRAVENOUS | Status: DC | PRN
  Filled 2021-02-02: qty 5

## 2021-02-02 MED ORDER — SODIUM CHLORIDE 0.9 % IV SOLN
Freq: Once | INTRAVENOUS | Status: AC
  Filled 2021-02-02: qty 250

## 2021-02-02 MED ORDER — SODIUM CHLORIDE 0.9% FLUSH
10.0000 mL | INTRAVENOUS | Status: DC | PRN
  Filled 2021-02-02: qty 10

## 2021-02-02 MED ORDER — SODIUM CHLORIDE 0.9 % IV SOLN
100.0000 mg/m2 | Freq: Once | INTRAVENOUS | Status: AC
  Administered 2021-02-02: 180 mg via INTRAVENOUS
  Filled 2021-02-02: qty 9

## 2021-02-02 MED ORDER — SODIUM CHLORIDE 0.9 % IV SOLN
10.0000 mg | Freq: Once | INTRAVENOUS | Status: AC
  Administered 2021-02-02: 10 mg via INTRAVENOUS
  Filled 2021-02-02: qty 10

## 2021-02-02 NOTE — Telephone Encounter (Signed)
I have attempted to contact the patient to let him know his procedure arrival time has changed. Both lines were not accepting calls.

## 2021-02-02 NOTE — Telephone Encounter (Signed)
T/C to pt for follow up after receiving first chemo  but no answer and no voice mail came on.  Just kept ringing.   Unable to leave message.

## 2021-02-02 NOTE — Telephone Encounter (Signed)
I attempted to contact the patient again and a message was left on the home number for a return call.

## 2021-02-03 ENCOUNTER — Ambulatory Visit
Admission: RE | Admit: 2021-02-03 | Discharge: 2021-02-03 | Disposition: A | Discharge status: Home / Self Care | Payer: Medicare HMO | Source: Ambulatory Visit | Attending: Radiation Oncology | Admitting: Radiation Oncology

## 2021-02-03 ENCOUNTER — Inpatient Hospital Stay: Payer: Medicare HMO | Attending: Oncology

## 2021-02-03 VITALS — BP 124/68 | HR 61 | Temp 96.5°F | Resp 16

## 2021-02-03 DIAGNOSIS — Z7982 Long term (current) use of aspirin: Secondary | ICD-10-CM | POA: Diagnosis not present

## 2021-02-03 DIAGNOSIS — Z79899 Other long term (current) drug therapy: Secondary | ICD-10-CM | POA: Insufficient documentation

## 2021-02-03 DIAGNOSIS — Z801 Family history of malignant neoplasm of trachea, bronchus and lung: Secondary | ICD-10-CM | POA: Insufficient documentation

## 2021-02-03 DIAGNOSIS — R5383 Other fatigue: Secondary | ICD-10-CM | POA: Diagnosis not present

## 2021-02-03 DIAGNOSIS — J432 Centrilobular emphysema: Secondary | ICD-10-CM | POA: Insufficient documentation

## 2021-02-03 DIAGNOSIS — C7A8 Other malignant neuroendocrine tumors: Secondary | ICD-10-CM | POA: Insufficient documentation

## 2021-02-03 DIAGNOSIS — Z510 Encounter for antineoplastic radiation therapy: Secondary | ICD-10-CM | POA: Diagnosis not present

## 2021-02-03 DIAGNOSIS — I700 Atherosclerosis of aorta: Secondary | ICD-10-CM | POA: Insufficient documentation

## 2021-02-03 DIAGNOSIS — C7B01 Secondary carcinoid tumors of distant lymph nodes: Secondary | ICD-10-CM | POA: Diagnosis not present

## 2021-02-03 DIAGNOSIS — C7B09 Secondary carcinoid tumors of other sites: Secondary | ICD-10-CM | POA: Insufficient documentation

## 2021-02-03 DIAGNOSIS — Z8616 Personal history of COVID-19: Secondary | ICD-10-CM | POA: Diagnosis not present

## 2021-02-03 DIAGNOSIS — R531 Weakness: Secondary | ICD-10-CM | POA: Insufficient documentation

## 2021-02-03 DIAGNOSIS — R0602 Shortness of breath: Secondary | ICD-10-CM | POA: Diagnosis not present

## 2021-02-03 DIAGNOSIS — R918 Other nonspecific abnormal finding of lung field: Secondary | ICD-10-CM | POA: Diagnosis not present

## 2021-02-03 DIAGNOSIS — Z9049 Acquired absence of other specified parts of digestive tract: Secondary | ICD-10-CM | POA: Diagnosis not present

## 2021-02-03 DIAGNOSIS — Z809 Family history of malignant neoplasm, unspecified: Secondary | ICD-10-CM | POA: Insufficient documentation

## 2021-02-03 DIAGNOSIS — R739 Hyperglycemia, unspecified: Secondary | ICD-10-CM | POA: Diagnosis not present

## 2021-02-03 DIAGNOSIS — I10 Essential (primary) hypertension: Secondary | ICD-10-CM | POA: Diagnosis not present

## 2021-02-03 DIAGNOSIS — I2510 Atherosclerotic heart disease of native coronary artery without angina pectoris: Secondary | ICD-10-CM | POA: Diagnosis not present

## 2021-02-03 DIAGNOSIS — Z87891 Personal history of nicotine dependence: Secondary | ICD-10-CM | POA: Diagnosis not present

## 2021-02-03 DIAGNOSIS — C7931 Secondary malignant neoplasm of brain: Secondary | ICD-10-CM | POA: Insufficient documentation

## 2021-02-03 DIAGNOSIS — Z5111 Encounter for antineoplastic chemotherapy: Secondary | ICD-10-CM | POA: Insufficient documentation

## 2021-02-03 DIAGNOSIS — C3431 Malignant neoplasm of lower lobe, right bronchus or lung: Secondary | ICD-10-CM | POA: Diagnosis not present

## 2021-02-03 DIAGNOSIS — C7802 Secondary malignant neoplasm of left lung: Secondary | ICD-10-CM

## 2021-02-03 DIAGNOSIS — C7A1 Malignant poorly differentiated neuroendocrine tumors: Secondary | ICD-10-CM | POA: Insufficient documentation

## 2021-02-03 DIAGNOSIS — I252 Old myocardial infarction: Secondary | ICD-10-CM | POA: Diagnosis not present

## 2021-02-03 DIAGNOSIS — Z7952 Long term (current) use of systemic steroids: Secondary | ICD-10-CM | POA: Insufficient documentation

## 2021-02-03 MED ORDER — SODIUM CHLORIDE 0.9 % IV SOLN
100.0000 mg/m2 | Freq: Once | INTRAVENOUS | Status: AC
  Administered 2021-02-03: 180 mg via INTRAVENOUS
  Filled 2021-02-03: qty 9

## 2021-02-03 MED ORDER — SODIUM CHLORIDE 0.9 % IV SOLN
10.0000 mg | Freq: Once | INTRAVENOUS | Status: AC
  Administered 2021-02-03: 10 mg via INTRAVENOUS
  Filled 2021-02-03: qty 10

## 2021-02-03 MED ORDER — SODIUM CHLORIDE 0.9 % IV SOLN
Freq: Once | INTRAVENOUS | Status: AC
  Filled 2021-02-03: qty 250

## 2021-02-03 NOTE — Telephone Encounter (Signed)
Patient returned my call this morning and was given a different arrival time of 12:45 pm to the MM for his port placement on 02/06/21 with Dr. Lucky Cowboy. Patient understood.

## 2021-02-05 ENCOUNTER — Other Ambulatory Visit (INDEPENDENT_AMBULATORY_CARE_PROVIDER_SITE_OTHER): Payer: Self-pay | Admitting: Nurse Practitioner

## 2021-02-06 ENCOUNTER — Ambulatory Visit
Admission: RE | Admit: 2021-02-06 | Discharge: 2021-02-06 | Disposition: A | Discharge status: Home / Self Care | Payer: Medicare HMO | Attending: Vascular Surgery | Admitting: Vascular Surgery

## 2021-02-06 ENCOUNTER — Encounter
Admission: RE | Disposition: A | Discharge status: Home / Self Care | Payer: Self-pay | Source: Home / Self Care | Attending: Vascular Surgery

## 2021-02-06 ENCOUNTER — Ambulatory Visit: Payer: Medicare HMO

## 2021-02-06 ENCOUNTER — Inpatient Hospital Stay: Payer: Medicare HMO

## 2021-02-06 ENCOUNTER — Other Ambulatory Visit: Payer: Self-pay

## 2021-02-06 ENCOUNTER — Encounter: Payer: Self-pay | Admitting: Vascular Surgery

## 2021-02-06 DIAGNOSIS — C3490 Malignant neoplasm of unspecified part of unspecified bronchus or lung: Secondary | ICD-10-CM

## 2021-02-06 DIAGNOSIS — Z87891 Personal history of nicotine dependence: Secondary | ICD-10-CM | POA: Insufficient documentation

## 2021-02-06 DIAGNOSIS — Z79899 Other long term (current) drug therapy: Secondary | ICD-10-CM | POA: Insufficient documentation

## 2021-02-06 DIAGNOSIS — I252 Old myocardial infarction: Secondary | ICD-10-CM | POA: Insufficient documentation

## 2021-02-06 DIAGNOSIS — Z85828 Personal history of other malignant neoplasm of skin: Secondary | ICD-10-CM | POA: Diagnosis not present

## 2021-02-06 DIAGNOSIS — I2510 Atherosclerotic heart disease of native coronary artery without angina pectoris: Secondary | ICD-10-CM | POA: Insufficient documentation

## 2021-02-06 DIAGNOSIS — C7A1 Malignant poorly differentiated neuroendocrine tumors: Secondary | ICD-10-CM | POA: Insufficient documentation

## 2021-02-06 DIAGNOSIS — Z8616 Personal history of COVID-19: Secondary | ICD-10-CM | POA: Diagnosis not present

## 2021-02-06 DIAGNOSIS — C7931 Secondary malignant neoplasm of brain: Secondary | ICD-10-CM | POA: Insufficient documentation

## 2021-02-06 DIAGNOSIS — Z7982 Long term (current) use of aspirin: Secondary | ICD-10-CM | POA: Diagnosis not present

## 2021-02-06 DIAGNOSIS — I10 Essential (primary) hypertension: Secondary | ICD-10-CM | POA: Insufficient documentation

## 2021-02-06 DIAGNOSIS — Z801 Family history of malignant neoplasm of trachea, bronchus and lung: Secondary | ICD-10-CM | POA: Insufficient documentation

## 2021-02-06 DIAGNOSIS — Z809 Family history of malignant neoplasm, unspecified: Secondary | ICD-10-CM | POA: Insufficient documentation

## 2021-02-06 HISTORY — PX: PORTA CATH INSERTION: CATH118285

## 2021-02-06 SURGERY — PORTA CATH INSERTION
Anesthesia: Moderate Sedation

## 2021-02-06 MED ORDER — SODIUM CHLORIDE 0.9 % IV SOLN: INTRAVENOUS | Status: DC

## 2021-02-06 MED ORDER — CHLORHEXIDINE GLUCONATE CLOTH 2 % EX PADS
6.0000 | MEDICATED_PAD | Freq: Every day | CUTANEOUS | Status: DC
Start: 2021-02-07 — End: 2021-02-06
  Administered 2021-02-06: 6 via TOPICAL

## 2021-02-06 MED ORDER — MIDAZOLAM HCL 5 MG/5ML IJ SOLN
INTRAMUSCULAR | Status: AC
  Filled 2021-02-06: qty 5

## 2021-02-06 MED ORDER — CEFAZOLIN SODIUM-DEXTROSE 2-4 GM/100ML-% IV SOLN: 2.0000 g | Freq: Once | INTRAVENOUS | Status: DC

## 2021-02-06 MED ORDER — FENTANYL CITRATE (PF) 100 MCG/2ML IJ SOLN
INTRAMUSCULAR | Status: AC
  Filled 2021-02-06: qty 2

## 2021-02-06 MED ORDER — ONDANSETRON HCL 4 MG/2ML IJ SOLN: 4.0000 mg | Freq: Four times a day (QID) | INTRAMUSCULAR | Status: DC | PRN

## 2021-02-06 MED ORDER — MIDAZOLAM HCL 2 MG/2ML IJ SOLN
INTRAMUSCULAR | Status: DC | PRN
  Administered 2021-02-06: 1 mg via INTRAVENOUS
  Administered 2021-02-06: 2 mg via INTRAVENOUS

## 2021-02-06 MED ORDER — SODIUM CHLORIDE 0.9 % IV SOLN
Freq: Once | INTRAVENOUS | Status: DC
  Filled 2021-02-06: qty 2

## 2021-02-06 MED ORDER — HYDROMORPHONE HCL 1 MG/ML IJ SOLN: 1.0000 mg | Freq: Once | INTRAMUSCULAR | Status: DC | PRN

## 2021-02-06 MED ORDER — CEFAZOLIN SODIUM-DEXTROSE 2-4 GM/100ML-% IV SOLN
INTRAVENOUS | Status: AC
  Administered 2021-02-06: 2 g
  Filled 2021-02-06: qty 100

## 2021-02-06 MED ORDER — MIDAZOLAM HCL 2 MG/ML PO SYRP: 8.0000 mg | ORAL_SOLUTION | Freq: Once | ORAL | Status: DC | PRN

## 2021-02-06 MED ORDER — FAMOTIDINE 20 MG PO TABS: 40.0000 mg | ORAL_TABLET | Freq: Once | ORAL | Status: DC | PRN

## 2021-02-06 MED ORDER — FENTANYL CITRATE (PF) 100 MCG/2ML IJ SOLN
INTRAMUSCULAR | Status: DC | PRN
  Administered 2021-02-06: 25 ug via INTRAVENOUS
  Administered 2021-02-06: 50 ug via INTRAVENOUS

## 2021-02-06 MED ORDER — DIPHENHYDRAMINE HCL 50 MG/ML IJ SOLN: 50.0000 mg | Freq: Once | INTRAMUSCULAR | Status: DC | PRN

## 2021-02-06 MED ORDER — METHYLPREDNISOLONE SODIUM SUCC 125 MG IJ SOLR: 125.0000 mg | Freq: Once | INTRAMUSCULAR | Status: DC | PRN

## 2021-02-06 SURGICAL SUPPLY — 13 items
ADH SKN CLS APL DERMABOND .7 (GAUZE/BANDAGES/DRESSINGS) ×1
CATH PORTA XCELA P 8FR SL FA (Port) ×2 IMPLANT
DERMABOND ADVANCED (GAUZE/BANDAGES/DRESSINGS) ×1
DERMABOND ADVANCED .7 DNX12 (GAUZE/BANDAGES/DRESSINGS) ×1 IMPLANT
HANDLE YANKAUER SUCT BULB TIP (MISCELLANEOUS) ×2 IMPLANT
PACK ANGIOGRAPHY (CUSTOM PROCEDURE TRAY) ×2 IMPLANT
PENCIL ELECTRO HAND CTR (MISCELLANEOUS) ×2 IMPLANT
SPONGE XRAY 4X4 16PLY STRL (MISCELLANEOUS) ×2 IMPLANT
SUT MNCRL AB 4-0 PS2 18 (SUTURE) ×2 IMPLANT
SUT VIC AB 3-0 SH 27 (SUTURE) ×2
SUT VIC AB 3-0 SH 27X BRD (SUTURE) ×1 IMPLANT
TOWEL OR 17X26 4PK STRL BLUE (TOWEL DISPOSABLE) ×2 IMPLANT
TUBING CONNECTING 10 (TUBING) ×4 IMPLANT

## 2021-02-06 NOTE — Op Note (Signed)
      [facility]       Operative Note  Date: 02/06/2021  Preoperative diagnosis:  1. Metastatic lung cancer  Postoperative diagnosis:  Same as above  Procedures: #1. Ultrasound guidance for vascular access to the right internal jugular vein. #2. Fluoroscopic guidance for placement of catheter. #3. Placement of CT compatible Port-A-Cath, right internal jugular vein.  Surgeon: [name], MD.   Anesthesia: Local with moderate conscious sedation for approximately 18  minutes using 3 mg of Versed and 75 mcg of Fentanyl  Fluoroscopy time: less than 1 minute  Contrast used: 0  Estimated blood loss: 5 cc  Indication for the procedure:  The patient is a 75 y.o.male with lung cancer.  The patient needs a Port-A-Cath for durable venous access, chemotherapy, lab draws, and CT scans. We are asked to place this. Risks and benefits were discussed and informed consent was obtained.  Description of procedure: The patient was brought to the vascular and interventional radiology suite.  Moderate conscious sedation was administered throughout the procedure during a face to face encounter with the patient with my supervision of the RN administering medicines and monitoring the patient's vital signs, pulse oximetry, telemetry and mental status throughout from the start of the procedure until the patient was taken to the recovery room. The right neck chest and shoulder were sterilely prepped and draped, and a sterile surgical field was created. Ultrasound was used to help visualize a patent right internal jugular vein. This was then accessed under direct ultrasound guidance without difficulty with the Seldinger needle and a permanent image was recorded. A J-wire was placed. After skin nick and dilatation, the peel-away sheath was then placed over the wire. I then anesthetized an area under the clavicle approximately 1-2 fingerbreadths. A transverse incision was created and an inferior pocket  was created with electrocautery and blunt dissection. The port was then brought onto the field, placed into the pocket and secured to the chest wall with 2 Prolene sutures. The catheter was connected to the port and tunneled from the subclavicular incision to the access site. Fluoroscopic guidance was then used to cut the catheter to an appropriate length. The catheter was then placed through the peel-away sheath and the peel-away sheath was removed. The catheter tip was parked in excellent location under fluorocoscopic guidance in the cavoatrial junction. The pocket was then irrigated with antibiotic impregnated saline and the wound was closed with a running 3-0 Vicryl and a 4-0 Monocryl. The access incision was closed with a single 4-0 Monocryl. The Huber needle was used to withdraw blood and flush the port with heparinized saline. Dermabond was then placed as a dressing. The patient tolerated the procedure well and was taken to the recovery room in stable condition.   [name] 02/06/2021 2:22 PM   This note was created with Dragon Medical transcription system. Any errors in dictation are purely unintentional.

## 2021-02-06 NOTE — Discharge Instructions (Signed)
Moderate Conscious Sedation, Adult, Care After This sheet gives you information about how to care for yourself after your procedure. Your health care provider may also give you more specific instructions. If you have problems or questions, contact your health care provider. What can I expect after the procedure? After the procedure, it is common to have:  Sleepiness for several hours.  Impaired judgment for several hours.  Difficulty with balance.  Vomiting if you eat too soon. Follow these instructions at home: For the time period you were told by your health care provider:  Rest.  Do not participate in activities where you could fall or become injured.  Do not drive or use machinery.  Do not drink alcohol.  Do not take sleeping pills or medicines that cause drowsiness.  Do not make important decisions or sign legal documents.  Do not take care of children on your own.      Eating and drinking  Follow the diet recommended by your health care provider.  Drink enough fluid to keep your urine pale yellow.  If you vomit: ? Drink water, juice, or soup when you can drink without vomiting. ? Make sure you have little or no nausea before eating solid foods.   General instructions  Take over-the-counter and prescription medicines only as told by your health care provider.  Have a responsible adult stay with you for the time you are told. It is important to have someone help care for you until you are awake and alert.  Do not smoke.  Keep all follow-up visits as told by your health care provider. This is important. Contact a health care provider if:  You are still sleepy or having trouble with balance after 24 hours.  You feel light-headed.  You keep feeling nauseous or you keep vomiting.  You develop a rash.  You have a fever.  You have redness or swelling around the IV site. Get help right away if:  You have trouble breathing.  You have new-onset confusion at  home. Summary  After the procedure, it is common to feel sleepy, have impaired judgment, or feel nauseous if you eat too soon.  Rest after you get home. Know the things you should not do after the procedure.  Follow the diet recommended by your health care provider and drink enough fluid to keep your urine pale yellow.  Get help right away if you have trouble breathing or new-onset confusion at home. This information is not intended to replace advice given to you by your health care provider. Make sure you discuss any questions you have with your health care provider. Document Revised: 02/19/2020 Document Reviewed: 09/17/2019 Elsevier Patient Education  2021 [place] Insertion, Care After This sheet gives you information about how to care for yourself after your procedure. Your health care provider may also give you more specific instructions. If you have problems or questions, contact your health care provider. What can I expect after the procedure? After the procedure, it is common to have:  Discomfort at the port insertion site.  Bruising on the skin over the port. This should improve over 3-4 days. Follow these instructions at home: [facility] care  After your port is placed, you will get a manufacturer's information card. The card has information about your port. Keep this card with you at all times.  Take care of the port as told by your health care provider. Ask your health care provider if you or a family member can  get training for taking care of the port at home. A home health care nurse may also take care of the port.  Make sure to remember what type of port you have. Incision care  Follow instructions from your health care provider about how to take care of your port insertion site. Make sure you: ? Wash your hands with soap and water before and after you change your bandage (dressing). If soap and water are not available, use hand sanitizer. ? Change your  dressing as told by your health care provider. ? Leave stitches (sutures), skin glue, or adhesive strips in place. These skin closures may need to stay in place for 2 weeks or longer. If adhesive strip edges start to loosen and curl up, you may trim the loose edges. Do not remove adhesive strips completely unless your health care provider tells you to do that.  Check your port insertion site every day for signs of infection. Check for: ? Redness, swelling, or pain. ? Fluid or blood. ? Warmth. ? Pus or a bad smell.      Activity  Return to your normal activities as told by your health care provider. Ask your health care provider what activities are safe for you.  Do not lift anything that is heavier than 10 lb (4.5 kg), or the limit that you are told, until your health care provider says that it is safe. General instructions  Take over-the-counter and prescription medicines only as told by your health care provider.  Do not take baths, swim, or use a hot tub until your health care provider approves. Ask your health care provider if you may take showers. You may only be allowed to take sponge baths.  Do not drive for 24 hours if you were given a sedative during your procedure.  Wear a medical alert bracelet in case of an emergency. This will tell any health care providers that you have a port.  Keep all follow-up visits as told by your health care provider. This is important. Contact a health care provider if:  You cannot flush your port with saline as directed, or you cannot draw blood from the port.  You have a fever or chills.  You have redness, swelling, or pain around your port insertion site.  You have fluid or blood coming from your port insertion site.  Your port insertion site feels warm to the touch.  You have pus or a bad smell coming from the port insertion site. Get help right away if:  You have chest pain or shortness of breath.  You have bleeding from your port  that you cannot control. Summary  Take care of the port as told by your health care provider. Keep the manufacturer's information card with you at all times.  Change your dressing as told by your health care provider.  Contact a health care provider if you have a fever or chills or if you have redness, swelling, or pain around your port insertion site.  Keep all follow-up visits as told by your health care provider. This information is not intended to replace advice given to you by your health care provider. Make sure you discuss any questions you have with your health care provider. Document Revised: 05/20/2018 Document Reviewed: 05/20/2018 Elsevier Patient Education  [place].

## 2021-02-06 NOTE — Interval H&P Note (Signed)
History and Physical Interval Note:  02/06/2021 1:42 PM  Travis Marshall  has presented today for surgery, with the diagnosis of Porta Cath Placement   Lung Ca    Covid  March 31.  The various methods of treatment have been discussed with the patient and family. After consideration of risks, benefits and other options for treatment, the patient has consented to  Procedure(s): PORTA CATH INSERTION (N/A) as a surgical intervention.  The patient's history has been reviewed, patient examined, no change in status, stable for surgery.  I have reviewed the patient's chart and labs.  Questions were answered to the patient's satisfaction.     [name]

## 2021-02-07 ENCOUNTER — Ambulatory Visit
Admission: RE | Admit: 2021-02-07 | Discharge: 2021-02-07 | Disposition: A | Discharge status: Home / Self Care | Payer: Medicare HMO | Source: Ambulatory Visit | Attending: Radiation Oncology | Admitting: Radiation Oncology

## 2021-02-07 DIAGNOSIS — Z510 Encounter for antineoplastic radiation therapy: Secondary | ICD-10-CM | POA: Diagnosis not present

## 2021-02-07 DIAGNOSIS — C7931 Secondary malignant neoplasm of brain: Secondary | ICD-10-CM | POA: Diagnosis not present

## 2021-02-07 DIAGNOSIS — C7A8 Other malignant neuroendocrine tumors: Secondary | ICD-10-CM | POA: Diagnosis not present

## 2021-02-08 ENCOUNTER — Inpatient Hospital Stay: Payer: Medicare HMO

## 2021-02-08 ENCOUNTER — Encounter: Payer: Self-pay | Admitting: Oncology

## 2021-02-08 ENCOUNTER — Inpatient Hospital Stay (HOSPITAL_BASED_OUTPATIENT_CLINIC_OR_DEPARTMENT_OTHER): Payer: Medicare HMO | Admitting: Oncology

## 2021-02-08 ENCOUNTER — Ambulatory Visit
Admission: RE | Admit: 2021-02-08 | Discharge: 2021-02-08 | Disposition: A | Discharge status: Home / Self Care | Payer: Medicare HMO | Source: Ambulatory Visit | Attending: Radiation Oncology | Admitting: Radiation Oncology

## 2021-02-08 ENCOUNTER — Encounter: Payer: Self-pay | Admitting: *Deleted

## 2021-02-08 ENCOUNTER — Other Ambulatory Visit: Payer: Self-pay

## 2021-02-08 VITALS — BP 126/98 | HR 64 | Temp 95.5°F | Resp 16 | Wt 167.2 lb

## 2021-02-08 DIAGNOSIS — R5383 Other fatigue: Secondary | ICD-10-CM | POA: Diagnosis not present

## 2021-02-08 DIAGNOSIS — E860 Dehydration: Secondary | ICD-10-CM

## 2021-02-08 DIAGNOSIS — Z7189 Other specified counseling: Secondary | ICD-10-CM | POA: Diagnosis not present

## 2021-02-08 DIAGNOSIS — C7A1 Malignant poorly differentiated neuroendocrine tumors: Secondary | ICD-10-CM

## 2021-02-08 DIAGNOSIS — C7931 Secondary malignant neoplasm of brain: Secondary | ICD-10-CM

## 2021-02-08 DIAGNOSIS — I700 Atherosclerosis of aorta: Secondary | ICD-10-CM | POA: Diagnosis not present

## 2021-02-08 DIAGNOSIS — R739 Hyperglycemia, unspecified: Secondary | ICD-10-CM

## 2021-02-08 DIAGNOSIS — Z510 Encounter for antineoplastic radiation therapy: Secondary | ICD-10-CM | POA: Diagnosis not present

## 2021-02-08 DIAGNOSIS — Z5111 Encounter for antineoplastic chemotherapy: Secondary | ICD-10-CM | POA: Diagnosis not present

## 2021-02-08 DIAGNOSIS — J432 Centrilobular emphysema: Secondary | ICD-10-CM | POA: Diagnosis not present

## 2021-02-08 DIAGNOSIS — C7802 Secondary malignant neoplasm of left lung: Secondary | ICD-10-CM | POA: Diagnosis not present

## 2021-02-08 DIAGNOSIS — C7A8 Other malignant neuroendocrine tumors: Secondary | ICD-10-CM | POA: Diagnosis not present

## 2021-02-08 DIAGNOSIS — C7B01 Secondary carcinoid tumors of distant lymph nodes: Secondary | ICD-10-CM | POA: Diagnosis not present

## 2021-02-08 DIAGNOSIS — C7B09 Secondary carcinoid tumors of other sites: Secondary | ICD-10-CM | POA: Diagnosis not present

## 2021-02-08 DIAGNOSIS — R531 Weakness: Secondary | ICD-10-CM | POA: Diagnosis not present

## 2021-02-08 LAB — CBC WITH DIFFERENTIAL/PLATELET
Abs Immature Granulocytes: 0.28 10*3/uL — ABNORMAL HIGH (ref 0.00–0.07)
Basophils Absolute: 0 10*3/uL (ref 0.0–0.1)
Basophils Relative: 0 %
Eosinophils Absolute: 0 10*3/uL (ref 0.0–0.5)
Eosinophils Relative: 0 %
HCT: 42.6 % (ref 39.0–52.0)
Hemoglobin: 15.1 g/dL (ref 13.0–17.0)
Immature Granulocytes: 3 %
Lymphocytes Relative: 10 %
Lymphs Abs: 1 10*3/uL (ref 0.7–4.0)
MCH: 32.3 pg (ref 26.0–34.0)
MCHC: 35.4 g/dL (ref 30.0–36.0)
MCV: 91.2 fL (ref 80.0–100.0)
Monocytes Absolute: 0.1 10*3/uL (ref 0.1–1.0)
Monocytes Relative: 1 %
Neutro Abs: 8.1 10*3/uL — ABNORMAL HIGH (ref 1.7–7.7)
Neutrophils Relative %: 86 %
Platelets: 204 10*3/uL (ref 150–400)
RBC: 4.67 MIL/uL (ref 4.22–5.81)
RDW: 11.9 % (ref 11.5–15.5)
Smear Review: NORMAL
WBC: 9.4 10*3/uL (ref 4.0–10.5)
nRBC: 0 % (ref 0.0–0.2)

## 2021-02-08 LAB — COMPREHENSIVE METABOLIC PANEL
ALT: 17 U/L (ref 0–44)
AST: 18 U/L (ref 15–41)
Albumin: 3.8 g/dL (ref 3.5–5.0)
Alkaline Phosphatase: 40 U/L (ref 38–126)
Anion gap: 12 (ref 5–15)
BUN: 25 mg/dL — ABNORMAL HIGH (ref 8–23)
CO2: 23 mmol/L (ref 22–32)
Calcium: 9.1 mg/dL (ref 8.9–10.3)
Chloride: 98 mmol/L (ref 98–111)
Creatinine, Ser: 0.88 mg/dL (ref 0.61–1.24)
GFR, Estimated: >60 mL/min (ref >60)
Glucose, Bld: 307 mg/dL — ABNORMAL HIGH (ref 70–99)
Potassium: 4.3 mmol/L (ref 3.5–5.1)
Sodium: 133 mmol/L — ABNORMAL LOW (ref 135–145)
Total Bilirubin: 1 mg/dL (ref 0.3–1.2)
Total Protein: 6.6 g/dL (ref 6.5–8.1)

## 2021-02-08 MED ORDER — HEPARIN SOD (PORK) LOCK FLUSH 100 UNIT/ML IV SOLN
500.0000 [IU] | Freq: Once | INTRAVENOUS | Status: AC
  Administered 2021-02-08: 500 [IU] via INTRAVENOUS
  Filled 2021-02-08: qty 5

## 2021-02-08 MED ORDER — LEVETIRACETAM 500 MG PO TABS: 500.0000 mg | ORAL_TABLET | Freq: Two times a day (BID) | ORAL | 1 refills | Status: DC

## 2021-02-08 MED ORDER — SODIUM CHLORIDE 0.9% FLUSH
10.0000 mL | INTRAVENOUS | Status: DC | PRN
  Administered 2021-02-08: 10 mL via INTRAVENOUS
  Filled 2021-02-08: qty 10

## 2021-02-08 MED ORDER — SODIUM CHLORIDE 0.9 % IV SOLN
Freq: Once | INTRAVENOUS | Status: AC
  Filled 2021-02-08: qty 250

## 2021-02-08 NOTE — Progress Notes (Signed)
Pt received 1 liter of NS for hydration.Pt ate crackers and had a drink. Offers no complaints. He was ready for discharge to home. Accompanied by wife.
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
## 2021-02-08 NOTE — Progress Notes (Signed)
Patient denies new problems/concerns today.   °

## 2021-02-08 NOTE — Progress Notes (Signed)
  Oncology Nurse Navigator Documentation  Navigator Location: [facility] (02/08/21 1000)   )Navigator Encounter Type: Follow-up Appt (02/08/21 1000)                     Patient Visit Type: MedOnc (02/08/21 1000)   Barriers/Navigation Needs: No Barriers At This Time;No Needs;No Questions (02/08/21 1000)   Interventions: None Required (02/08/21 1000)         met with patient during follow up visit after receiving chemo last week. Pt stated that he tolerated the treatment well with little to no side effects. No further barriers identified at this time. Pt instructed to call with any questions or needs. Pt verbalized understanding.              Time Spent with Patient: 30 (02/08/21 1000)

## 2021-02-09 ENCOUNTER — Ambulatory Visit
Admission: RE | Admit: 2021-02-09 | Discharge: 2021-02-09 | Disposition: A | Discharge status: Home / Self Care | Payer: Medicare HMO | Source: Ambulatory Visit | Attending: Radiation Oncology | Admitting: Radiation Oncology

## 2021-02-09 DIAGNOSIS — Z510 Encounter for antineoplastic radiation therapy: Secondary | ICD-10-CM | POA: Diagnosis not present

## 2021-02-09 DIAGNOSIS — C7931 Secondary malignant neoplasm of brain: Secondary | ICD-10-CM | POA: Diagnosis not present

## 2021-02-09 DIAGNOSIS — C7A8 Other malignant neuroendocrine tumors: Secondary | ICD-10-CM | POA: Diagnosis not present

## 2021-02-10 ENCOUNTER — Ambulatory Visit
Admission: RE | Admit: 2021-02-10 | Discharge: 2021-02-10 | Disposition: A | Discharge status: Home / Self Care | Payer: Medicare HMO | Source: Ambulatory Visit | Attending: Radiation Oncology | Admitting: Radiation Oncology

## 2021-02-10 DIAGNOSIS — Z510 Encounter for antineoplastic radiation therapy: Secondary | ICD-10-CM | POA: Diagnosis not present

## 2021-02-10 DIAGNOSIS — Z87891 Personal history of nicotine dependence: Secondary | ICD-10-CM | POA: Diagnosis not present

## 2021-02-10 DIAGNOSIS — C7A8 Other malignant neuroendocrine tumors: Secondary | ICD-10-CM | POA: Diagnosis not present

## 2021-02-10 DIAGNOSIS — C3431 Malignant neoplasm of lower lobe, right bronchus or lung: Secondary | ICD-10-CM | POA: Diagnosis not present

## 2021-02-10 DIAGNOSIS — C7931 Secondary malignant neoplasm of brain: Secondary | ICD-10-CM | POA: Diagnosis not present

## 2021-02-13 ENCOUNTER — Ambulatory Visit
Admission: RE | Admit: 2021-02-13 | Discharge: 2021-02-13 | Disposition: A | Discharge status: Home / Self Care | Payer: Medicare HMO | Source: Ambulatory Visit | Attending: Radiation Oncology | Admitting: Radiation Oncology

## 2021-02-13 DIAGNOSIS — C7A8 Other malignant neuroendocrine tumors: Secondary | ICD-10-CM | POA: Diagnosis not present

## 2021-02-13 DIAGNOSIS — C7931 Secondary malignant neoplasm of brain: Secondary | ICD-10-CM | POA: Diagnosis not present

## 2021-02-13 DIAGNOSIS — Z510 Encounter for antineoplastic radiation therapy: Secondary | ICD-10-CM | POA: Diagnosis not present

## 2021-02-14 ENCOUNTER — Ambulatory Visit
Admission: RE | Admit: 2021-02-14 | Discharge: 2021-02-14 | Disposition: A | Discharge status: Home / Self Care | Payer: Medicare HMO | Source: Ambulatory Visit | Attending: Radiation Oncology | Admitting: Radiation Oncology

## 2021-02-14 ENCOUNTER — Telehealth: Payer: Self-pay

## 2021-02-14 DIAGNOSIS — C7931 Secondary malignant neoplasm of brain: Secondary | ICD-10-CM | POA: Diagnosis not present

## 2021-02-14 DIAGNOSIS — Z510 Encounter for antineoplastic radiation therapy: Secondary | ICD-10-CM | POA: Diagnosis not present

## 2021-02-14 DIAGNOSIS — C7A1 Malignant poorly differentiated neuroendocrine tumors: Secondary | ICD-10-CM

## 2021-02-14 DIAGNOSIS — C7A8 Other malignant neuroendocrine tumors: Secondary | ICD-10-CM | POA: Diagnosis not present

## 2021-02-14 NOTE — Telephone Encounter (Signed)
Message received from radiation dept Tamika Y:  This patient had a 10lb weight loss since we weighed him last Tuesday. Patient reports eating well and not having diarrhea. Just wanted you to know for FYI.     Dr. Tasia Catchings wants to refer patient to nutrition.  Order entered.

## 2021-02-15 ENCOUNTER — Other Ambulatory Visit: Payer: Self-pay | Admitting: *Deleted

## 2021-02-15 ENCOUNTER — Ambulatory Visit
Admission: RE | Admit: 2021-02-15 | Discharge: 2021-02-15 | Disposition: A | Discharge status: Home / Self Care | Payer: Medicare HMO | Source: Ambulatory Visit | Attending: Radiation Oncology | Admitting: Radiation Oncology

## 2021-02-15 DIAGNOSIS — C7931 Secondary malignant neoplasm of brain: Secondary | ICD-10-CM | POA: Diagnosis not present

## 2021-02-15 DIAGNOSIS — Z510 Encounter for antineoplastic radiation therapy: Secondary | ICD-10-CM | POA: Diagnosis not present

## 2021-02-15 DIAGNOSIS — C7A8 Other malignant neuroendocrine tumors: Secondary | ICD-10-CM | POA: Diagnosis not present

## 2021-02-15 MED ORDER — DEXAMETHASONE 4 MG PO TABS: 8.0000 mg | ORAL_TABLET | Freq: Two times a day (BID) | ORAL | 0 refills | Status: DC

## 2021-02-16 ENCOUNTER — Ambulatory Visit
Admission: RE | Admit: 2021-02-16 | Discharge: 2021-02-16 | Disposition: A | Discharge status: Home / Self Care | Payer: Medicare HMO | Source: Ambulatory Visit | Attending: Radiation Oncology | Admitting: Radiation Oncology

## 2021-02-16 DIAGNOSIS — C7A8 Other malignant neuroendocrine tumors: Secondary | ICD-10-CM | POA: Diagnosis not present

## 2021-02-16 DIAGNOSIS — C3431 Malignant neoplasm of lower lobe, right bronchus or lung: Secondary | ICD-10-CM | POA: Diagnosis not present

## 2021-02-16 DIAGNOSIS — Z510 Encounter for antineoplastic radiation therapy: Secondary | ICD-10-CM | POA: Diagnosis not present

## 2021-02-16 DIAGNOSIS — C7931 Secondary malignant neoplasm of brain: Secondary | ICD-10-CM | POA: Diagnosis not present

## 2021-02-16 DIAGNOSIS — Z87891 Personal history of nicotine dependence: Secondary | ICD-10-CM | POA: Diagnosis not present

## 2021-02-17 ENCOUNTER — Ambulatory Visit: Payer: Medicare HMO

## 2021-02-17 ENCOUNTER — Ambulatory Visit
Admission: RE | Admit: 2021-02-17 | Discharge: 2021-02-17 | Disposition: A | Discharge status: Home / Self Care | Payer: Medicare HMO | Source: Ambulatory Visit | Attending: Radiation Oncology | Admitting: Radiation Oncology

## 2021-02-17 DIAGNOSIS — Z87891 Personal history of nicotine dependence: Secondary | ICD-10-CM | POA: Diagnosis not present

## 2021-02-17 DIAGNOSIS — C7A8 Other malignant neuroendocrine tumors: Secondary | ICD-10-CM | POA: Diagnosis not present

## 2021-02-17 DIAGNOSIS — C7931 Secondary malignant neoplasm of brain: Secondary | ICD-10-CM | POA: Diagnosis not present

## 2021-02-17 DIAGNOSIS — Z510 Encounter for antineoplastic radiation therapy: Secondary | ICD-10-CM | POA: Diagnosis not present

## 2021-02-17 DIAGNOSIS — C3431 Malignant neoplasm of lower lobe, right bronchus or lung: Secondary | ICD-10-CM | POA: Diagnosis not present

## 2021-02-20 ENCOUNTER — Ambulatory Visit
Admission: RE | Admit: 2021-02-20 | Discharge: 2021-02-20 | Disposition: A | Discharge status: Home / Self Care | Payer: Medicare HMO | Source: Ambulatory Visit | Attending: Radiation Oncology | Admitting: Radiation Oncology

## 2021-02-20 ENCOUNTER — Ambulatory Visit: Admission: RE | Admit: 2021-02-20 | Payer: Medicare HMO | Source: Ambulatory Visit

## 2021-02-20 ENCOUNTER — Ambulatory Visit: Payer: Medicare HMO

## 2021-02-20 DIAGNOSIS — Z510 Encounter for antineoplastic radiation therapy: Secondary | ICD-10-CM | POA: Diagnosis not present

## 2021-02-20 DIAGNOSIS — C7A8 Other malignant neuroendocrine tumors: Secondary | ICD-10-CM | POA: Diagnosis not present

## 2021-02-20 DIAGNOSIS — C7931 Secondary malignant neoplasm of brain: Secondary | ICD-10-CM | POA: Diagnosis not present

## 2021-02-21 ENCOUNTER — Ambulatory Visit
Admission: RE | Admit: 2021-02-21 | Discharge: 2021-02-21 | Disposition: A | Discharge status: Home / Self Care | Payer: Medicare HMO | Source: Ambulatory Visit | Attending: Radiation Oncology | Admitting: Radiation Oncology

## 2021-02-21 DIAGNOSIS — Z510 Encounter for antineoplastic radiation therapy: Secondary | ICD-10-CM | POA: Diagnosis not present

## 2021-02-21 DIAGNOSIS — C7A8 Other malignant neuroendocrine tumors: Secondary | ICD-10-CM | POA: Diagnosis not present

## 2021-02-21 DIAGNOSIS — C3431 Malignant neoplasm of lower lobe, right bronchus or lung: Secondary | ICD-10-CM | POA: Diagnosis not present

## 2021-02-21 DIAGNOSIS — Z87891 Personal history of nicotine dependence: Secondary | ICD-10-CM | POA: Diagnosis not present

## 2021-02-21 DIAGNOSIS — C7931 Secondary malignant neoplasm of brain: Secondary | ICD-10-CM | POA: Diagnosis not present

## 2021-02-22 ENCOUNTER — Emergency Department
Admission: EM | Admit: 2021-02-22 | Discharge: 2021-02-22 | Disposition: A | Discharge status: Home / Self Care | Payer: Medicare HMO | Attending: Emergency Medicine | Admitting: Emergency Medicine

## 2021-02-22 ENCOUNTER — Other Ambulatory Visit: Payer: Self-pay

## 2021-02-22 ENCOUNTER — Inpatient Hospital Stay: Payer: Medicare HMO

## 2021-02-22 ENCOUNTER — Inpatient Hospital Stay (HOSPITAL_BASED_OUTPATIENT_CLINIC_OR_DEPARTMENT_OTHER): Payer: Medicare HMO | Admitting: Oncology

## 2021-02-22 ENCOUNTER — Encounter: Payer: Self-pay | Admitting: Oncology

## 2021-02-22 ENCOUNTER — Ambulatory Visit
Admission: RE | Admit: 2021-02-22 | Discharge: 2021-02-22 | Disposition: A | Discharge status: Home / Self Care | Payer: Medicare HMO | Source: Ambulatory Visit | Attending: Radiation Oncology | Admitting: Radiation Oncology

## 2021-02-22 VITALS — BP 132/82 | HR 58 | Temp 97.9°F | Resp 18 | Wt 150.6 lb

## 2021-02-22 DIAGNOSIS — R739 Hyperglycemia, unspecified: Secondary | ICD-10-CM

## 2021-02-22 DIAGNOSIS — E860 Dehydration: Secondary | ICD-10-CM | POA: Diagnosis not present

## 2021-02-22 DIAGNOSIS — Z85841 Personal history of malignant neoplasm of brain: Secondary | ICD-10-CM | POA: Diagnosis not present

## 2021-02-22 DIAGNOSIS — E1165 Type 2 diabetes mellitus with hyperglycemia: Secondary | ICD-10-CM | POA: Diagnosis not present

## 2021-02-22 DIAGNOSIS — Z7982 Long term (current) use of aspirin: Secondary | ICD-10-CM | POA: Diagnosis not present

## 2021-02-22 DIAGNOSIS — Z5111 Encounter for antineoplastic chemotherapy: Secondary | ICD-10-CM

## 2021-02-22 DIAGNOSIS — Z87891 Personal history of nicotine dependence: Secondary | ICD-10-CM | POA: Insufficient documentation

## 2021-02-22 DIAGNOSIS — C7802 Secondary malignant neoplasm of left lung: Secondary | ICD-10-CM | POA: Diagnosis not present

## 2021-02-22 DIAGNOSIS — Z85118 Personal history of other malignant neoplasm of bronchus and lung: Secondary | ICD-10-CM | POA: Insufficient documentation

## 2021-02-22 DIAGNOSIS — R5383 Other fatigue: Secondary | ICD-10-CM | POA: Insufficient documentation

## 2021-02-22 DIAGNOSIS — Z85828 Personal history of other malignant neoplasm of skin: Secondary | ICD-10-CM | POA: Insufficient documentation

## 2021-02-22 DIAGNOSIS — C7A1 Malignant poorly differentiated neuroendocrine tumors: Secondary | ICD-10-CM | POA: Diagnosis not present

## 2021-02-22 DIAGNOSIS — E7132 Disorders of ketone metabolism: Secondary | ICD-10-CM | POA: Diagnosis not present

## 2021-02-22 DIAGNOSIS — C7931 Secondary malignant neoplasm of brain: Secondary | ICD-10-CM

## 2021-02-22 DIAGNOSIS — C3431 Malignant neoplasm of lower lobe, right bronchus or lung: Secondary | ICD-10-CM | POA: Diagnosis not present

## 2021-02-22 DIAGNOSIS — Z951 Presence of aortocoronary bypass graft: Secondary | ICD-10-CM | POA: Insufficient documentation

## 2021-02-22 DIAGNOSIS — I2510 Atherosclerotic heart disease of native coronary artery without angina pectoris: Secondary | ICD-10-CM | POA: Insufficient documentation

## 2021-02-22 DIAGNOSIS — Z79899 Other long term (current) drug therapy: Secondary | ICD-10-CM | POA: Insufficient documentation

## 2021-02-22 LAB — URINALYSIS, COMPLETE (UACMP) WITH MICROSCOPIC
Bilirubin Urine: NEGATIVE
Glucose, UA: >=500 mg/dL — AB
Ketones, ur: 5 mg/dL — AB
Leukocytes,Ua: NEGATIVE
Nitrite: NEGATIVE
Protein, ur: NEGATIVE mg/dL
Specific Gravity, Urine: 1.04 — ABNORMAL HIGH (ref 1.005–1.030)
Squamous Epithelial / HPF: NONE SEEN (ref 0–5)
pH: 5 (ref 5.0–8.0)

## 2021-02-22 LAB — COMPREHENSIVE METABOLIC PANEL
ALT: 41 U/L (ref 0–44)
AST: 20 U/L (ref 15–41)
Albumin: 3.3 g/dL — ABNORMAL LOW (ref 3.5–5.0)
Alkaline Phosphatase: 44 U/L (ref 38–126)
Anion gap: 10 (ref 5–15)
BUN: 28 mg/dL — ABNORMAL HIGH (ref 8–23)
CO2: 24 mmol/L (ref 22–32)
Calcium: 8.8 mg/dL — ABNORMAL LOW (ref 8.9–10.3)
Chloride: 96 mmol/L — ABNORMAL LOW (ref 98–111)
Creatinine, Ser: 0.78 mg/dL (ref 0.61–1.24)
GFR, Estimated: >60 mL/min (ref >60)
Glucose, Bld: 453 mg/dL — ABNORMAL HIGH (ref 70–99)
Potassium: 4.2 mmol/L (ref 3.5–5.1)
Sodium: 130 mmol/L — ABNORMAL LOW (ref 135–145)
Total Bilirubin: 1.2 mg/dL (ref 0.3–1.2)
Total Protein: 6 g/dL — ABNORMAL LOW (ref 6.5–8.1)

## 2021-02-22 LAB — CBG MONITORING, ED
Glucose-Capillary: 439 mg/dL — ABNORMAL HIGH (ref 70–99)
Glucose-Capillary: 361 mg/dL — ABNORMAL HIGH (ref 70–99)
Glucose-Capillary: 322 mg/dL — ABNORMAL HIGH (ref 70–99)
Glucose-Capillary: 245 mg/dL — ABNORMAL HIGH (ref 70–99)

## 2021-02-22 LAB — CBC WITH DIFFERENTIAL/PLATELET
Abs Immature Granulocytes: 0.4 10*3/uL — ABNORMAL HIGH (ref 0.00–0.07)
Band Neutrophils: 12 %
Basophils Absolute: 0 10*3/uL (ref 0.0–0.1)
Basophils Relative: 0 %
Eosinophils Absolute: 0 10*3/uL (ref 0.0–0.5)
Eosinophils Relative: 0 %
HCT: 42.5 % (ref 39.0–52.0)
Hemoglobin: 15.5 g/dL (ref 13.0–17.0)
Lymphocytes Relative: 4 %
Lymphs Abs: 0.6 10*3/uL — ABNORMAL LOW (ref 0.7–4.0)
MCH: 32.6 pg (ref 26.0–34.0)
MCHC: 36.5 g/dL — ABNORMAL HIGH (ref 30.0–36.0)
MCV: 89.5 fL (ref 80.0–100.0)
Metamyelocytes Relative: 1 %
Monocytes Absolute: 0.6 10*3/uL (ref 0.1–1.0)
Monocytes Relative: 4 %
Myelocytes: 2 %
Neutro Abs: 12.5 10*3/uL — ABNORMAL HIGH (ref 1.7–7.7)
Neutrophils Relative %: 77 %
Platelets: 135 10*3/uL — ABNORMAL LOW (ref 150–400)
RBC: 4.75 MIL/uL (ref 4.22–5.81)
RDW: 11.8 % (ref 11.5–15.5)
Smear Review: NORMAL
WBC: 14.1 10*3/uL — ABNORMAL HIGH (ref 4.0–10.5)
nRBC: 0 % (ref 0.0–0.2)

## 2021-02-22 LAB — HEMOGLOBIN A1C
Hgb A1c MFr Bld: 9.7 % — ABNORMAL HIGH (ref 4.8–5.6)
Mean Plasma Glucose: 231.69 mg/dL

## 2021-02-22 LAB — TSH: TSH: 0.225 u[IU]/mL — ABNORMAL LOW (ref 0.350–4.500)

## 2021-02-22 MED ORDER — INSULIN ASPART 100 UNIT/ML ~~LOC~~ SOLN
7.0000 [IU] | Freq: Once | SUBCUTANEOUS | Status: AC
  Administered 2021-02-22: 7 [IU] via INTRAVENOUS
  Filled 2021-02-22: qty 1

## 2021-02-22 MED ORDER — HEPARIN SOD (PORK) LOCK FLUSH 100 UNIT/ML IV SOLN
INTRAVENOUS | Status: AC
  Filled 2021-02-22: qty 5

## 2021-02-22 MED ORDER — SODIUM CHLORIDE 0.9% FLUSH
10.0000 mL | INTRAVENOUS | Status: DC | PRN
  Administered 2021-02-22: 10 mL via INTRAVENOUS
  Filled 2021-02-22: qty 10

## 2021-02-22 MED ORDER — LACTATED RINGERS IV BOLUS
500.0000 mL | Freq: Once | INTRAVENOUS | Status: AC
  Administered 2021-02-22: 500 mL via INTRAVENOUS

## 2021-02-22 MED ORDER — LACTATED RINGERS IV BOLUS
1000.0000 mL | Freq: Once | INTRAVENOUS | Status: AC
  Administered 2021-02-22: 1000 mL via INTRAVENOUS

## 2021-02-22 MED ORDER — INSULIN ASPART 100 UNIT/ML ~~LOC~~ SOLN
5.0000 [IU] | Freq: Once | SUBCUTANEOUS | Status: AC
  Administered 2021-02-22: 5 [IU] via INTRAVENOUS
  Filled 2021-02-22: qty 1

## 2021-02-22 MED ORDER — HEPARIN SOD (PORK) LOCK FLUSH 100 UNIT/ML IV SOLN
500.0000 [IU] | Freq: Once | INTRAVENOUS | Status: AC
  Administered 2021-02-22: 500 [IU] via INTRAVENOUS
  Filled 2021-02-22: qty 5
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
## 2021-02-22 NOTE — ED Notes (Signed)
Pt verbalizes understanding of d/c instructions and follow up

## 2021-02-22 NOTE — ED Notes (Signed)
ED Provider at bedside. 

## 2021-02-22 NOTE — Discharge Instructions (Addendum)
Avoid soda or food high in sugar Avoid high carbohydrate foods like bread, pasta. Try to eat smaller servings.

## 2021-02-22 NOTE — ED Triage Notes (Signed)
Pt comes with c/o hyperglycemia. Pt went to cancer center for treatment today. Pt's CBG was checked and was over 400.  Pt denies any complaints.  Pt has lung cancer that has mets to brain.

## 2021-02-22 NOTE — Progress Notes (Signed)
Patient here for follow up. Pt reports feeling tired all the time.  

## 2021-02-23 ENCOUNTER — Ambulatory Visit: Payer: Medicare HMO

## 2021-02-23 ENCOUNTER — Inpatient Hospital Stay: Payer: Medicare HMO

## 2021-02-23 ENCOUNTER — Ambulatory Visit
Admission: RE | Admit: 2021-02-23 | Discharge: 2021-02-23 | Disposition: A | Discharge status: Home / Self Care | Payer: Medicare HMO | Source: Ambulatory Visit | Attending: Radiation Oncology | Admitting: Radiation Oncology

## 2021-02-23 DIAGNOSIS — C7931 Secondary malignant neoplasm of brain: Secondary | ICD-10-CM | POA: Diagnosis not present

## 2021-02-23 DIAGNOSIS — C7A8 Other malignant neuroendocrine tumors: Secondary | ICD-10-CM | POA: Diagnosis not present

## 2021-02-23 DIAGNOSIS — Z87891 Personal history of nicotine dependence: Secondary | ICD-10-CM | POA: Diagnosis not present

## 2021-02-23 DIAGNOSIS — C3431 Malignant neoplasm of lower lobe, right bronchus or lung: Secondary | ICD-10-CM | POA: Diagnosis not present

## 2021-02-23 DIAGNOSIS — Z510 Encounter for antineoplastic radiation therapy: Secondary | ICD-10-CM | POA: Diagnosis not present

## 2021-02-24 ENCOUNTER — Inpatient Hospital Stay: Payer: Medicare HMO

## 2021-02-24 ENCOUNTER — Ambulatory Visit
Admission: RE | Admit: 2021-02-24 | Discharge: 2021-02-24 | Disposition: A | Discharge status: Home / Self Care | Payer: Medicare HMO | Source: Ambulatory Visit | Attending: Radiation Oncology | Admitting: Radiation Oncology

## 2021-02-24 DIAGNOSIS — Z87891 Personal history of nicotine dependence: Secondary | ICD-10-CM | POA: Diagnosis not present

## 2021-02-24 DIAGNOSIS — C7A8 Other malignant neuroendocrine tumors: Secondary | ICD-10-CM | POA: Diagnosis not present

## 2021-02-24 DIAGNOSIS — Z510 Encounter for antineoplastic radiation therapy: Secondary | ICD-10-CM | POA: Diagnosis not present

## 2021-02-24 DIAGNOSIS — C7931 Secondary malignant neoplasm of brain: Secondary | ICD-10-CM | POA: Diagnosis not present

## 2021-02-24 DIAGNOSIS — C3431 Malignant neoplasm of lower lobe, right bronchus or lung: Secondary | ICD-10-CM | POA: Diagnosis not present

## 2021-02-27 ENCOUNTER — Inpatient Hospital Stay: Payer: Medicare HMO

## 2021-03-01 ENCOUNTER — Inpatient Hospital Stay (HOSPITAL_BASED_OUTPATIENT_CLINIC_OR_DEPARTMENT_OTHER): Payer: Medicare HMO | Admitting: Oncology

## 2021-03-01 ENCOUNTER — Encounter: Payer: Self-pay | Admitting: Oncology

## 2021-03-01 ENCOUNTER — Inpatient Hospital Stay: Payer: Medicare HMO

## 2021-03-01 VITALS — BP 101/62 | HR 63 | Temp 96.5°F | Resp 18 | Wt 143.4 lb

## 2021-03-01 DIAGNOSIS — C7802 Secondary malignant neoplasm of left lung: Secondary | ICD-10-CM

## 2021-03-01 DIAGNOSIS — C7B09 Secondary carcinoid tumors of other sites: Secondary | ICD-10-CM | POA: Diagnosis not present

## 2021-03-01 DIAGNOSIS — C7931 Secondary malignant neoplasm of brain: Secondary | ICD-10-CM

## 2021-03-01 DIAGNOSIS — C7A1 Malignant poorly differentiated neuroendocrine tumors: Secondary | ICD-10-CM | POA: Diagnosis not present

## 2021-03-01 DIAGNOSIS — J432 Centrilobular emphysema: Secondary | ICD-10-CM | POA: Diagnosis not present

## 2021-03-01 DIAGNOSIS — C7B01 Secondary carcinoid tumors of distant lymph nodes: Secondary | ICD-10-CM | POA: Diagnosis not present

## 2021-03-01 DIAGNOSIS — R739 Hyperglycemia, unspecified: Secondary | ICD-10-CM | POA: Diagnosis not present

## 2021-03-01 DIAGNOSIS — R531 Weakness: Secondary | ICD-10-CM | POA: Diagnosis not present

## 2021-03-01 DIAGNOSIS — R5383 Other fatigue: Secondary | ICD-10-CM | POA: Diagnosis not present

## 2021-03-01 DIAGNOSIS — Z5111 Encounter for antineoplastic chemotherapy: Secondary | ICD-10-CM

## 2021-03-01 DIAGNOSIS — I700 Atherosclerosis of aorta: Secondary | ICD-10-CM | POA: Diagnosis not present

## 2021-03-01 LAB — COMPREHENSIVE METABOLIC PANEL
ALT: 31 U/L (ref 0–44)
AST: 20 U/L (ref 15–41)
Albumin: 3.3 g/dL — ABNORMAL LOW (ref 3.5–5.0)
Alkaline Phosphatase: 36 U/L — ABNORMAL LOW (ref 38–126)
Anion gap: 9 (ref 5–15)
BUN: 28 mg/dL — ABNORMAL HIGH (ref 8–23)
CO2: 25 mmol/L (ref 22–32)
Calcium: 8.4 mg/dL — ABNORMAL LOW (ref 8.9–10.3)
Chloride: 100 mmol/L (ref 98–111)
Creatinine, Ser: 0.69 mg/dL (ref 0.61–1.24)
GFR, Estimated: >60 mL/min (ref >60)
Glucose, Bld: 275 mg/dL — ABNORMAL HIGH (ref 70–99)
Potassium: 3.5 mmol/L (ref 3.5–5.1)
Sodium: 134 mmol/L — ABNORMAL LOW (ref 135–145)
Total Bilirubin: 1.6 mg/dL — ABNORMAL HIGH (ref 0.3–1.2)
Total Protein: 5.6 g/dL — ABNORMAL LOW (ref 6.5–8.1)

## 2021-03-01 LAB — CBC WITH DIFFERENTIAL/PLATELET
Abs Immature Granulocytes: 0.31 10*3/uL — ABNORMAL HIGH (ref 0.00–0.07)
Basophils Absolute: 0 10*3/uL (ref 0.0–0.1)
Basophils Relative: 0 %
Eosinophils Absolute: 0 10*3/uL (ref 0.0–0.5)
Eosinophils Relative: 0 %
HCT: 41.5 % (ref 39.0–52.0)
Hemoglobin: 15.4 g/dL (ref 13.0–17.0)
Immature Granulocytes: 4 %
Lymphocytes Relative: 18 %
Lymphs Abs: 1.6 10*3/uL (ref 0.7–4.0)
MCH: 32.3 pg (ref 26.0–34.0)
MCHC: 37.1 g/dL — ABNORMAL HIGH (ref 30.0–36.0)
MCV: 87 fL (ref 80.0–100.0)
Monocytes Absolute: 0.4 10*3/uL (ref 0.1–1.0)
Monocytes Relative: 5 %
Neutro Abs: 6.4 10*3/uL (ref 1.7–7.7)
Neutrophils Relative %: 73 %
Platelets: 82 10*3/uL — ABNORMAL LOW (ref 150–400)
RBC: 4.77 MIL/uL (ref 4.22–5.81)
RDW: 11.9 % (ref 11.5–15.5)
WBC: 8.7 10*3/uL (ref 4.0–10.5)
nRBC: 0 % (ref 0.0–0.2)

## 2021-03-01 LAB — TSH: TSH: 1.605 u[IU]/mL (ref 0.350–4.500)

## 2021-03-01 MED ORDER — HEPARIN SOD (PORK) LOCK FLUSH 100 UNIT/ML IV SOLN
INTRAVENOUS | Status: AC
  Filled 2021-03-01: qty 5

## 2021-03-01 MED ORDER — SODIUM CHLORIDE 0.9 % IV SOLN
Freq: Once | INTRAVENOUS | Status: AC
  Filled 2021-03-01: qty 250

## 2021-03-01 MED ORDER — SODIUM CHLORIDE 0.9% FLUSH
10.0000 mL | Freq: Once | INTRAVENOUS | Status: DC
  Filled 2021-03-01: qty 10

## 2021-03-01 MED ORDER — HEPARIN SOD (PORK) LOCK FLUSH 100 UNIT/ML IV SOLN
500.0000 [IU] | Freq: Once | INTRAVENOUS | Status: AC
  Administered 2021-03-01: 500 [IU] via INTRAVENOUS
  Filled 2021-03-01: qty 5

## 2021-03-01 NOTE — Progress Notes (Signed)
Pt here for follow up, reports increased weakness.
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
## 2021-03-02 ENCOUNTER — Inpatient Hospital Stay: Payer: Medicare HMO

## 2021-03-02 DIAGNOSIS — C7A8 Other malignant neuroendocrine tumors: Secondary | ICD-10-CM | POA: Diagnosis not present

## 2021-03-02 DIAGNOSIS — R739 Hyperglycemia, unspecified: Secondary | ICD-10-CM | POA: Diagnosis not present

## 2021-03-02 DIAGNOSIS — R7303 Prediabetes: Secondary | ICD-10-CM | POA: Diagnosis not present

## 2021-03-02 LAB — T4: T4, Total: 6.9 ug/dL (ref 4.5–12.0)

## 2021-03-03 ENCOUNTER — Inpatient Hospital Stay: Payer: Medicare HMO

## 2021-03-03 NOTE — Progress Notes (Signed)
Nutrition  Patient was a no show for nutrition appointment today.  RD will plan to check in on patient during infusion on Thursday, May 5th during scheduled infusion.  Burrel Legrand B. Zenia Resides, [place], [place] Registered Dietitian [phone] (mobile)

## 2021-03-06 ENCOUNTER — Inpatient Hospital Stay: Payer: Medicare HMO

## 2021-03-07 ENCOUNTER — Ambulatory Visit: Payer: Medicare HMO | Admitting: Oncology

## 2021-03-07 ENCOUNTER — Other Ambulatory Visit: Payer: Medicare HMO

## 2021-03-08 ENCOUNTER — Other Ambulatory Visit: Payer: Self-pay | Admitting: Oncology

## 2021-03-08 ENCOUNTER — Other Ambulatory Visit: Payer: Self-pay

## 2021-03-08 ENCOUNTER — Inpatient Hospital Stay: Payer: Medicare HMO

## 2021-03-08 ENCOUNTER — Telehealth: Payer: Self-pay

## 2021-03-08 ENCOUNTER — Inpatient Hospital Stay (HOSPITAL_BASED_OUTPATIENT_CLINIC_OR_DEPARTMENT_OTHER): Payer: Medicare HMO | Admitting: Oncology

## 2021-03-08 ENCOUNTER — Inpatient Hospital Stay (HOSPITAL_BASED_OUTPATIENT_CLINIC_OR_DEPARTMENT_OTHER): Payer: Medicare HMO | Admitting: Hospice and Palliative Medicine

## 2021-03-08 ENCOUNTER — Encounter: Payer: Self-pay | Admitting: Oncology

## 2021-03-08 ENCOUNTER — Inpatient Hospital Stay: Payer: Medicare HMO | Attending: Oncology

## 2021-03-08 VITALS — BP 94/66 | HR 71 | Temp 97.6°F | Resp 20 | Wt 139.8 lb

## 2021-03-08 DIAGNOSIS — Z7952 Long term (current) use of systemic steroids: Secondary | ICD-10-CM | POA: Insufficient documentation

## 2021-03-08 DIAGNOSIS — R739 Hyperglycemia, unspecified: Secondary | ICD-10-CM | POA: Insufficient documentation

## 2021-03-08 DIAGNOSIS — Z923 Personal history of irradiation: Secondary | ICD-10-CM | POA: Diagnosis not present

## 2021-03-08 DIAGNOSIS — E861 Hypovolemia: Secondary | ICD-10-CM | POA: Diagnosis not present

## 2021-03-08 DIAGNOSIS — Z5189 Encounter for other specified aftercare: Secondary | ICD-10-CM | POA: Diagnosis not present

## 2021-03-08 DIAGNOSIS — Z87891 Personal history of nicotine dependence: Secondary | ICD-10-CM | POA: Insufficient documentation

## 2021-03-08 DIAGNOSIS — Z5111 Encounter for antineoplastic chemotherapy: Secondary | ICD-10-CM | POA: Insufficient documentation

## 2021-03-08 DIAGNOSIS — Z515 Encounter for palliative care: Secondary | ICD-10-CM | POA: Diagnosis not present

## 2021-03-08 DIAGNOSIS — E46 Unspecified protein-calorie malnutrition: Secondary | ICD-10-CM | POA: Insufficient documentation

## 2021-03-08 DIAGNOSIS — C7A1 Malignant poorly differentiated neuroendocrine tumors: Secondary | ICD-10-CM | POA: Insufficient documentation

## 2021-03-08 DIAGNOSIS — R197 Diarrhea, unspecified: Secondary | ICD-10-CM

## 2021-03-08 DIAGNOSIS — Z79899 Other long term (current) drug therapy: Secondary | ICD-10-CM | POA: Diagnosis not present

## 2021-03-08 DIAGNOSIS — K5900 Constipation, unspecified: Secondary | ICD-10-CM | POA: Insufficient documentation

## 2021-03-08 DIAGNOSIS — Z8616 Personal history of COVID-19: Secondary | ICD-10-CM | POA: Diagnosis not present

## 2021-03-08 DIAGNOSIS — C7802 Secondary malignant neoplasm of left lung: Secondary | ICD-10-CM

## 2021-03-08 DIAGNOSIS — Z9049 Acquired absence of other specified parts of digestive tract: Secondary | ICD-10-CM | POA: Diagnosis not present

## 2021-03-08 DIAGNOSIS — Z809 Family history of malignant neoplasm, unspecified: Secondary | ICD-10-CM | POA: Diagnosis not present

## 2021-03-08 DIAGNOSIS — Z5112 Encounter for antineoplastic immunotherapy: Secondary | ICD-10-CM | POA: Insufficient documentation

## 2021-03-08 DIAGNOSIS — R5383 Other fatigue: Secondary | ICD-10-CM

## 2021-03-08 DIAGNOSIS — Z801 Family history of malignant neoplasm of trachea, bronchus and lung: Secondary | ICD-10-CM | POA: Insufficient documentation

## 2021-03-08 DIAGNOSIS — C7931 Secondary malignant neoplasm of brain: Secondary | ICD-10-CM | POA: Diagnosis not present

## 2021-03-08 DIAGNOSIS — C7B8 Other secondary neuroendocrine tumors: Secondary | ICD-10-CM | POA: Insufficient documentation

## 2021-03-08 LAB — CBC WITH DIFFERENTIAL/PLATELET
Abs Immature Granulocytes: 0.38 10*3/uL — ABNORMAL HIGH (ref 0.00–0.07)
Basophils Absolute: 0 10*3/uL (ref 0.0–0.1)
Basophils Relative: 0 %
Eosinophils Absolute: 0 10*3/uL (ref 0.0–0.5)
Eosinophils Relative: 0 %
HCT: 38.6 % — ABNORMAL LOW (ref 39.0–52.0)
Hemoglobin: 14.3 g/dL (ref 13.0–17.0)
Immature Granulocytes: 5 %
Lymphocytes Relative: 19 %
Lymphs Abs: 1.5 10*3/uL (ref 0.7–4.0)
MCH: 32.3 pg (ref 26.0–34.0)
MCHC: 37 g/dL — ABNORMAL HIGH (ref 30.0–36.0)
MCV: 87.1 fL (ref 80.0–100.0)
Monocytes Absolute: 0.4 10*3/uL (ref 0.1–1.0)
Monocytes Relative: 5 %
Neutro Abs: 5.7 10*3/uL (ref 1.7–7.7)
Neutrophils Relative %: 71 %
Platelets: 90 10*3/uL — ABNORMAL LOW (ref 150–400)
RBC: 4.43 MIL/uL (ref 4.22–5.81)
RDW: 12.4 % (ref 11.5–15.5)
WBC: 8.1 10*3/uL (ref 4.0–10.5)
nRBC: 0.7 % — ABNORMAL HIGH (ref 0.0–0.2)

## 2021-03-08 LAB — COMPREHENSIVE METABOLIC PANEL
ALT: 30 U/L (ref 0–44)
AST: 19 U/L (ref 15–41)
Albumin: 3.3 g/dL — ABNORMAL LOW (ref 3.5–5.0)
Alkaline Phosphatase: 38 U/L (ref 38–126)
Anion gap: 13 (ref 5–15)
BUN: 31 mg/dL — ABNORMAL HIGH (ref 8–23)
CO2: 24 mmol/L (ref 22–32)
Calcium: 8.6 mg/dL — ABNORMAL LOW (ref 8.9–10.3)
Chloride: 99 mmol/L (ref 98–111)
Creatinine, Ser: 0.6 mg/dL — ABNORMAL LOW (ref 0.61–1.24)
GFR, Estimated: >60 mL/min (ref >60)
Glucose, Bld: 234 mg/dL — ABNORMAL HIGH (ref 70–99)
Potassium: 3.6 mmol/L (ref 3.5–5.1)
Sodium: 136 mmol/L (ref 135–145)
Total Bilirubin: 1.6 mg/dL — ABNORMAL HIGH (ref 0.3–1.2)
Total Protein: 5.8 g/dL — ABNORMAL LOW (ref 6.5–8.1)

## 2021-03-08 LAB — BILIRUBIN, DIRECT: Bilirubin, Direct: 0.3 mg/dL — ABNORMAL HIGH (ref 0.0–0.2)

## 2021-03-08 LAB — CORTISOL: Cortisol, Plasma: 2.4 ug/dL

## 2021-03-08 MED ORDER — HEPARIN SOD (PORK) LOCK FLUSH 100 UNIT/ML IV SOLN
INTRAVENOUS | Status: AC
  Filled 2021-03-08: qty 5

## 2021-03-08 MED ORDER — SODIUM CHLORIDE 0.9 % IV SOLN
Freq: Once | INTRAVENOUS | Status: AC
  Filled 2021-03-08: qty 250

## 2021-03-08 MED ORDER — HEPARIN SOD (PORK) LOCK FLUSH 100 UNIT/ML IV SOLN
500.0000 [IU] | Freq: Once | INTRAVENOUS | Status: AC
  Administered 2021-03-08: 500 [IU] via INTRAVENOUS
  Filled 2021-03-08: qty 5

## 2021-03-08 MED ORDER — SODIUM CHLORIDE 0.9% FLUSH
10.0000 mL | Freq: Once | INTRAVENOUS | Status: AC
  Administered 2021-03-08: 10 mL via INTRAVENOUS
  Filled 2021-03-08: qty 10
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
## 2021-03-08 NOTE — Progress Notes (Signed)
Patient dose not have an appetite and has a 4 lb wt loss since last documented wt.  Also feeling an increase in weakness.

## 2021-03-08 NOTE — Progress Notes (Signed)
Nutrition Assessment   Reason for Assessment:  Poor appetite, weight loss.    ASSESSMENT:  Acute add-on to schedule today as not going to get treatment tomorrow.    75 year old male with metastatic lung high grade neuroendocrine carcinoma.  Patient completed brain radiation on 4/22 Past medical history of CAD, HTN, MI, GERD. On palliative chemotherapy with carboplatin and etoposide.    Met with patient during infusion of fluids today.  Patient says that appetite is poor.  Does not want to eat because he "messes" himself and does not want to clean self and/or does not have help to clean self. Says that he has been eating [employer] chicken noodle soup and drinking chocolate or regular milk. Says ensure/boost shakes makes him "run off".  Hard to get information out of patient.  Sounds like wife is busy taking care of sick mother.  Asked patient if RD could speak with wife and patient agreed.  RD unable to locate wife in building.    RD called wife this pm.  Wife reports that her mother is at the hospice home and she has been going there daily spending time with her.  She is tearful on the phone with RD as she is concerned about patient as well.  She confirms patient's appetite is poor.  She says that he is having diarrhea after he eats.  Can't tell RD how many times per day patient has diarrhea.  Patient asleep at the time of RD call.  Wife unclear when diarrhea started.  When asked if diarrhea was worse after starting metformin and wife says "yes".  Wife states that patient does not want her to help him get cleaned up.    Palliative care NP met with patient today and noted patient unsure about home health. Planning consult for home-based palliative care    Medications: decadron, metformin, prilosec, zofran, compazine  Labs: glucose 234   Anthropometrics:   Height: 67 inches Weight: 139 lb 12.8 oz UBW: 149 lb 14.2 oz on 4/20 167 lb on 4/6 172 lb on 3/24 BMI: 21  19% weight loss in  the last 1 month and half, significant   Estimated Energy Needs  Kcals: 1800-2200 Protein: 90-110 g Fluid: 1.8 L   NUTRITION DIAGNOSIS: Inadequate oral intake related to diarrhea, deconditioning, poor appetite, cancer as evidenced by 19% weight loss in the last month and half and eating < or equal to 50% of meals for > or equal to 5 days.  Patient meets criteria for severe malnutrition in context of acute illness as evidenced by 19% weight loss in 1 month and half and eating less than 50% of estimated needs for > or equal to 5 days.  INTERVENTION:  Message sent to provider requesting treatment of diarrhea.  Noted referral for home based palliative care to determine assistance in the home.  Wife appreciative of call and encouraged RD to call at anytime.      MONITORING, EVALUATION, GOAL: weight trends, intake   Next Visit: Wednesday, May 11 during infusion  Jakie Debow B. Zenia Resides, [place], [place] Registered Dietitian [phone] (mobile)

## 2021-03-08 NOTE — Addendum Note (Signed)
Addended by: Altha Harm R on: 03/08/2021 12:19 PM   Modules accepted: Orders

## 2021-03-08 NOTE — Patient Instructions (Signed)
Dehydration, Adult Dehydration is a condition in which there is not enough water or other fluids in the body. This happens when a person loses more fluids than he or she takes in. Important organs, such as the kidneys, brain, and heart, cannot function without a proper amount of fluids. Any loss of fluids from the body can lead to dehydration. Dehydration can be mild, moderate, or severe. It should be treated right away to prevent it from becoming severe. What are the causes? Dehydration may be caused by:  Conditions that cause loss of water or other fluids, such as diarrhea, vomiting, or sweating or urinating a lot.  Not drinking enough fluids, especially when you are ill or doing activities that require a lot of energy.  Other illnesses and conditions, such as fever or infection.  Certain medicines, such as medicines that remove excess fluid from the body (diuretics).  Lack of safe drinking water.  Not being able to get enough water and food. What increases the risk? The following factors may make you more likely to develop this condition:  Having a long-term (chronic) illness that has not been treated properly, such as diabetes, heart disease, or kidney disease.  Being 65 years of age or older.  Having a disability.  Living in a place that is high in altitude, where thinner, drier air causes more fluid loss.  Doing exercises that put stress on your body for a long time (endurance sports). What are the signs or symptoms? Symptoms of dehydration depend on how severe it is. Mild or moderate dehydration  Thirst.  Dry lips or dry mouth.  Dizziness or light-headedness, especially when standing up from a seated position.  Muscle cramps.  Dark urine. Urine may be the color of tea.  Less urine or tears produced than usual.  Headache. Severe dehydration  Changes in skin. Your skin may be cold and clammy, blotchy, or pale. Your skin also may not return to normal after being  lightly pinched and released.  Little or no tears, urine, or sweat.  Changes in vital signs, such as rapid breathing and low blood pressure. Your pulse may be weak or may be faster than 100 beats a minute when you are sitting still.  Other changes, such as: ? Feeling very thirsty. ? Sunken eyes. ? Cold hands and feet. ? Confusion. ? Being very tired (lethargic) or having trouble waking from sleep. ? Short-term weight loss. ? Loss of consciousness. How is this diagnosed? This condition is diagnosed based on your symptoms and a physical exam. You may have blood and urine tests to help confirm the diagnosis. How is this treated? Treatment for this condition depends on how severe it is. Treatment should be started right away. Do not wait until dehydration becomes severe. Severe dehydration is an emergency and needs to be treated in a hospital.  Mild or moderate dehydration can be treated at home. You may be asked to: ? Drink more fluids. ? Drink an oral rehydration solution (ORS). This drink helps restore proper amounts of fluids and salts and minerals in the blood (electrolytes).  Severe dehydration can be treated: ? With IV fluids. ? By correcting abnormal levels of electrolytes. This is often done by giving electrolytes through a tube that is passed through your nose and into your stomach (nasogastric tube, or NG tube). ? By treating the underlying cause of dehydration. Follow these instructions at home: Oral rehydration solution If told by your health care provider, drink an ORS:  Make   an ORS by following instructions on the package.  Start by drinking small amounts, about  cup (120 mL) every 5-10 minutes.  Slowly increase how much you drink until you have taken the amount recommended by your health care provider. Eating and drinking  Drink enough clear fluid to keep your urine pale yellow. If you were told to drink an ORS, finish the ORS first and then start slowly drinking  other clear fluids. Drink fluids such as: ? Water. Do not drink only water. Doing that can lead to hyponatremia, which is having too little salt (sodium) in the body. ? Water from ice chips you suck on. ? Fruit juice that you have added water to (diluted fruit juice). ? Low-calorie sports drinks.  Eat foods that contain a healthy balance of electrolytes, such as bananas, oranges, potatoes, tomatoes, and spinach.  Do not drink alcohol.  Avoid the following: ? Drinks that contain a lot of sugar. These include high-calorie sports drinks, fruit juice that is not diluted, and soda. ? Caffeine. ? Foods that are greasy or contain a lot of fat or sugar.         General instructions  Take over-the-counter and prescription medicines only as told by your health care provider.  Do not take sodium tablets. Doing that can lead to having too much sodium in the body (hypernatremia).  Return to your normal activities as told by your health care provider. Ask your health care provider what activities are safe for you.  Keep all follow-up visits as told by your health care provider. This is important. Contact a health care provider if:  You have muscle cramps, pain, or discomfort, such as: ? Pain in your abdomen and the pain gets worse or stays in one area (localizes). ? Stiff neck.  You have a rash.  You are more irritable than usual.  You are sleepier or have a harder time waking than usual.  You feel weak or dizzy.  You feel very thirsty. Get help right away if you have:  Any symptoms of severe dehydration.  Symptoms of vomiting, such as: ? You cannot eat or drink without vomiting. ? Vomiting gets worse or does not go away. ? Vomit includes blood or green matter (bile).  Symptoms that get worse with treatment.  A fever.  A severe headache.  Problems with urination or bowel movements, such as: ? Diarrhea that gets worse or does not go away. ? Blood in your stool (feces).  This may cause stool to look black and tarry. ? Not urinating, or urinating only a small amount of very dark urine, within 6-8 hours.  Trouble breathing. These symptoms may represent a serious problem that is an emergency. Do not wait to see if the symptoms will go away. Get medical help right away. Call your local emergency services (911 in the [place] Do not drive yourself to the hospital. Summary  Dehydration is a condition in which there is not enough water or other fluids in the body. This happens when a person loses more fluids than he or she takes in.  Treatment for this condition depends on how severe it is. Treatment should be started right away. Do not wait until dehydration becomes severe.  Drink enough clear fluid to keep your urine pale yellow. If you were told to drink an oral rehydration solution (ORS), finish the ORS first and then start slowly drinking other clear fluids.  Take over-the-counter and prescription medicines only as told by your health   care provider.  Get help right away if you have any symptoms of severe dehydration. This information is not intended to replace advice given to you by your health care provider. Make sure you discuss any questions you have with your health care provider. Document Revised: 06/04/2019 Document Reviewed: 06/04/2019 Elsevier Patient Education  2021 Elsevier Inc.  
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
## 2021-03-08 NOTE — Telephone Encounter (Signed)
-----   Message from Evelina Dun, RN sent at 03/08/2021  3:25 PM EDT -----  ----- Message ----- From: Earlie Server, MD Sent: 03/08/2021   3:01 PM EDT To: Irean Hong, NP, Evelina Dun, RN, #  It is very difficult to get any history from them. Patient is a poor historianand wife is tearful. Fatigue was the main complaints.  No mention of diarrhea at all this morning. Please arrange him to check C diff.  If negative , we can start patient on imodium. Vonna Kotyk, can you see them in person or virtually? Thanks.   ----- Message ----- From: Jennet Maduro, [place] Sent: 03/08/2021   1:37 PM EDT To: Kirt Boys Borders, NP, Evelina Dun, RN, #  I spoke with patient during infusion this am.  He says that he does not want to eat because he "messes himself" and does not want to clean himself and/or has trouble cleaning himself.  Sounds like wife is taking care of sick mother and not at home much.  Hard to get clear information out of patient today.  I asked his permission to speak with wife and he agreed. Unable to locate her in waiting room.  Called her this pm. Clarified that she and patient live together but she is going to the hospice home daily where her mother is currently.  She says that patient is having diarrhea after eating. Appetite is poor. Can't tell me how often he has diarrhea.  I asked wife to speak with husband via phone and she said he was sleeping.  When asked if diarrhea is worse after starting metformin (prescribed on 4/28 by PCP) she said "yes".  Wife says patient does not want her to help him.  Can we prescribe/recommend medication for diarrhea for patient to help his symptoms??  Does he need stool studies??    Thanks, [employer]

## 2021-03-08 NOTE — Telephone Encounter (Signed)
This message was received after patients appointments with Dr. Enis Slipper, and [facility] today.   Left message for patient's wife to return call to discuss with her about Dr. Tasia Catchings wanting to obtain a stool sample.

## 2021-03-09 ENCOUNTER — Other Ambulatory Visit: Payer: Self-pay

## 2021-03-09 ENCOUNTER — Inpatient Hospital Stay: Payer: Medicare HMO

## 2021-03-09 NOTE — Telephone Encounter (Signed)
Patient's wife informed and she will discuss with patient about obtaining stools sample.  Lab supplies will be obtained by [facility] and given to patient.

## 2021-03-10 ENCOUNTER — Inpatient Hospital Stay: Payer: Medicare HMO

## 2021-03-10 ENCOUNTER — Other Ambulatory Visit: Payer: Self-pay

## 2021-03-10 ENCOUNTER — Telehealth: Payer: Self-pay | Admitting: Adult Health Nurse Practitioner

## 2021-03-10 NOTE — Telephone Encounter (Signed)
Spoke with patient's wife, Enid Derry, and explained to her that I was calling from [place] and that we had rec'd a referral for patient from the [place], and she said that he mother just passed away last night, at the [facility] in [place] and she is trying to take care of all the arrangements so I told her that I would call her back next week and we could schedule a visit at that time and she was in agreement with this.

## 2021-03-10 NOTE — Progress Notes (Signed)
Error

## 2021-03-13 ENCOUNTER — Ambulatory Visit: Payer: Medicare HMO

## 2021-03-15 ENCOUNTER — Inpatient Hospital Stay: Payer: Medicare HMO | Admitting: Occupational Therapy

## 2021-03-15 ENCOUNTER — Inpatient Hospital Stay: Payer: Medicare HMO

## 2021-03-15 ENCOUNTER — Inpatient Hospital Stay (HOSPITAL_BASED_OUTPATIENT_CLINIC_OR_DEPARTMENT_OTHER): Payer: Medicare HMO | Admitting: Oncology

## 2021-03-15 ENCOUNTER — Encounter: Payer: Self-pay | Admitting: Oncology

## 2021-03-15 ENCOUNTER — Ambulatory Visit: Payer: Medicare HMO | Admitting: Oncology

## 2021-03-15 ENCOUNTER — Other Ambulatory Visit: Payer: Self-pay | Admitting: Hospice and Palliative Medicine

## 2021-03-15 ENCOUNTER — Ambulatory Visit: Payer: Medicare HMO

## 2021-03-15 ENCOUNTER — Inpatient Hospital Stay: Payer: Medicare HMO | Admitting: Hospice and Palliative Medicine

## 2021-03-15 ENCOUNTER — Other Ambulatory Visit: Payer: Medicare HMO

## 2021-03-15 ENCOUNTER — Telehealth: Payer: Self-pay

## 2021-03-15 VITALS — BP 90/63 | HR 65 | Temp 95.1°F | Resp 17 | Wt 137.0 lb

## 2021-03-15 VITALS — BP 95/56 | HR 57 | Resp 20

## 2021-03-15 DIAGNOSIS — Z5111 Encounter for antineoplastic chemotherapy: Secondary | ICD-10-CM

## 2021-03-15 DIAGNOSIS — C7802 Secondary malignant neoplasm of left lung: Secondary | ICD-10-CM

## 2021-03-15 DIAGNOSIS — R739 Hyperglycemia, unspecified: Secondary | ICD-10-CM | POA: Diagnosis not present

## 2021-03-15 DIAGNOSIS — I9589 Other hypotension: Secondary | ICD-10-CM | POA: Diagnosis not present

## 2021-03-15 DIAGNOSIS — R5383 Other fatigue: Secondary | ICD-10-CM

## 2021-03-15 DIAGNOSIS — Z5189 Encounter for other specified aftercare: Secondary | ICD-10-CM | POA: Diagnosis not present

## 2021-03-15 DIAGNOSIS — C7A1 Malignant poorly differentiated neuroendocrine tumors: Secondary | ICD-10-CM | POA: Diagnosis not present

## 2021-03-15 DIAGNOSIS — C7B8 Other secondary neuroendocrine tumors: Secondary | ICD-10-CM | POA: Diagnosis not present

## 2021-03-15 DIAGNOSIS — K5900 Constipation, unspecified: Secondary | ICD-10-CM | POA: Diagnosis not present

## 2021-03-15 DIAGNOSIS — E861 Hypovolemia: Secondary | ICD-10-CM

## 2021-03-15 DIAGNOSIS — C7931 Secondary malignant neoplasm of brain: Secondary | ICD-10-CM | POA: Diagnosis not present

## 2021-03-15 DIAGNOSIS — Z5112 Encounter for antineoplastic immunotherapy: Secondary | ICD-10-CM | POA: Diagnosis not present

## 2021-03-15 LAB — CBC WITH DIFFERENTIAL/PLATELET
Abs Immature Granulocytes: 0.19 10*3/uL — ABNORMAL HIGH (ref 0.00–0.07)
Basophils Absolute: 0 10*3/uL (ref 0.0–0.1)
Basophils Relative: 0 %
Eosinophils Absolute: 0 10*3/uL (ref 0.0–0.5)
Eosinophils Relative: 1 %
HCT: 31.8 % — ABNORMAL LOW (ref 39.0–52.0)
Hemoglobin: 11.6 g/dL — ABNORMAL LOW (ref 13.0–17.0)
Immature Granulocytes: 4 %
Lymphocytes Relative: 19 %
Lymphs Abs: 0.9 10*3/uL (ref 0.7–4.0)
MCH: 32 pg (ref 26.0–34.0)
MCHC: 36.5 g/dL — ABNORMAL HIGH (ref 30.0–36.0)
MCV: 87.8 fL (ref 80.0–100.0)
Monocytes Absolute: 0.2 10*3/uL (ref 0.1–1.0)
Monocytes Relative: 5 %
Neutro Abs: 3.1 10*3/uL (ref 1.7–7.7)
Neutrophils Relative %: 71 %
Platelets: 100 10*3/uL — ABNORMAL LOW (ref 150–400)
RBC: 3.62 MIL/uL — ABNORMAL LOW (ref 4.22–5.81)
RDW: 13.5 % (ref 11.5–15.5)
WBC: 4.4 10*3/uL (ref 4.0–10.5)
nRBC: 0.9 % — ABNORMAL HIGH (ref 0.0–0.2)

## 2021-03-15 LAB — COMPREHENSIVE METABOLIC PANEL
ALT: 23 U/L (ref 0–44)
AST: 19 U/L (ref 15–41)
Albumin: 2.9 g/dL — ABNORMAL LOW (ref 3.5–5.0)
Alkaline Phosphatase: 33 U/L — ABNORMAL LOW (ref 38–126)
Anion gap: 10 (ref 5–15)
BUN: 23 mg/dL (ref 8–23)
CO2: 26 mmol/L (ref 22–32)
Calcium: 8.2 mg/dL — ABNORMAL LOW (ref 8.9–10.3)
Chloride: 100 mmol/L (ref 98–111)
Creatinine, Ser: 0.51 mg/dL — ABNORMAL LOW (ref 0.61–1.24)
GFR, Estimated: >60 mL/min (ref >60)
Glucose, Bld: 209 mg/dL — ABNORMAL HIGH (ref 70–99)
Potassium: 3.1 mmol/L — ABNORMAL LOW (ref 3.5–5.1)
Sodium: 136 mmol/L (ref 135–145)
Total Bilirubin: 1.5 mg/dL — ABNORMAL HIGH (ref 0.3–1.2)
Total Protein: 5 g/dL — ABNORMAL LOW (ref 6.5–8.1)

## 2021-03-15 LAB — TSH: TSH: 1.662 u[IU]/mL (ref 0.350–4.500)

## 2021-03-15 MED ORDER — SODIUM CHLORIDE 0.9 % IV SOLN
100.0000 mg/m2 | Freq: Once | INTRAVENOUS | Status: AC
  Administered 2021-03-15: 170 mg via INTRAVENOUS
  Filled 2021-03-15: qty 8.5

## 2021-03-15 MED ORDER — SODIUM CHLORIDE 0.9% FLUSH
10.0000 mL | INTRAVENOUS | Status: DC | PRN
Start: 2021-03-15 — End: 2021-03-15
  Filled 2021-03-15: qty 10

## 2021-03-15 MED ORDER — SODIUM CHLORIDE 0.9% FLUSH
10.0000 mL | Freq: Once | INTRAVENOUS | Status: AC
  Administered 2021-03-15: 10 mL via INTRAVENOUS
  Filled 2021-03-15: qty 10

## 2021-03-15 MED ORDER — SODIUM CHLORIDE 0.9 % IV SOLN
150.0000 mg | Freq: Once | INTRAVENOUS | Status: AC
  Administered 2021-03-15: 150 mg via INTRAVENOUS
  Filled 2021-03-15: qty 150

## 2021-03-15 MED ORDER — HEPARIN SOD (PORK) LOCK FLUSH 100 UNIT/ML IV SOLN
500.0000 [IU] | Freq: Once | INTRAVENOUS | Status: AC
  Administered 2021-03-15: 500 [IU] via INTRAVENOUS
  Filled 2021-03-15: qty 5

## 2021-03-15 MED ORDER — POTASSIUM CHLORIDE IN NACL 20-0.9 MEQ/L-% IV SOLN
Freq: Once | INTRAVENOUS | Status: AC
  Filled 2021-03-15: qty 1000

## 2021-03-15 MED ORDER — SODIUM CHLORIDE 0.9 % IV SOLN
Freq: Once | INTRAVENOUS | Status: AC
  Filled 2021-03-15: qty 250

## 2021-03-15 MED ORDER — PALONOSETRON HCL INJECTION 0.25 MG/5ML
0.2500 mg | Freq: Once | INTRAVENOUS | Status: AC
  Administered 2021-03-15: 0.25 mg via INTRAVENOUS
  Filled 2021-03-15: qty 5

## 2021-03-15 MED ORDER — SODIUM CHLORIDE 0.9 % IV SOLN
1200.0000 mg | Freq: Once | INTRAVENOUS | Status: AC
  Administered 2021-03-15: 1200 mg via INTRAVENOUS
  Filled 2021-03-15: qty 20

## 2021-03-15 MED ORDER — HEPARIN SOD (PORK) LOCK FLUSH 100 UNIT/ML IV SOLN
INTRAVENOUS | Status: AC
  Filled 2021-03-15: qty 5

## 2021-03-15 MED ORDER — DEXAMETHASONE 4 MG PO TABS: 4.0000 mg | ORAL_TABLET | Freq: Every day | ORAL | 0 refills | Status: DC

## 2021-03-15 MED ORDER — SODIUM CHLORIDE 0.9 % IV SOLN: 434.7000 mg | Freq: Once | INTRAVENOUS | Status: DC

## 2021-03-15 MED ORDER — SODIUM CHLORIDE 0.9 % IV SOLN
10.0000 mg | Freq: Once | INTRAVENOUS | Status: AC
  Administered 2021-03-15: 10 mg via INTRAVENOUS
  Filled 2021-03-15: qty 10

## 2021-03-15 MED ORDER — SODIUM CHLORIDE 0.9 % IV SOLN
372.6000 mg | Freq: Once | INTRAVENOUS | Status: AC
  Administered 2021-03-15: 370 mg via INTRAVENOUS
  Filled 2021-03-15: qty 37

## 2021-03-15 NOTE — Telephone Encounter (Signed)
Message received by [name], OT:  Pt show decrease endurance, increase fatigue. Risk for fall high - on BERG balance screen pt 32/56 Pt walking with wide base and use hands to push up from chair and sit down. Cannot stand narrow base - and unsteady turning around. WOULD RECOMMEND HH THERAPY WITH PALLIATIVE CARE AT [place] enter referral for palliative care and request an order for [facility].  Order for [facility] has been entered.

## 2021-03-15 NOTE — Progress Notes (Signed)
Per Dr Tasia Catchings, okay to run 20 mEq of potassium through port over one hour

## 2021-03-15 NOTE — Progress Notes (Signed)
Patient here for oncology follow-up appointment, expresses concerns of low BP, fatigue and constipation

## 2021-03-15 NOTE — Progress Notes (Signed)
Nutrition Follow-up:  Patient with metastatic lung high grade neuroendocrine carcinoma.  Patient completed brain radiation on 4/22 Patient receiving palliative chemotherapy.  Met with patient today in infusion.  Patient now with constipation.  Still says that he does not want to eat much as will have to get up and use the bathroom.  He has no strength.  Says that he wants to improve his strength.  Drinking beef broth during visit.  Says that he continues to mainly eat chicken soup from [employer].      Medications: reviewed  Labs: reviewed  Anthropometrics:   Weight 137 lb today  139 lb on 12.8 oz UBW of 149 lb on 4/20   NUTRITION DIAGNOSIS: Inadequate oral intake continues    MALNUTRITION DIAGNOSIS: continues with severe malnutrition in context of acute illness   INTERVENTION:  Discussed foods that contain protein and importance of consuming foods high in protein to help him improve strength. Patient agreeable to adding eggs in diet over the next week.   Discussed importance of doing exercises from OT along with eating more to help improve strength.   Encouraged him to increase fluids to help with constipation along with foods high in fiber.  Said he talked to MD about constipation today.     MONITORING, EVALUATION, GOAL: Weight trends, intake   NEXT VISIT: Wednesday, May 18 during infusion  Anaiyah Anglemyer B. Zenia Resides, [place], [place] Registered Dietitian [phone] (mobile)
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
## 2021-03-15 NOTE — Progress Notes (Signed)
Referral home palliative care

## 2021-03-15 NOTE — Patient Instructions (Addendum)
Dehydration, Adult Dehydration is condition in which there is not enough water or other fluids in the body. This happens when a person loses more fluids than he or she takes in. Important body parts cannot work right without the right amount of fluids. Any loss of fluids from the body can cause dehydration. Dehydration can be mild, worse, or very bad. It should be treated right away to keep it from getting very bad. What are the causes? This condition may be caused by:  Conditions that cause loss of water or other fluids, such as: ? Watery poop (diarrhea). ? Vomiting. ? Sweating a lot. ? Peeing (urinating) a lot.  Not drinking enough fluids, especially when you: ? Are ill. ? Are doing things that take a lot of energy to do.  Other illnesses and conditions, such as fever or infection.  Certain medicines, such as medicines that take extra fluid out of the body (diuretics).  Lack of safe drinking water.  Not being able to get enough water and food. What increases the risk? The following factors may make you more likely to develop this condition:  Having a long-term (chronic) illness that has not been treated the right way, such as: ? Diabetes. ? Heart disease. ? Kidney disease.  Being 26 years of age or older.  Having a disability.  Living in a place that is high above the ground or sea (high in altitude). The thinner, dried air causes more fluid loss.  Doing exercises that put stress on your body for a long time. What are the signs or symptoms? Symptoms of dehydration depend on how bad it is. Mild or worse dehydration  Thirst.  Dry lips or dry mouth.  Feeling dizzy or light-headed, especially when you stand up from sitting.  Muscle cramps.  Your body making: ? Dark pee (urine). Pee may be the color of tea. ? Less pee than normal. ? Less tears than normal.  Headache. Very bad dehydration  Changes in skin. Skin may: ? Be cold to the touch (clammy). ? Be blotchy  or pale. ? Not go back to normal right after you lightly pinch it and let it go.  Little or no tears, pee, or sweat.  Changes in vital signs, such as: ? Fast breathing. ? Low blood pressure. ? Weak pulse. ? Pulse that is more than 100 beats a minute when you are sitting still.  Other changes, such as: ? Feeling very thirsty. ? Eyes that look hollow (sunken). ? Cold hands and feet. ? Being mixed up (confused). ? Being very tired (lethargic) or having trouble waking from sleep. ? Short-term weight loss. ? Loss of consciousness. How is this treated? Treatment for this condition depends on how bad it is. Treatment should start right away. Do not wait until your condition gets very bad. Very bad dehydration is an emergency. You will need to go to a hospital.  Mild or worse dehydration can be treated at home. You may be asked to: ? Drink more fluids. ? Drink an oral rehydration solution (ORS). This drink helps get the right amounts of fluids and salts and minerals in the blood (electrolytes).  Very bad dehydration can be treated: ? With fluids through an IV tube. ? By getting normal levels of salts and minerals in your blood. This is often done by giving salts and minerals through a tube. The tube is passed through your nose and into your stomach. ? By treating the root cause. Follow these instructions at  home: Oral rehydration solution If told by your doctor, drink an ORS:  Make an ORS. Use instructions on the package.  Start by drinking small amounts, about  cup (120 mL) every 5-10 minutes.  Slowly drink more until you have had the amount that your doctor said to have. Eating and drinking  Drink enough clear fluid to keep your pee pale yellow. If you were told to drink an ORS, finish the ORS first. Then, start slowly drinking other clear fluids. Drink fluids such as: ? Water. Do not drink only water. Doing that can make the salt (sodium) level in your body get too low. ? Water  from ice chips you suck on. ? Fruit juice that you have added water to (diluted). ? Low-calorie sports drinks.  Eat foods that have the right amounts of salts and minerals, such as: ? Bananas. ? Oranges. ? Potatoes. ? Tomatoes. ? Spinach.  Do not drink alcohol.  Avoid: ? Drinks that have a lot of sugar. These include:  High-calorie sports drinks.  Fruit juice that you did not add water to.  Soda.  Caffeine. ? Foods that are greasy or have a lot of fat or sugar.         General instructions  Take over-the-counter and prescription medicines only as told by your doctor.  Do not take salt tablets. Doing that can make the salt level in your body get too high.  Return to your normal activities as told by your doctor. Ask your doctor what activities are safe for you.  Keep all follow-up visits as told by your doctor. This is important. Contact a doctor if:  You have pain in your belly (abdomen) and the pain: ? Gets worse. ? Stays in one place.  You have a rash.  You have a stiff neck.  You get angry or annoyed (irritable) more easily than normal.  You are more tired or have a harder time waking than normal.  You feel: ? Weak or dizzy. ? Very thirsty. Get help right away if you have:  Any symptoms of very bad dehydration.  Symptoms of vomiting, such as: ? You cannot eat or drink without vomiting. ? Your vomiting gets worse or does not go away. ? Your vomit has blood or green stuff in it.  Symptoms that get worse with treatment.  A fever.  A very bad headache.  Problems with peeing or pooping (having a bowel movement), such as: ? Watery poop that gets worse or does not go away. ? Blood in your poop (stool). This may cause poop to look black and tarry. ? Not peeing in 6-8 hours. ? Peeing only a small amount of very dark pee in 6-8 hours.  Trouble breathing. These symptoms may be an emergency. Do not wait to see if the symptoms will go away. Get  medical help right away. Call your local emergency services (911 in the [place] Do not drive yourself to the hospital. Summary  Dehydration is a condition in which there is not enough water or other fluids in the body. This happens when a person loses more fluids than he or she takes in.  Treatment for this condition depends on how bad it is. Treatment should be started right away. Do not wait until your condition gets very bad.  Drink enough clear fluid to keep your pee pale yellow. If you were told to drink an oral rehydration solution (ORS), finish the ORS first. Then, start slowly drinking other clear fluids.  Take over-the-counter and prescription medicines only as told by your doctor.  Get help right away if you have any symptoms of very bad dehydration. This information is not intended to replace advice given to you by your health care provider. Make sure you discuss any questions you have with your health care provider. Document Revised: 06/04/2019 Document Reviewed: 06/04/2019 Elsevier Patient Education  2021 [place]. Hypokalemia Hypokalemia means that the amount of potassium in the blood is lower than normal. Potassium is a chemical (electrolyte) that helps regulate the amount of fluid in the body. It also stimulates muscle tightening (contraction) and helps nerves work properly. Normally, most of the body's potassium is inside cells, and only a very small amount is in the blood. Because the amount in the blood is so small, minor changes to potassium levels in the blood can be life-threatening. What are the causes? This condition may be caused by:  Antibiotic medicine.  Diarrhea or vomiting. Taking too much of a medicine that helps you have a bowel movement (laxative) can cause diarrhea and lead to hypokalemia.  Chronic kidney disease (CKD).  Medicines that help the body get rid of excess fluid (diuretics).  Eating disorders, such as bulimia.  Low magnesium levels in the  body.  Sweating a lot. What are the signs or symptoms? Symptoms of this condition include:  Weakness.  Constipation.  Fatigue.  Muscle cramps.  Mental confusion.  Skipped heartbeats or irregular heartbeat (palpitations).  Tingling or numbness. How is this diagnosed? This condition is diagnosed with a blood test. How is this treated? This condition may be treated by:  Taking potassium supplements by mouth.  Adjusting the medicines that you take.  Eating more foods that contain a lot of potassium. If your potassium level is very low, you may need to get potassium through an IV and be monitored in the hospital. Follow these instructions at home:  Take over-the-counter and prescription medicines only as told by your health care provider. This includes vitamins and supplements.  Eat a healthy diet. A healthy diet includes fresh fruits and vegetables, whole grains, healthy fats, and lean proteins.  If instructed, eat more foods that contain a lot of potassium. This includes: ? Nuts, such as peanuts and pistachios. ? Seeds, such as sunflower seeds and pumpkin seeds. ? Peas, lentils, and lima beans. ? Whole grain and bran cereals and breads. ? Fresh fruits and vegetables, such as apricots, avocado, bananas, cantaloupe, kiwi, oranges, tomatoes, asparagus, and potatoes. ? Orange juice. ? Tomato juice. ? Red meats. ? Yogurt.  Keep all follow-up visits as told by your health care provider. This is important.   Contact a health care provider if you:  Have weakness that gets worse.  Feel your heart pounding or racing.  Vomit.  Have diarrhea.  Have diabetes (diabetes mellitus) and you have trouble keeping your blood sugar (glucose) in your target range. Get help right away if you:  Have chest pain.  Have shortness of breath.  Have vomiting or diarrhea that lasts for more than 2 days.  Faint. Summary  Hypokalemia means that the amount of potassium in the blood is  lower than normal.  This condition is diagnosed with a blood test.  Hypokalemia may be treated by taking potassium supplements, adjusting the medicines that you take, or eating more foods that are high in potassium.  If your potassium level is very low, you may need to get potassium through an IV and be monitored in the hospital. This  information is not intended to replace advice given to you by your health care provider. Make sure you discuss any questions you have with your health care provider. Document Revised: 06/04/2018 Document Reviewed: 06/04/2018 Elsevier Patient Education  2021 [place]. Carboplatin injection What is this medicine? CARBOPLATIN (KAR boe pla tin) is a chemotherapy drug. It targets fast dividing cells, like cancer cells, and causes these cells to die. This medicine is used to treat ovarian cancer and many other cancers. This medicine may be used for other purposes; ask your health care provider or pharmacist if you have questions. COMMON BRAND NAME(S): Paraplatin What should I tell my health care provider before I take this medicine? They need to know if you have any of these conditions:  blood disorders  hearing problems  kidney disease  recent or ongoing radiation therapy  an unusual or allergic reaction to carboplatin, cisplatin, other chemotherapy, other medicines, foods, dyes, or preservatives  pregnant or trying to get pregnant  breast-feeding How should I use this medicine? This drug is usually given as an infusion into a vein. It is administered in a hospital or clinic by a specially trained health care professional. Talk to your pediatrician regarding the use of this medicine in children. Special care may be needed. Overdosage: If you think you have taken too much of this medicine contact a poison control center or emergency room at once. NOTE: This medicine is only for you. Do not share this medicine with others. What if I miss a dose? It is  important not to miss a dose. Call your doctor or health care professional if you are unable to keep an appointment. What may interact with this medicine?  medicines for seizures  medicines to increase blood counts like filgrastim, pegfilgrastim, sargramostim  some antibiotics like amikacin, gentamicin, neomycin, streptomycin, tobramycin  vaccines Talk to your doctor or health care professional before taking any of these medicines:  acetaminophen  aspirin  ibuprofen  ketoprofen  naproxen This list may not describe all possible interactions. Give your health care provider a list of all the medicines, herbs, non-prescription drugs, or dietary supplements you use. Also tell them if you smoke, drink alcohol, or use illegal drugs. Some items may interact with your medicine. What should I watch for while using this medicine? Your condition will be monitored carefully while you are receiving this medicine. You will need important blood work done while you are taking this medicine. This drug may make you feel generally unwell. This is not uncommon, as chemotherapy can affect healthy cells as well as cancer cells. Report any side effects. Continue your course of treatment even though you feel ill unless your doctor tells you to stop. In some cases, you may be given additional medicines to help with side effects. Follow all directions for their use. Call your doctor or health care professional for advice if you get a fever, chills or sore throat, or other symptoms of a cold or flu. Do not treat yourself. This drug decreases your body's ability to fight infections. Try to avoid being around people who are sick. This medicine may increase your risk to bruise or bleed. Call your doctor or health care professional if you notice any unusual bleeding. Be careful brushing and flossing your teeth or using a toothpick because you may get an infection or bleed more easily. If you have any dental work done,  tell your dentist you are receiving this medicine. Avoid taking products that contain aspirin, acetaminophen, ibuprofen,  naproxen, or ketoprofen unless instructed by your doctor. These medicines may hide a fever. Do not become pregnant while taking this medicine. Women should inform their doctor if they wish to become pregnant or think they might be pregnant. There is a potential for serious side effects to an unborn child. Talk to your health care professional or pharmacist for more information. Do not breast-feed an infant while taking this medicine. What side effects may I notice from receiving this medicine? Side effects that you should report to your doctor or health care professional as soon as possible:  allergic reactions like skin rash, itching or hives, swelling of the face, lips, or tongue  signs of infection - fever or chills, cough, sore throat, pain or difficulty passing urine  signs of decreased platelets or bleeding - bruising, pinpoint red spots on the skin, black, tarry stools, nosebleeds  signs of decreased red blood cells - unusually weak or tired, fainting spells, lightheadedness  breathing problems  changes in hearing  changes in vision  chest pain  high blood pressure  low blood counts - This drug may decrease the number of white blood cells, red blood cells and platelets. You may be at increased risk for infections and bleeding.  nausea and vomiting  pain, swelling, redness or irritation at the injection site  pain, tingling, numbness in the hands or feet  problems with balance, talking, walking  trouble passing urine or change in the amount of urine Side effects that usually do not require medical attention (report to your doctor or health care professional if they continue or are bothersome):  hair loss  loss of appetite  metallic taste in the mouth or changes in taste This list may not describe all possible side effects. Call your doctor for  medical advice about side effects. You may report side effects to FDA at 1-800-FDA-1088. Where should I keep my medicine? This drug is given in a hospital or clinic and will not be stored at home. NOTE: This sheet is a summary. It may not cover all possible information. If you have questions about this medicine, talk to your doctor, pharmacist, or health care provider.  2021 Elsevier/Gold Standard (2008-01-27 14:38:05) Etoposide, VP-16 injection What is this medicine? ETOPOSIDE, VP-16 (e toe POE side) is a chemotherapy drug. It is used to treat testicular cancer, lung cancer, and other cancers. This medicine may be used for other purposes; ask your health care provider or pharmacist if you have questions. COMMON BRAND NAME(S): Etopophos, Toposar, VePesid What should I tell my health care provider before I take this medicine? They need to know if you have any of these conditions:  infection  kidney disease  liver disease  low blood counts, like low white cell, platelet, or red cell counts  an unusual or allergic reaction to etoposide, other medicines, foods, dyes, or preservatives  pregnant or trying to get pregnant  breast-feeding How should I use this medicine? This medicine is for infusion into a vein. It is administered in a hospital or clinic by a specially trained health care professional. Talk to your pediatrician regarding the use of this medicine in children. Special care may be needed. Overdosage: If you think you have taken too much of this medicine contact a poison control center or emergency room at once. NOTE: This medicine is only for you. Do not share this medicine with others. What if I miss a dose? It is important not to miss your dose. Call your doctor or health  care professional if you are unable to keep an appointment. What may interact with this medicine? This medicine may interact with the following medications:  warfarin This list may not describe all  possible interactions. Give your health care provider a list of all the medicines, herbs, non-prescription drugs, or dietary supplements you use. Also tell them if you smoke, drink alcohol, or use illegal drugs. Some items may interact with your medicine. What should I watch for while using this medicine? Visit your doctor for checks on your progress. This drug may make you feel generally unwell. This is not uncommon, as chemotherapy can affect healthy cells as well as cancer cells. Report any side effects. Continue your course of treatment even though you feel ill unless your doctor tells you to stop. In some cases, you may be given additional medicines to help with side effects. Follow all directions for their use. Call your doctor or health care professional for advice if you get a fever, chills or sore throat, or other symptoms of a cold or flu. Do not treat yourself. This drug decreases your body's ability to fight infections. Try to avoid being around people who are sick. This medicine may increase your risk to bruise or bleed. Call your doctor or health care professional if you notice any unusual bleeding. Talk to your doctor about your risk of cancer. You may be more at risk for certain types of cancers if you take this medicine. Do not become pregnant while taking this medicine or for at least 6 months after stopping it. Women should inform their doctor if they wish to become pregnant or think they might be pregnant. Women of child-bearing potential will need to have a negative pregnancy test before starting this medicine. There is a potential for serious side effects to an unborn child. Talk to your health care professional or pharmacist for more information. Do not breast-feed an infant while taking this medicine. Men must use a latex condom during sexual contact with a woman while taking this medicine and for at least 4 months after stopping it. A latex condom is needed even if you have had a  vasectomy. Contact your doctor right away if your partner becomes pregnant. Do not donate sperm while taking this medicine and for at least 4 months after you stop taking this medicine. Men should inform their doctors if they wish to father a child. This medicine may lower sperm counts. What side effects may I notice from receiving this medicine? Side effects that you should report to your doctor or health care professional as soon as possible:  allergic reactions like skin rash, itching or hives, swelling of the face, lips, or tongue  low blood counts - this medicine may decrease the number of white blood cells, red blood cells, and platelets. You may be at increased risk for infections and bleeding  nausea, vomiting  redness, blistering, peeling or loosening of the skin, including inside the mouth  signs and symptoms of infection like fever; chills; cough; sore throat; pain or trouble passing urine  signs and symptoms of low red blood cells or anemia such as unusually weak or tired; feeling faint or lightheaded; falls; breathing problems  unusual bruising or bleeding Side effects that usually do not require medical attention (report to your doctor or health care professional if they continue or are bothersome):  changes in taste  diarrhea  hair loss  loss of appetite  mouth sores This list may not describe all possible side  effects. Call your doctor for medical advice about side effects. You may report side effects to FDA at 1-800-FDA-1088. Where should I keep my medicine? This drug is given in a hospital or clinic and will not be stored at home. NOTE: This sheet is a summary. It may not cover all possible information. If you have questions about this medicine, talk to your doctor, pharmacist, or health care provider.  2021 Elsevier/Gold Standard (2018-12-17 16:57:15) Atezolizumab injection What is this medicine? ATEZOLIZUMAB (a te zoe LIZ ue mab) is a monoclonal antibody. It is  used to treat bladder cancer (urothelial cancer), liver cancer, lung cancer, and melanoma. This medicine may be used for other purposes; ask your health care provider or pharmacist if you have questions. COMMON BRAND NAME(S): Tecentriq What should I tell my health care provider before I take this medicine? They need to know if you have any of these conditions:  autoimmune diseases like Crohn's disease, ulcerative colitis, or lupus  have had or planning to have an allogeneic stem cell transplant (uses someone else's stem cells)  history of organ transplant  history of radiation to the chest  nervous system problems like myasthenia gravis or Guillain-Barre syndrome  an unusual or allergic reaction to atezolizumab, other medicines, foods, dyes, or preservatives  pregnant or trying to get pregnant  breast-feeding How should I use this medicine? This medicine is for infusion into a vein. It is given by a health care professional in a hospital or clinic setting. A special MedGuide will be given to you before each treatment. Be sure to read this information carefully each time. Talk to your pediatrician regarding the use of this medicine in children. Special care may be needed. Overdosage: If you think you have taken too much of this medicine contact a poison control center or emergency room at once. NOTE: This medicine is only for you. Do not share this medicine with others. What if I miss a dose? It is important not to miss your dose. Call your doctor or health care professional if you are unable to keep an appointment. What may interact with this medicine? Interactions have not been studied. This list may not describe all possible interactions. Give your health care provider a list of all the medicines, herbs, non-prescription drugs, or dietary supplements you use. Also tell them if you smoke, drink alcohol, or use illegal drugs. Some items may interact with your medicine. What should I  watch for while using this medicine? Your condition will be monitored carefully while you are receiving this medicine. You may need blood work done while you are taking this medicine. Do not become pregnant while taking this medicine or for at least 5 months after stopping it. Women should inform their doctor if they wish to become pregnant or think they might be pregnant. There is a potential for serious side effects to an unborn child. Talk to your health care professional or pharmacist for more information. Do not breast-feed an infant while taking this medicine or for at least 5 months after the last dose. What side effects may I notice from receiving this medicine? Side effects that you should report to your doctor or health care professional as soon as possible:  allergic reactions like skin rash, itching or hives, swelling of the face, lips, or tongue  black, tarry stools  bloody or watery diarrhea  breathing problems  changes in vision  chest pain or chest tightness  chills  facial flushing  fever  headache  signs and symptoms of high blood sugar such as dizziness; dry mouth; dry skin; fruity breath; nausea; stomach pain; increased hunger or thirst; increased urination  signs and symptoms of liver injury like dark yellow or brown urine; general ill feeling or flu-like symptoms; light-colored stools; loss of appetite; nausea; right upper belly pain; unusually weak or tired; yellowing of the eyes or skin  stomach pain  trouble passing urine or change in the amount of urine Side effects that usually do not require medical attention (report to your doctor or health care professional if they continue or are bothersome):  bone pain  cough  diarrhea  joint pain  muscle pain  muscle weakness  swelling of arms or legs  tiredness  weight loss This list may not describe all possible side effects. Call your doctor for medical advice about side effects. You may report  side effects to FDA at 1-800-FDA-1088. Where should I keep my medicine? This drug is given in a hospital or clinic and will not be stored at home. NOTE: This sheet is a summary. It may not cover all possible information. If you have questions about this medicine, talk to your doctor, pharmacist, or health care provider.  2021 Elsevier/Gold Standard (2020-07-21 13:59:34) [facility]     Discharge Instructions: Thank you for choosing [facility] to provide your oncology and hematology care.  If you have a lab appointment with the [facility], please go directly to the [facility] and check in at the registration area.  Wear comfortable clothing and clothing appropriate for easy access to any Portacath or PICC line.   We strive to give you quality time with your provider. You may need to reschedule your appointment if you arrive late (15 or more minutes).  Arriving late affects you and other patients whose appointments are after yours.  Also, if you miss three or more appointments without notifying the office, you may be dismissed from the clinic at the provider's discretion.      For prescription refill requests, have your pharmacy contact our office and allow 72 hours for refills to be completed.    Today you received the following chemotherapy and/or immunotherapy agents Tecentriq, Carboplatin, Etoposide      To help prevent nausea and vomiting after your treatment, we encourage you to take your nausea medication as directed.  BELOW ARE SYMPTOMS THAT SHOULD BE REPORTED IMMEDIATELY: . *FEVER GREATER THAN 100.4 F (38 C) OR HIGHER . *CHILLS OR SWEATING . *NAUSEA AND VOMITING THAT IS NOT CONTROLLED WITH YOUR NAUSEA MEDICATION . *UNUSUAL SHORTNESS OF BREATH . *UNUSUAL BRUISING OR BLEEDING . *URINARY PROBLEMS (pain or burning when urinating, or frequent urination) . *BOWEL PROBLEMS (unusual diarrhea, constipation, pain near the  anus) . TENDERNESS IN MOUTH AND THROAT WITH OR WITHOUT PRESENCE OF ULCERS (sore throat, sores in mouth, or a toothache) . UNUSUAL RASH, SWELLING OR PAIN  . UNUSUAL VAGINAL DISCHARGE OR ITCHING   Items with * indicate a potential emergency and should be followed up as soon as possible or go to the Emergency Department if any problems should occur.  Please show the CHEMOTHERAPY ALERT CARD or IMMUNOTHERAPY ALERT CARD at check-in to the Emergency Department and triage nurse.  Should you have questions after your visit or need to cancel or reschedule your appointment, please contact [facility]  [phone] and follow the prompts.  Office hours are 8:00 a.m. to 4:30 p.m. Monday - Friday.  Please note that voicemails left after 4:00 p.m. may not be returned until the following business day.  We are closed weekends and major holidays. You have access to a nurse at all times for urgent questions. Please call the main number to the clinic [phone] and follow the prompts.  For any non-urgent questions, you may also contact your provider using MyChart. We now offer e-Visits for anyone 1 and older to request care online for non-urgent symptoms. For details visit [email or web address].   Also download the MyChart app! Go to the app store, search "MyChart", open the app, select [facility], and log in with your MyChart username and password.  Due to Covid, a mask is required upon entering the hospital/clinic. If you do not have a mask, one will be given to you upon arrival. For doctor visits, patients may have 1 support person aged 60 or older with them. For treatment visits, patients cannot have anyone with them due to current Covid guidelines and our immunocompromised population.

## 2021-03-16 ENCOUNTER — Telehealth: Payer: Self-pay | Admitting: Adult Health Nurse Practitioner

## 2021-03-16 ENCOUNTER — Ambulatory Visit: Payer: Medicare HMO

## 2021-03-16 ENCOUNTER — Inpatient Hospital Stay: Payer: Medicare HMO

## 2021-03-16 VITALS — BP 110/66 | HR 47 | Temp 97.2°F

## 2021-03-16 DIAGNOSIS — R5383 Other fatigue: Secondary | ICD-10-CM | POA: Diagnosis not present

## 2021-03-16 DIAGNOSIS — Z5189 Encounter for other specified aftercare: Secondary | ICD-10-CM | POA: Diagnosis not present

## 2021-03-16 DIAGNOSIS — Z5112 Encounter for antineoplastic immunotherapy: Secondary | ICD-10-CM | POA: Diagnosis not present

## 2021-03-16 DIAGNOSIS — C7A1 Malignant poorly differentiated neuroendocrine tumors: Secondary | ICD-10-CM | POA: Diagnosis not present

## 2021-03-16 DIAGNOSIS — Z5111 Encounter for antineoplastic chemotherapy: Secondary | ICD-10-CM | POA: Diagnosis not present

## 2021-03-16 DIAGNOSIS — K5900 Constipation, unspecified: Secondary | ICD-10-CM | POA: Diagnosis not present

## 2021-03-16 DIAGNOSIS — R739 Hyperglycemia, unspecified: Secondary | ICD-10-CM | POA: Diagnosis not present

## 2021-03-16 DIAGNOSIS — E861 Hypovolemia: Secondary | ICD-10-CM | POA: Diagnosis not present

## 2021-03-16 DIAGNOSIS — C7802 Secondary malignant neoplasm of left lung: Secondary | ICD-10-CM

## 2021-03-16 DIAGNOSIS — C7B8 Other secondary neuroendocrine tumors: Secondary | ICD-10-CM | POA: Diagnosis not present

## 2021-03-16 LAB — T4: T4, Total: 5.2 ug/dL (ref 4.5–12.0)

## 2021-03-16 MED ORDER — SODIUM CHLORIDE 0.9 % IV SOLN
Freq: Once | INTRAVENOUS | Status: AC
  Filled 2021-03-16: qty 250

## 2021-03-16 MED ORDER — SODIUM CHLORIDE 0.9 % IV SOLN
100.0000 mg/m2 | Freq: Once | INTRAVENOUS | Status: AC
  Administered 2021-03-16: 170 mg via INTRAVENOUS
  Filled 2021-03-16: qty 8.5

## 2021-03-16 MED ORDER — HEPARIN SOD (PORK) LOCK FLUSH 100 UNIT/ML IV SOLN
500.0000 [IU] | Freq: Once | INTRAVENOUS | Status: AC | PRN
  Administered 2021-03-16: 500 [IU]
  Filled 2021-03-16: qty 5

## 2021-03-16 MED ORDER — HEPARIN SOD (PORK) LOCK FLUSH 100 UNIT/ML IV SOLN
INTRAVENOUS | Status: AC
  Filled 2021-03-16: qty 5

## 2021-03-16 MED ORDER — SODIUM CHLORIDE 0.9 % IV SOLN
10.0000 mg | Freq: Once | INTRAVENOUS | Status: AC
  Administered 2021-03-16: 10 mg via INTRAVENOUS
  Filled 2021-03-16: qty 10

## 2021-03-16 NOTE — Patient Instructions (Signed)
[facility]  Discharge Instructions: Thank you for choosing [facility] to provide your oncology and hematology care.  If you have a lab appointment with the Cancer Center, please go directly to the Cancer Center and check in at the registration area.  Wear comfortable clothing and clothing appropriate for easy access to any Portacath or PICC line.   We strive to give you quality time with your provider. You may need to reschedule your appointment if you arrive late (15 or more minutes).  Arriving late affects you and other patients whose appointments are after yours.  Also, if you miss three or more appointments without notifying the office, you may be dismissed from the clinic at the provider's discretion.      For prescription refill requests, have your pharmacy contact our office and allow 72 hours for refills to be completed.    Today you received the following chemotherapy and/or immunotherapy agents Etoposide     To help prevent nausea and vomiting after your treatment, we encourage you to take your nausea medication as directed.  BELOW ARE SYMPTOMS THAT SHOULD BE REPORTED IMMEDIATELY: . *FEVER GREATER THAN 100.4 F (38 C) OR HIGHER . *CHILLS OR SWEATING . *NAUSEA AND VOMITING THAT IS NOT CONTROLLED WITH YOUR NAUSEA MEDICATION . *UNUSUAL SHORTNESS OF BREATH . *UNUSUAL BRUISING OR BLEEDING . *URINARY PROBLEMS (pain or burning when urinating, or frequent urination) . *BOWEL PROBLEMS (unusual diarrhea, constipation, pain near the anus) . TENDERNESS IN MOUTH AND THROAT WITH OR WITHOUT PRESENCE OF ULCERS (sore throat, sores in mouth, or a toothache) . UNUSUAL RASH, SWELLING OR PAIN  . UNUSUAL VAGINAL DISCHARGE OR ITCHING   Items with * indicate a potential emergency and should be followed up as soon as possible or go to the Emergency Department if any problems should occur.  Please show the CHEMOTHERAPY ALERT CARD or IMMUNOTHERAPY ALERT  CARD at check-in to the Emergency Department and triage nurse.  Should you have questions after your visit or need to cancel or reschedule your appointment, please contact [facility]  [phone] and follow the prompts.  Office hours are 8:00 a.m. to 4:30 p.m. Monday - Friday. Please note that voicemails left after 4:00 p.m. may not be returned until the following business day.  We are closed weekends and major holidays. You have access to a nurse at all times for urgent questions. Please call the main number to the clinic [phone] and follow the prompts.  For any non-urgent questions, you may also contact your provider using MyChart. We now offer e-Visits for anyone 18 and older to request care online for non-urgent symptoms. For details visit [email or web address].   Also download the MyChart app! Go to the app store, search "MyChart", open the app, select [facility], and log in with your MyChart username and password.  Due to Covid, a mask is required upon entering the hospital/clinic. If you do not have a mask, one will be given to you upon arrival. For doctor visits, patients may have 1 support person aged 18 or older with them. For treatment visits, patients cannot have anyone with them due to current Covid guidelines and our immunocompromised population.  

## 2021-03-16 NOTE — Telephone Encounter (Signed)
Spoke with wife, Enid Derry, to offer to schedule a Palliative Consult,  and she reqiested that I call her back in the morning, she was getting ready to take patient to a chemo treatment and she wasn't sure when they would be back home this afternoon.  Told wife I would call back around 9 am in the morning and she was in agreement with this.

## 2021-03-17 ENCOUNTER — Telehealth: Payer: Self-pay | Admitting: Adult Health Nurse Practitioner

## 2021-03-17 ENCOUNTER — Inpatient Hospital Stay: Payer: Medicare HMO

## 2021-03-17 ENCOUNTER — Other Ambulatory Visit: Payer: Self-pay

## 2021-03-17 ENCOUNTER — Ambulatory Visit: Payer: Medicare HMO

## 2021-03-17 VITALS — BP 99/64 | HR 54 | Temp 96.2°F

## 2021-03-17 DIAGNOSIS — Z5111 Encounter for antineoplastic chemotherapy: Secondary | ICD-10-CM | POA: Diagnosis not present

## 2021-03-17 DIAGNOSIS — Z5112 Encounter for antineoplastic immunotherapy: Secondary | ICD-10-CM | POA: Diagnosis not present

## 2021-03-17 DIAGNOSIS — E861 Hypovolemia: Secondary | ICD-10-CM | POA: Diagnosis not present

## 2021-03-17 DIAGNOSIS — Z5189 Encounter for other specified aftercare: Secondary | ICD-10-CM | POA: Diagnosis not present

## 2021-03-17 DIAGNOSIS — C7A1 Malignant poorly differentiated neuroendocrine tumors: Secondary | ICD-10-CM | POA: Diagnosis not present

## 2021-03-17 DIAGNOSIS — R739 Hyperglycemia, unspecified: Secondary | ICD-10-CM | POA: Diagnosis not present

## 2021-03-17 DIAGNOSIS — C7B8 Other secondary neuroendocrine tumors: Secondary | ICD-10-CM | POA: Diagnosis not present

## 2021-03-17 DIAGNOSIS — K5900 Constipation, unspecified: Secondary | ICD-10-CM | POA: Diagnosis not present

## 2021-03-17 DIAGNOSIS — C7802 Secondary malignant neoplasm of left lung: Secondary | ICD-10-CM

## 2021-03-17 DIAGNOSIS — R5383 Other fatigue: Secondary | ICD-10-CM | POA: Diagnosis not present

## 2021-03-17 MED ORDER — SODIUM CHLORIDE 0.9% FLUSH
10.0000 mL | INTRAVENOUS | Status: DC | PRN
  Administered 2021-03-17: 10 mL
  Filled 2021-03-17: qty 10

## 2021-03-17 MED ORDER — SODIUM CHLORIDE 0.9 % IV SOLN
100.0000 mg/m2 | Freq: Once | INTRAVENOUS | Status: AC
  Administered 2021-03-17: 170 mg via INTRAVENOUS
  Filled 2021-03-17: qty 8.5

## 2021-03-17 MED ORDER — HEPARIN SOD (PORK) LOCK FLUSH 100 UNIT/ML IV SOLN
500.0000 [IU] | Freq: Once | INTRAVENOUS | Status: AC | PRN
  Administered 2021-03-17: 500 [IU]
  Filled 2021-03-17: qty 5

## 2021-03-17 MED ORDER — SODIUM CHLORIDE 0.9 % IV SOLN
10.0000 mg | Freq: Once | INTRAVENOUS | Status: AC
  Administered 2021-03-17: 10 mg via INTRAVENOUS
  Filled 2021-03-17: qty 10

## 2021-03-17 MED ORDER — SODIUM CHLORIDE 0.9 % IV SOLN
Freq: Once | INTRAVENOUS | Status: AC
  Filled 2021-03-17: qty 250

## 2021-03-17 NOTE — Telephone Encounter (Signed)
Spoke with patient's wife, Enid Derry, regarding the Palliative referral/services and all questions were answered and she was in agreement with scheduling visit.  I have scheduled an In-home Consult for 03/27/21 @ 3 PM.

## 2021-03-17 NOTE — Patient Instructions (Signed)
[facility]ENTER [facility]  Discharge Instructions: Thank you for choosing [facility] to provide your oncology and hematology care.  If you have a lab appointment with the Cancer Center, please go directly to the Cancer Center and check in at the registration area.  Wear comfortable clothing and clothing appropriate for easy access to any Portacath or PICC line.   We strive to give you quality time with your provider. You may need to reschedule your appointment if you arrive late (15 or more minutes).  Arriving late affects you and other patients whose appointments are after yours.  Also, if you miss three or more appointments without notifying the office, you may be dismissed from the clinic at the provider's discretion.      For prescription refill requests, have your pharmacy contact our office and allow 72 hours for refills to be completed.      To help prevent nausea and vomiting after your treatment, we encourage you to take your nausea medication as directed.  BELOW ARE SYMPTOMS THAT SHOULD BE REPORTED IMMEDIATELY: *FEVER GREATER THAN 100.4 F (38 C) OR HIGHER *CHILLS OR SWEATING *NAUSEA AND VOMITING THAT IS NOT CONTROLLED WITH YOUR NAUSEA MEDICATION *UNUSUAL SHORTNESS OF BREATH *UNUSUAL BRUISING OR BLEEDING *URINARY PROBLEMS (pain or burning when urinating, or frequent urination) *BOWEL PROBLEMS (unusual diarrhea, constipation, pain near the anus) TENDERNESS IN MOUTH AND THROAT WITH OR WITHOUT PRESENCE OF ULCERS (sore throat, sores in mouth, or a toothache) UNUSUAL RASH, SWELLING OR PAIN  UNUSUAL VAGINAL DISCHARGE OR ITCHING   Items with * indicate a potential emergency and should be followed up as soon as possible or go to the Emergency Department if any problems should occur.  Please show the CHEMOTHERAPY ALERT CARD or IMMUNOTHERAPY ALERT CARD at check-in to the Emergency Department and triage nurse.  Should you have questions after your  visit or need to cancel or reschedule your appointment, please contact [facility]  [phone] and follow the prompts.  Office hours are 8:00 a.m. to 4:30 p.m. Monday - Friday. Please note that voicemails left after 4:00 p.m. may not be returned until the following business day.  We are closed weekends and major holidays. You have access to a nurse at all times for urgent questions. Please call the main number to the clinic [phone] and follow the prompts.  For any non-urgent questions, you may also contact your provider using MyChart. We now offer e-Visits for anyone 18 and older to request care online for non-urgent symptoms. For details visit [email or web address].   Also download the MyChart app! Go to the app store, search "MyChart", open the app, select [facility], and log in with your MyChart username and password.  Due to Covid, a mask is required upon entering the hospital/clinic. If you do not have a mask, one will be given to you upon arrival. For doctor visits, patients may have 1 support person aged 18 or older with them. For treatment visits, patients cannot have anyone with them due to current Covid guidelines and our immunocompromised population.  

## 2021-03-20 ENCOUNTER — Other Ambulatory Visit: Payer: Self-pay

## 2021-03-20 ENCOUNTER — Inpatient Hospital Stay: Payer: Medicare HMO

## 2021-03-20 ENCOUNTER — Other Ambulatory Visit: Payer: Self-pay | Admitting: *Deleted

## 2021-03-20 ENCOUNTER — Inpatient Hospital Stay (HOSPITAL_BASED_OUTPATIENT_CLINIC_OR_DEPARTMENT_OTHER): Payer: Medicare HMO | Admitting: Nurse Practitioner

## 2021-03-20 ENCOUNTER — Emergency Department: Payer: Medicare HMO

## 2021-03-20 ENCOUNTER — Inpatient Hospital Stay
Admission: EM | Admit: 2021-03-20 | Discharge: 2021-03-28 | DRG: 808 | Disposition: A | Discharge status: Skilled Nursing Facility | Payer: Medicare HMO | Attending: Internal Medicine | Admitting: Internal Medicine

## 2021-03-20 ENCOUNTER — Ambulatory Visit: Payer: Medicare HMO

## 2021-03-20 ENCOUNTER — Encounter: Payer: Self-pay | Admitting: Emergency Medicine

## 2021-03-20 ENCOUNTER — Observation Stay: Payer: Medicare HMO

## 2021-03-20 VITALS — BP 90/66 | HR 113 | Temp 98.3°F | Resp 22

## 2021-03-20 DIAGNOSIS — I2510 Atherosclerotic heart disease of native coronary artery without angina pectoris: Secondary | ICD-10-CM | POA: Diagnosis present

## 2021-03-20 DIAGNOSIS — C3492 Malignant neoplasm of unspecified part of left bronchus or lung: Secondary | ICD-10-CM | POA: Diagnosis present

## 2021-03-20 DIAGNOSIS — Z87891 Personal history of nicotine dependence: Secondary | ICD-10-CM

## 2021-03-20 DIAGNOSIS — Z515 Encounter for palliative care: Secondary | ICD-10-CM

## 2021-03-20 DIAGNOSIS — I10 Essential (primary) hypertension: Secondary | ICD-10-CM | POA: Diagnosis present

## 2021-03-20 DIAGNOSIS — D61810 Antineoplastic chemotherapy induced pancytopenia: Principal | ICD-10-CM | POA: Diagnosis present

## 2021-03-20 DIAGNOSIS — R17 Unspecified jaundice: Secondary | ICD-10-CM | POA: Diagnosis present

## 2021-03-20 DIAGNOSIS — M2550 Pain in unspecified joint: Secondary | ICD-10-CM | POA: Diagnosis not present

## 2021-03-20 DIAGNOSIS — G319 Degenerative disease of nervous system, unspecified: Secondary | ICD-10-CM | POA: Diagnosis not present

## 2021-03-20 DIAGNOSIS — E440 Moderate protein-calorie malnutrition: Secondary | ICD-10-CM

## 2021-03-20 DIAGNOSIS — E876 Hypokalemia: Principal | ICD-10-CM

## 2021-03-20 DIAGNOSIS — R0902 Hypoxemia: Secondary | ICD-10-CM | POA: Diagnosis not present

## 2021-03-20 DIAGNOSIS — Z66 Do not resuscitate: Secondary | ICD-10-CM | POA: Diagnosis present

## 2021-03-20 DIAGNOSIS — C7802 Secondary malignant neoplasm of left lung: Secondary | ICD-10-CM

## 2021-03-20 DIAGNOSIS — E1165 Type 2 diabetes mellitus with hyperglycemia: Secondary | ICD-10-CM | POA: Diagnosis present

## 2021-03-20 DIAGNOSIS — E43 Unspecified severe protein-calorie malnutrition: Secondary | ICD-10-CM

## 2021-03-20 DIAGNOSIS — C7B8 Other secondary neuroendocrine tumors: Secondary | ICD-10-CM | POA: Diagnosis not present

## 2021-03-20 DIAGNOSIS — K5909 Other constipation: Secondary | ICD-10-CM | POA: Diagnosis not present

## 2021-03-20 DIAGNOSIS — R5383 Other fatigue: Secondary | ICD-10-CM

## 2021-03-20 DIAGNOSIS — R339 Retention of urine, unspecified: Secondary | ICD-10-CM | POA: Diagnosis present

## 2021-03-20 DIAGNOSIS — Z9114 Patient's other noncompliance with medication regimen: Secondary | ICD-10-CM

## 2021-03-20 DIAGNOSIS — T380X5A Adverse effect of glucocorticoids and synthetic analogues, initial encounter: Secondary | ICD-10-CM | POA: Diagnosis present

## 2021-03-20 DIAGNOSIS — I252 Old myocardial infarction: Secondary | ICD-10-CM

## 2021-03-20 DIAGNOSIS — Z79899 Other long term (current) drug therapy: Secondary | ICD-10-CM

## 2021-03-20 DIAGNOSIS — Z20822 Contact with and (suspected) exposure to covid-19: Secondary | ICD-10-CM | POA: Diagnosis present

## 2021-03-20 DIAGNOSIS — D61818 Other pancytopenia: Secondary | ICD-10-CM | POA: Diagnosis not present

## 2021-03-20 DIAGNOSIS — E871 Hypo-osmolality and hyponatremia: Secondary | ICD-10-CM | POA: Diagnosis present

## 2021-03-20 DIAGNOSIS — R627 Adult failure to thrive: Secondary | ICD-10-CM | POA: Diagnosis present

## 2021-03-20 DIAGNOSIS — Z7982 Long term (current) use of aspirin: Secondary | ICD-10-CM

## 2021-03-20 DIAGNOSIS — Z888 Allergy status to other drugs, medicaments and biological substances status: Secondary | ICD-10-CM

## 2021-03-20 DIAGNOSIS — D701 Agranulocytosis secondary to cancer chemotherapy: Secondary | ICD-10-CM | POA: Diagnosis present

## 2021-03-20 DIAGNOSIS — C7A8 Other malignant neuroendocrine tumors: Secondary | ICD-10-CM

## 2021-03-20 DIAGNOSIS — Z801 Family history of malignant neoplasm of trachea, bronchus and lung: Secondary | ICD-10-CM | POA: Diagnosis not present

## 2021-03-20 DIAGNOSIS — R5381 Other malaise: Secondary | ICD-10-CM | POA: Diagnosis not present

## 2021-03-20 DIAGNOSIS — G9389 Other specified disorders of brain: Secondary | ICD-10-CM | POA: Diagnosis not present

## 2021-03-20 DIAGNOSIS — C7931 Secondary malignant neoplasm of brain: Secondary | ICD-10-CM

## 2021-03-20 DIAGNOSIS — I219 Acute myocardial infarction, unspecified: Secondary | ICD-10-CM | POA: Diagnosis not present

## 2021-03-20 DIAGNOSIS — K5900 Constipation, unspecified: Secondary | ICD-10-CM | POA: Diagnosis present

## 2021-03-20 DIAGNOSIS — C7A1 Malignant poorly differentiated neuroendocrine tumors: Secondary | ICD-10-CM | POA: Diagnosis present

## 2021-03-20 DIAGNOSIS — Z7984 Long term (current) use of oral hypoglycemic drugs: Secondary | ICD-10-CM

## 2021-03-20 DIAGNOSIS — J189 Pneumonia, unspecified organism: Secondary | ICD-10-CM | POA: Diagnosis not present

## 2021-03-20 DIAGNOSIS — T451X5A Adverse effect of antineoplastic and immunosuppressive drugs, initial encounter: Secondary | ICD-10-CM | POA: Diagnosis present

## 2021-03-20 DIAGNOSIS — E2740 Unspecified adrenocortical insufficiency: Secondary | ICD-10-CM | POA: Diagnosis present

## 2021-03-20 DIAGNOSIS — Z86012 Personal history of benign carcinoid tumor: Secondary | ICD-10-CM | POA: Diagnosis not present

## 2021-03-20 DIAGNOSIS — E271 Primary adrenocortical insufficiency: Secondary | ICD-10-CM | POA: Diagnosis not present

## 2021-03-20 DIAGNOSIS — B370 Candidal stomatitis: Secondary | ICD-10-CM | POA: Diagnosis not present

## 2021-03-20 DIAGNOSIS — Z85841 Personal history of malignant neoplasm of brain: Secondary | ICD-10-CM | POA: Diagnosis not present

## 2021-03-20 DIAGNOSIS — R531 Weakness: Secondary | ICD-10-CM | POA: Diagnosis not present

## 2021-03-20 DIAGNOSIS — N200 Calculus of kidney: Secondary | ICD-10-CM | POA: Diagnosis not present

## 2021-03-20 DIAGNOSIS — Z9861 Coronary angioplasty status: Secondary | ICD-10-CM

## 2021-03-20 DIAGNOSIS — Z7401 Bed confinement status: Secondary | ICD-10-CM | POA: Diagnosis not present

## 2021-03-20 DIAGNOSIS — K219 Gastro-esophageal reflux disease without esophagitis: Secondary | ICD-10-CM | POA: Diagnosis present

## 2021-03-20 DIAGNOSIS — R739 Hyperglycemia, unspecified: Secondary | ICD-10-CM | POA: Diagnosis not present

## 2021-03-20 LAB — RESP PANEL BY RT-PCR (FLU A&B, COVID) ARPGX2
Influenza A by PCR: NEGATIVE
Influenza B by PCR: NEGATIVE
SARS Coronavirus 2 by RT PCR: NEGATIVE

## 2021-03-20 LAB — TROPONIN I (HIGH SENSITIVITY)
Troponin I (High Sensitivity): 26 ng/L — ABNORMAL HIGH (ref <18)
Troponin I (High Sensitivity): 25 ng/L — ABNORMAL HIGH (ref <18)

## 2021-03-20 LAB — CBC WITH DIFFERENTIAL/PLATELET
Abs Immature Granulocytes: 0 10*3/uL (ref 0.00–0.07)
Band Neutrophils: 9 %
Basophils Absolute: 0 10*3/uL (ref 0.0–0.1)
Basophils Relative: 0 %
Eosinophils Absolute: 0 10*3/uL (ref 0.0–0.5)
Eosinophils Relative: 0 %
HCT: 27.8 % — ABNORMAL LOW (ref 39.0–52.0)
Hemoglobin: 10.4 g/dL — ABNORMAL LOW (ref 13.0–17.0)
Lymphocytes Relative: 12 %
Lymphs Abs: 0.3 10*3/uL — ABNORMAL LOW (ref 0.7–4.0)
MCH: 32.4 pg (ref 26.0–34.0)
MCHC: 37.4 g/dL — ABNORMAL HIGH (ref 30.0–36.0)
MCV: 86.6 fL (ref 80.0–100.0)
Monocytes Absolute: 0 10*3/uL — ABNORMAL LOW (ref 0.1–1.0)
Monocytes Relative: 1 %
Neutro Abs: 2 10*3/uL (ref 1.7–7.7)
Neutrophils Relative %: 78 %
Platelets: 78 10*3/uL — ABNORMAL LOW (ref 150–400)
RBC: 3.21 MIL/uL — ABNORMAL LOW (ref 4.22–5.81)
RDW: 13.8 % (ref 11.5–15.5)
Smear Review: NORMAL
WBC: 2.3 10*3/uL — ABNORMAL LOW (ref 4.0–10.5)
nRBC: 0 % (ref 0.0–0.2)

## 2021-03-20 LAB — COMPREHENSIVE METABOLIC PANEL
ALT: 25 U/L (ref 0–44)
AST: 18 U/L (ref 15–41)
Albumin: 3 g/dL — ABNORMAL LOW (ref 3.5–5.0)
Alkaline Phosphatase: 37 U/L — ABNORMAL LOW (ref 38–126)
Anion gap: 9 (ref 5–15)
BUN: 20 mg/dL (ref 8–23)
CO2: 23 mmol/L (ref 22–32)
Calcium: 7.9 mg/dL — ABNORMAL LOW (ref 8.9–10.3)
Chloride: 98 mmol/L (ref 98–111)
Creatinine, Ser: 0.38 mg/dL — ABNORMAL LOW (ref 0.61–1.24)
GFR, Estimated: >60 mL/min (ref >60)
Glucose, Bld: 251 mg/dL — ABNORMAL HIGH (ref 70–99)
Potassium: 3 mmol/L — ABNORMAL LOW (ref 3.5–5.1)
Sodium: 130 mmol/L — ABNORMAL LOW (ref 135–145)
Total Bilirubin: 1.8 mg/dL — ABNORMAL HIGH (ref 0.3–1.2)
Total Protein: 5.4 g/dL — ABNORMAL LOW (ref 6.5–8.1)

## 2021-03-20 LAB — LACTIC ACID, PLASMA: Lactic Acid, Venous: 0.9 mmol/L (ref 0.5–1.9)

## 2021-03-20 LAB — MAGNESIUM: Magnesium: 1.7 mg/dL (ref 1.7–2.4)

## 2021-03-20 IMAGING — CR DG CHEST 2V
1 series · 2 of 2 positions shown · non-contrast
Comparison: [DATE] chest radiograph. A PET of [DATE] is
also reviewed.

CLINICAL DATA: Possible sepsis. Weakness. Neuroendocrine carcinoma.

EXAM:
CHEST - 2 VIEW

[Series 1: dg chest 2 view · 0.14mm/px · 2 of 2 slices shown]
[im 1/2]
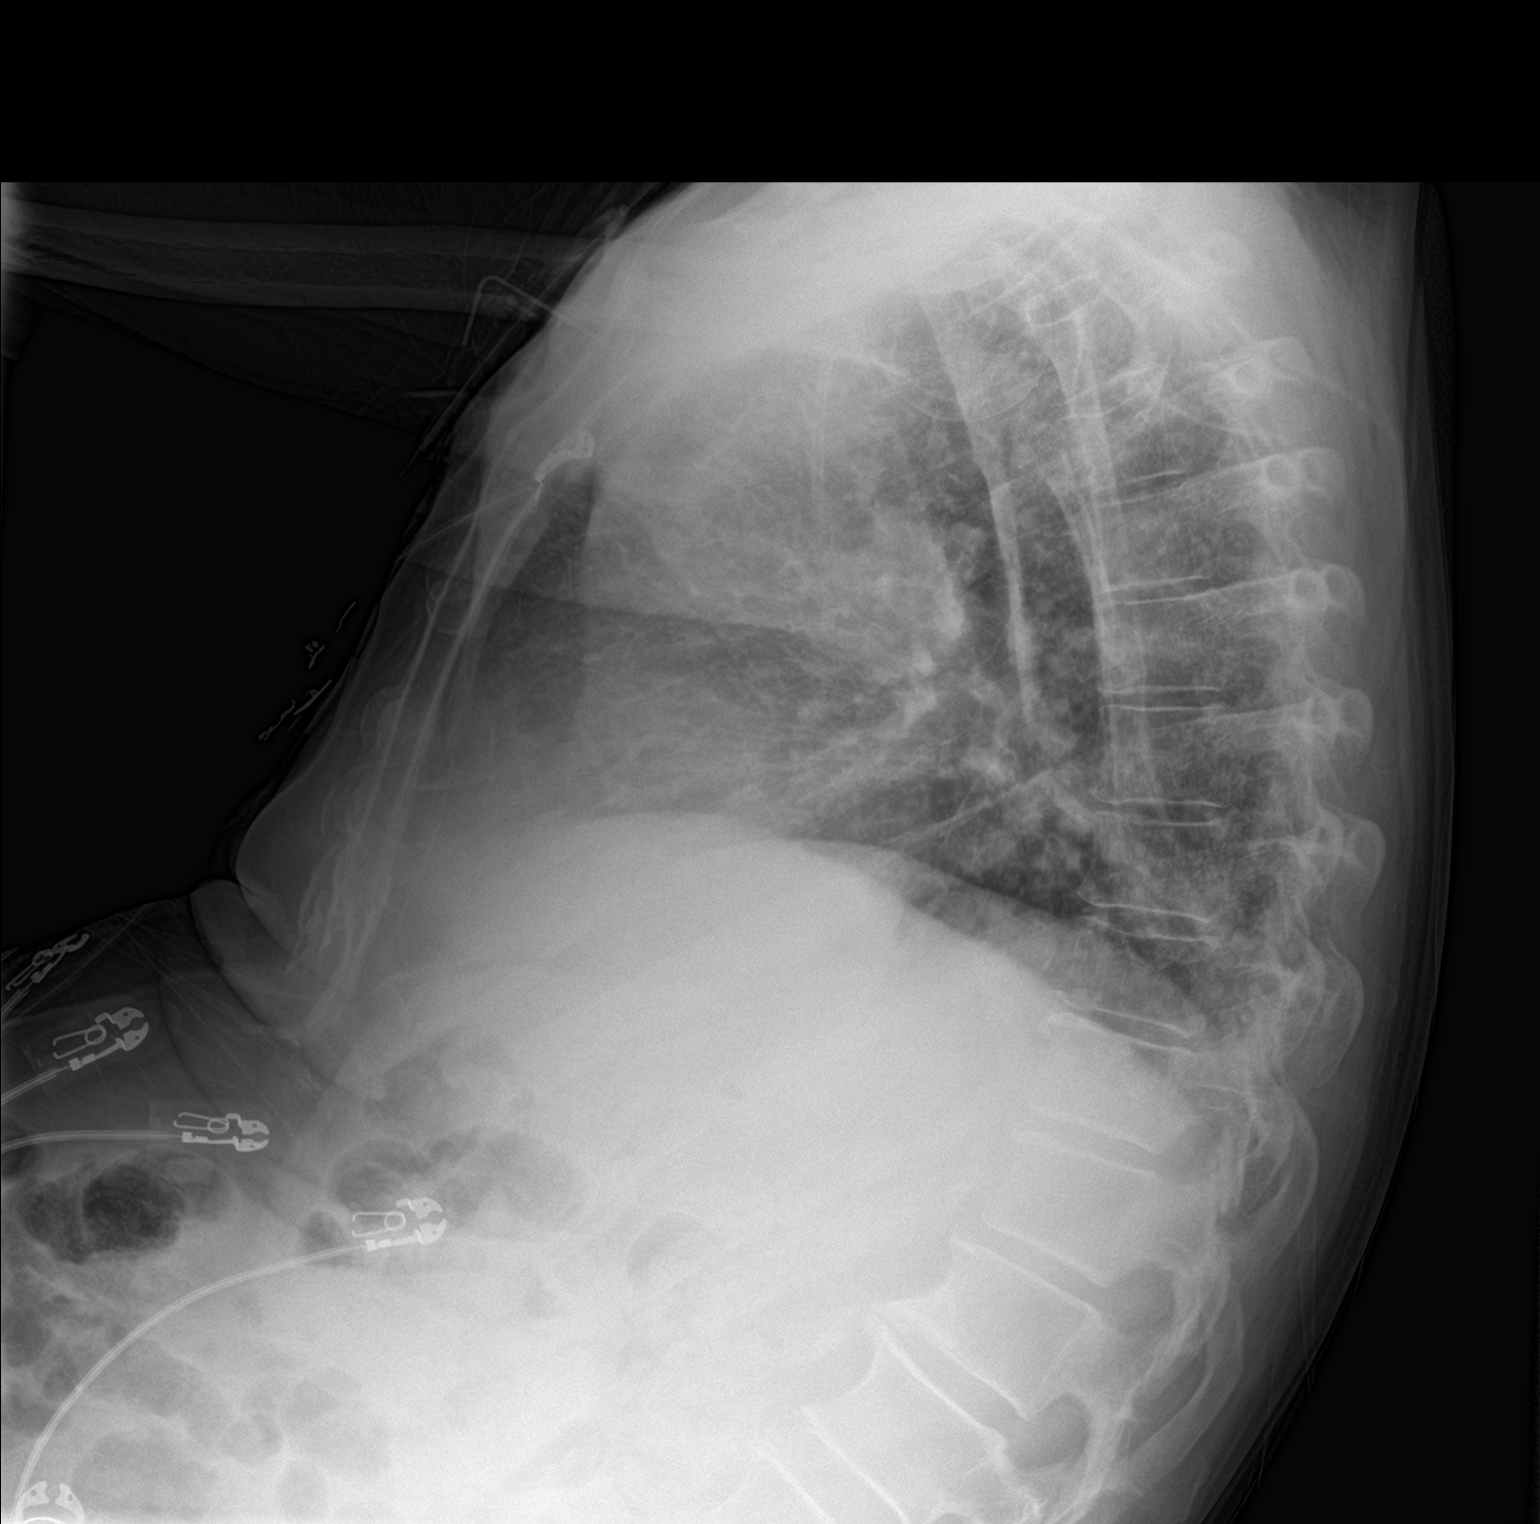
[im 2/2]
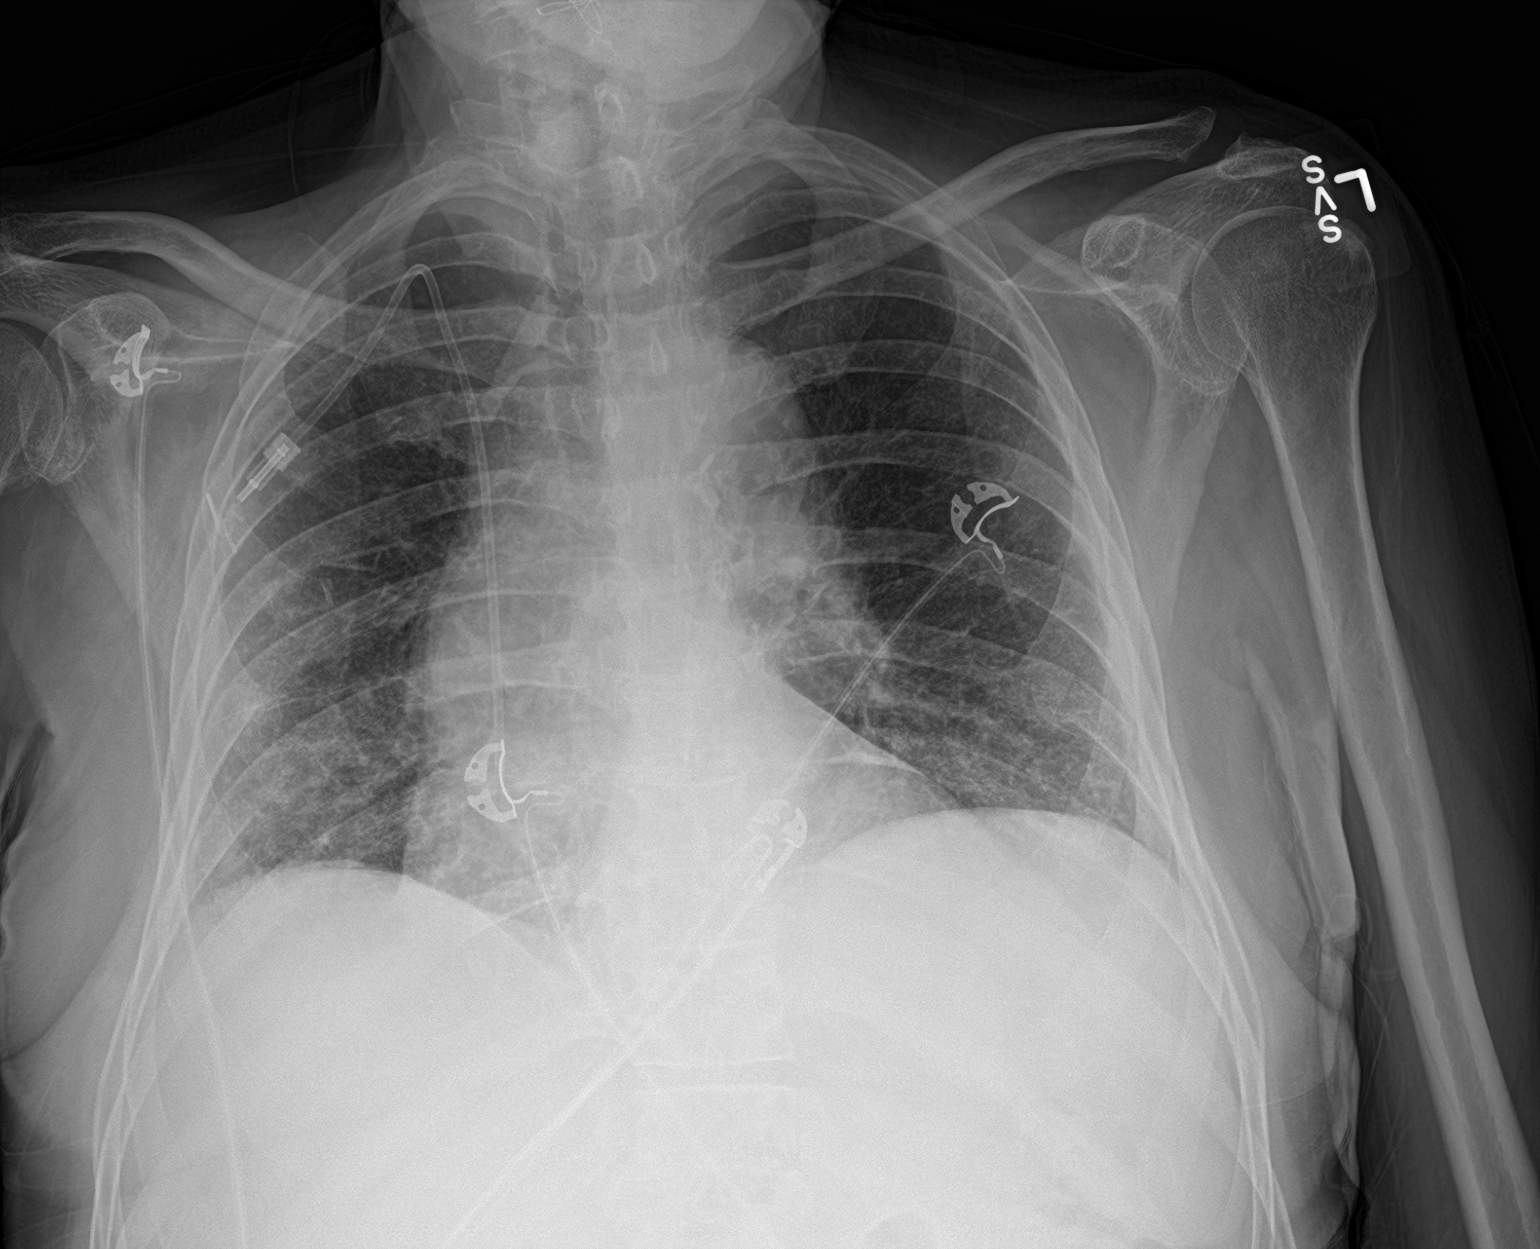

[2 of 2 positions shown; findings below may reference images not displayed]

FINDINGS: Right Port-A-Cath tip high right atrium. Patient rotated right.
Midline trachea. Normal heart size. Tortuous thoracic aorta. No
pleural effusion or pneumothorax. Basilar predominant interstitial
thickening is likely related to interstitial lung disease when
correlated with prior PET. No lobar consolidation. Lateral view
degraded by patient arm position.
IMPRESSION: No evidence of pneumonia.

Basilar predominant interstitial thickening is similar and likely
related to interstitial lung disease.

## 2021-03-20 IMAGING — CT CT HEAD W/O CM
4 series · 17 of 47 positions shown, 19 images · non-contrast
Comparison: [DATE] MRI

CLINICAL DATA: History of known brain metastatic disease with
neuroendocrine carcinoma, this shell encounter

EXAM:
CT HEAD WITHOUT CONTRAST
TECHNIQUE: Contiguous axial images were obtained from the base of the skull
through the vertex without intravenous contrast.

[Series 2: head bone · axial · 0.41mm/px · z∈[+444,+496]mm · 4 of 75 slices shown]
[im 8/75  bone]
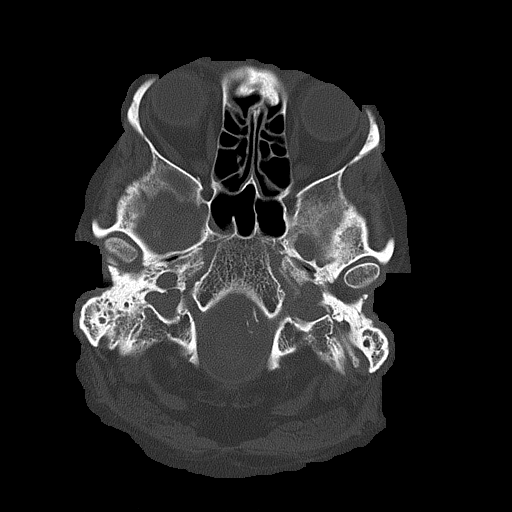
[im 15/75  bone]
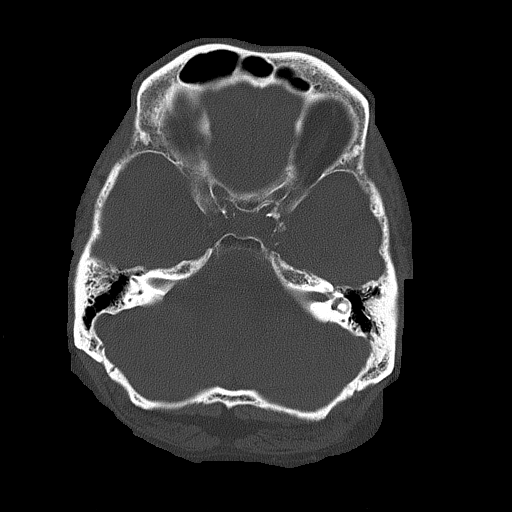
[im 23/75  bone]
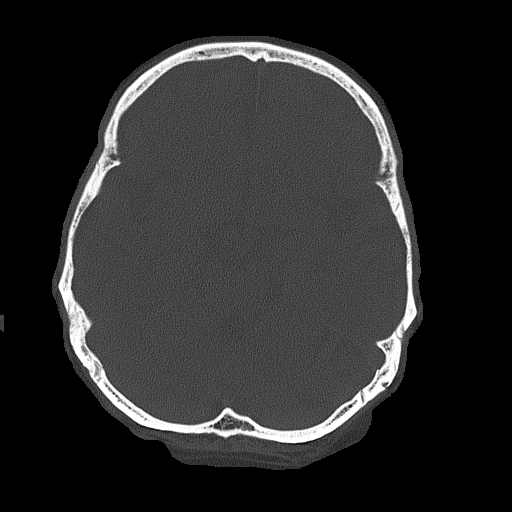
[im 34/75  bone]
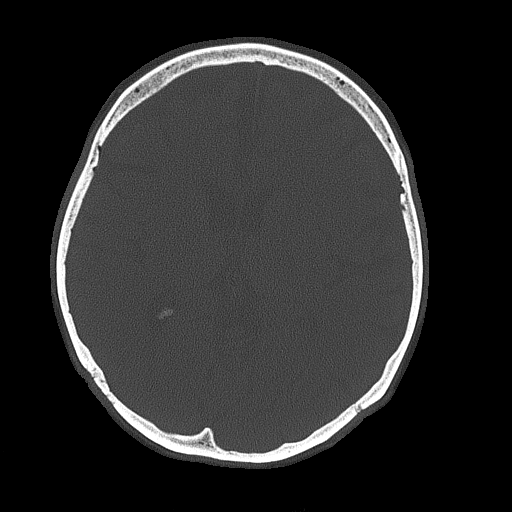

[Series 3: coronal soft tissue · coronal · 0.31mm/px · 3 of 63 slices shown]
[im 21/63  brain]
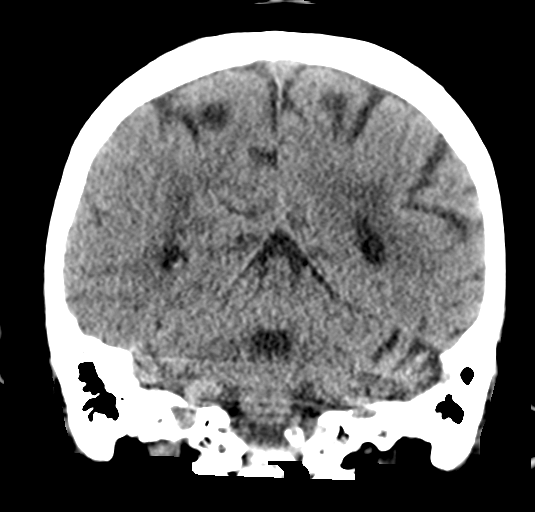
[im 28/63  brain]
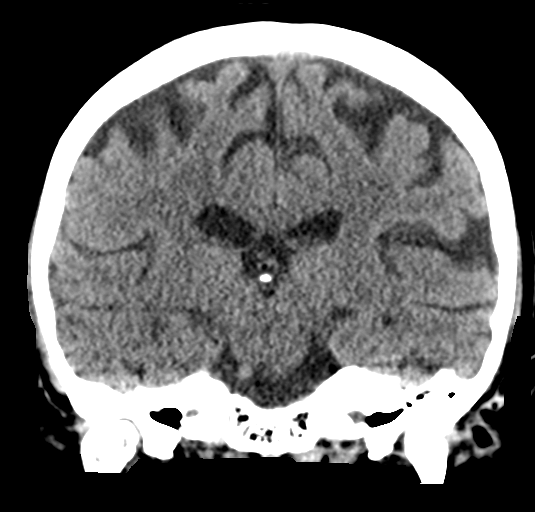
[im 35/63  brain]
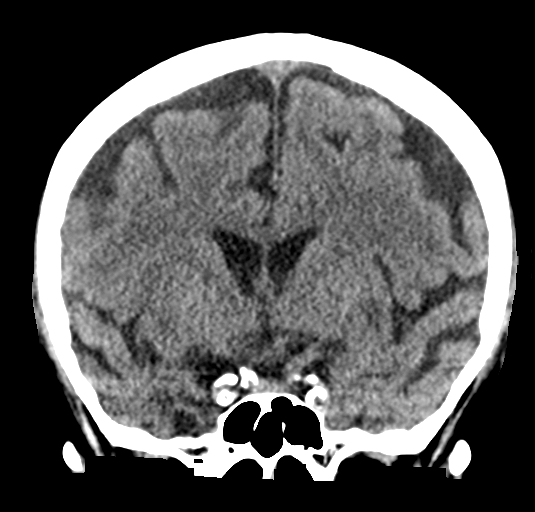

[Series 4: sagittal soft tissue · sagittal · 0.31mm/px · 3 of 56 slices shown]
[im 19/56  brain]
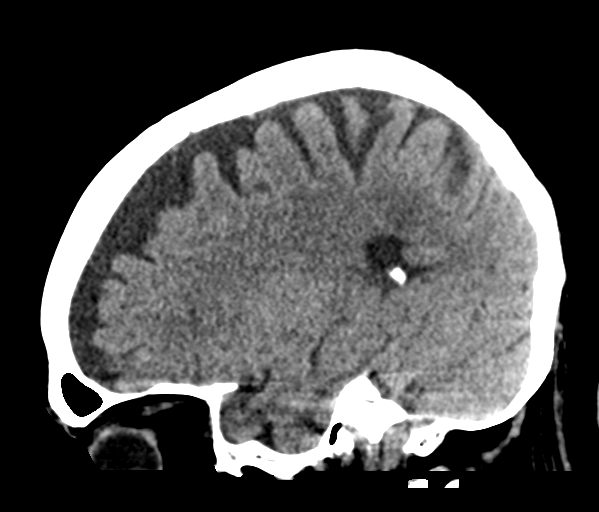
[im 28/56  brain]
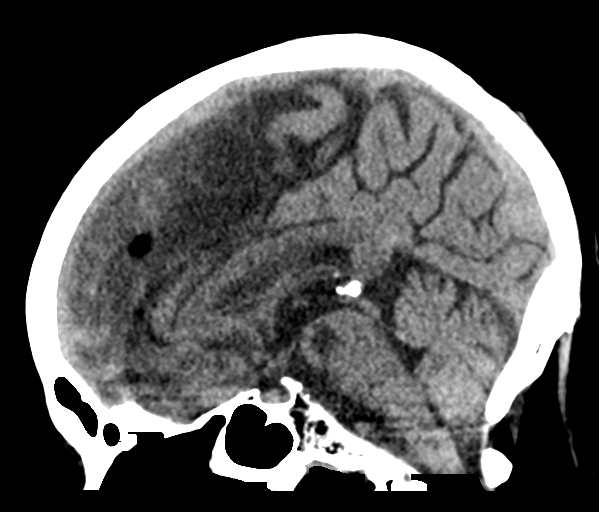
[im 37/56  brain]
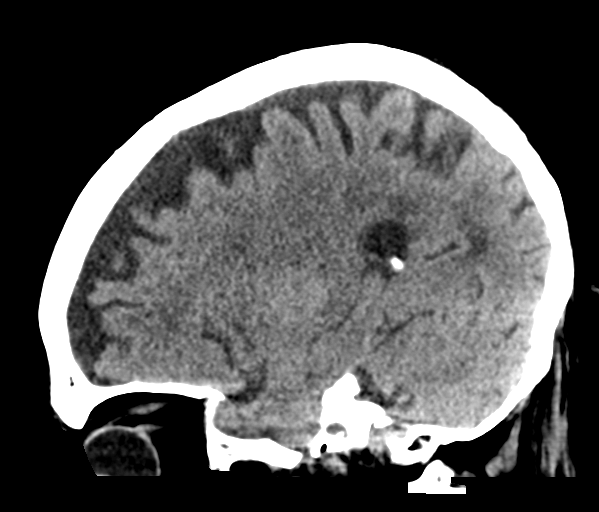

[Series 5: head wo · axial · 0.41mm/px · z∈[+445,+555]mm · 7 of 30 slices shown, 9 images]
[im 4/30  brain]
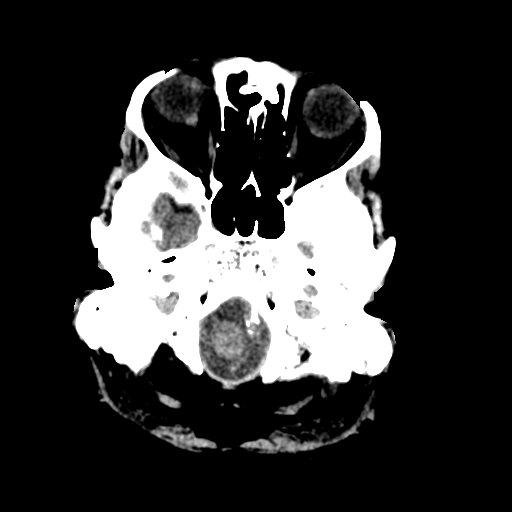
[im 4/30  bone]
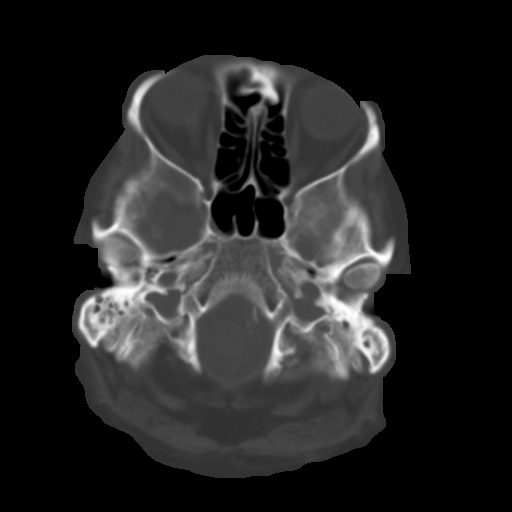
[im 8/30  brain]
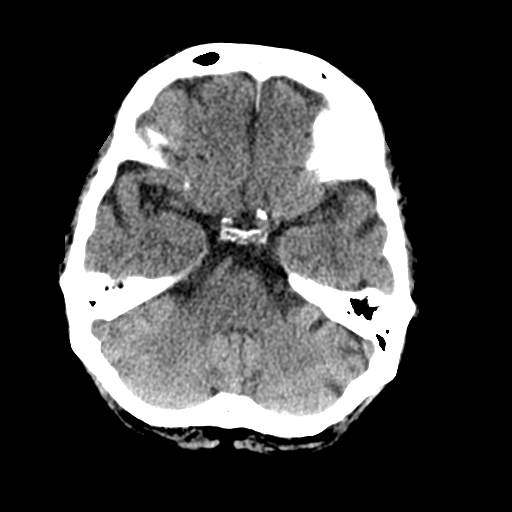
[im 11/30  brain]
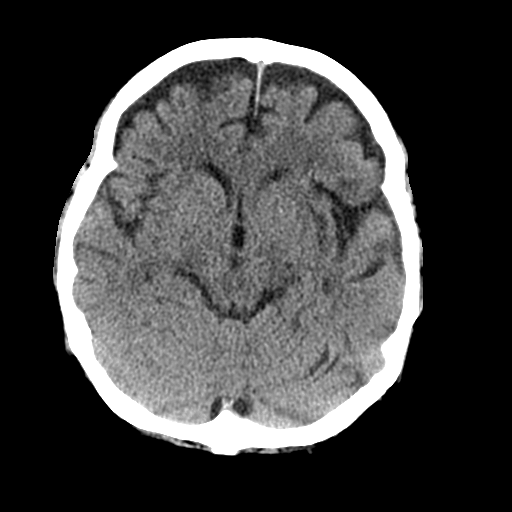
[im 15/30  brain]
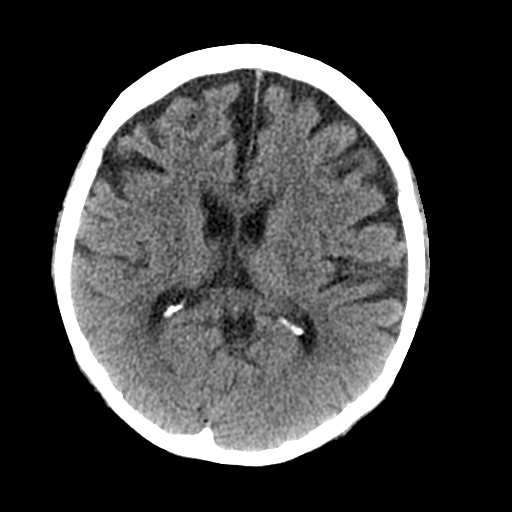
[im 19/30  brain]
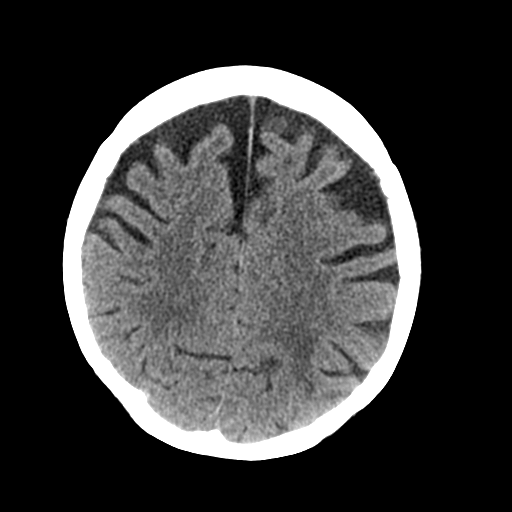
[im 19/30  bone]
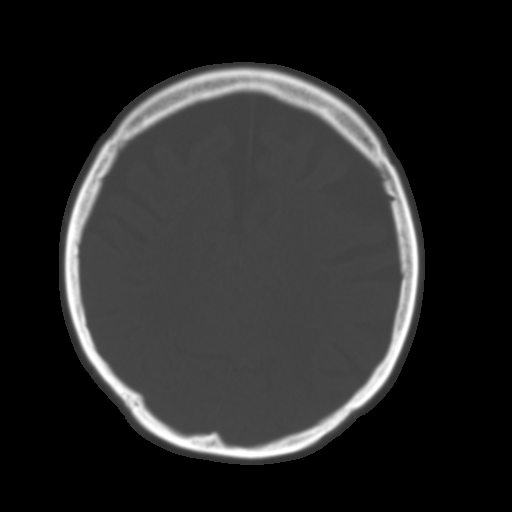
[im 22/30  brain]
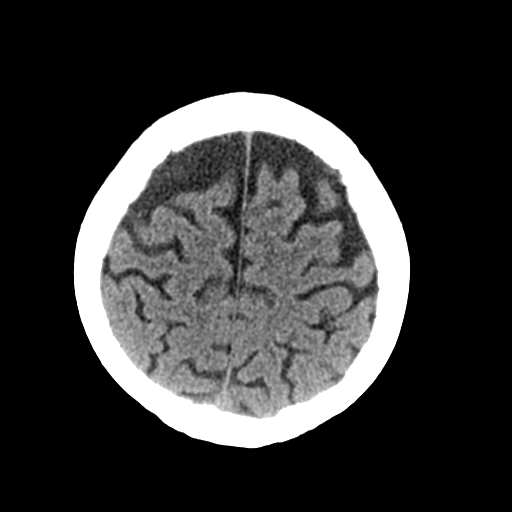
[im 26/30  brain]
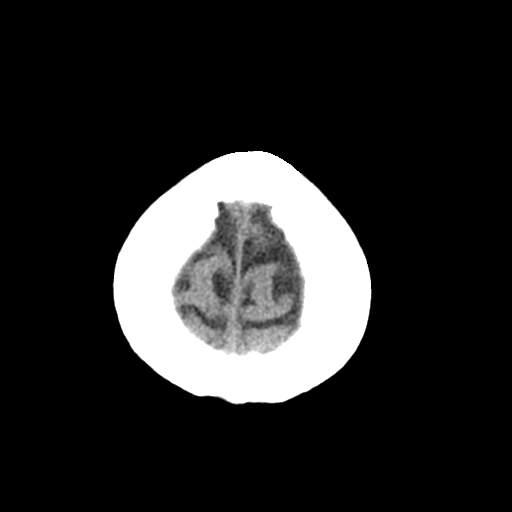

[17 of 47 positions shown; findings below may reference images not displayed]

FINDINGS: Brain: Previously seen metastatic disease is not well appreciated on
this exam. No surrounding white matter edema to suggest persistent
large metastatic disease is noted. Mild atrophic changes are noted.
No findings to suggest acute hemorrhage or acute infarction are
noted.

Vascular: No hyperdense vessel or unexpected calcification.

Skull: Normal. Negative for fracture or focal lesion.

Sinuses/Orbits: No acute finding.

Other: None.
IMPRESSION: Previously seen metastatic disease is not well appreciated on
today's exam. No acute abnormality is noted.

Mild atrophic changes.

## 2021-03-20 IMAGING — MR MR HEAD WO/W CM
14 series · 48 of 48 positions shown · IV contrast (6ml Gadavist)
Comparison: [DATE]

CLINICAL DATA: Progressive weakness for 2 months. History of
high-grade neuroendocrine carcinoma with metastases to the lungs and
brain. Prior whole brain radiation.

EXAM:
MRI HEAD WITHOUT AND WITH CONTRAST
TECHNIQUE: Multiplanar, multiecho pulse sequences of the brain and surrounding
structures were obtained without and with intravenous contrast.
CONTRAST:  6mL GADAVIST GADOBUTROL 1 MMOL/ML IV SOLN

[Series 5: ax dwi_tracew · axial · 3.0mm · 0.65mm/px · z∈[-103,+52]mm · 4 of 48 slices shown]
[im 1/48]
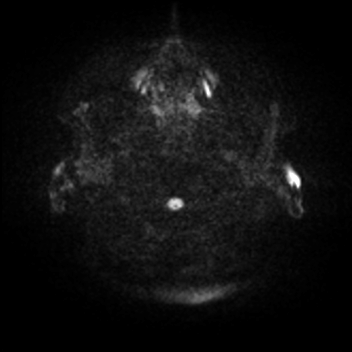
[im 16/48]
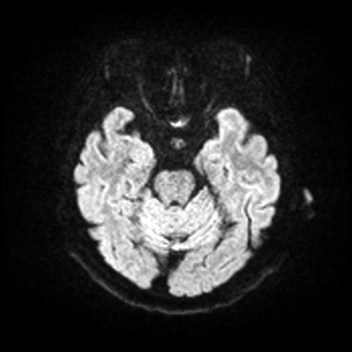
[im 32/48]
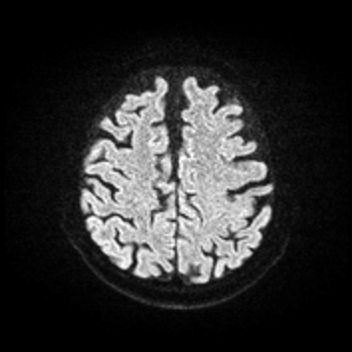
[im 48/48]
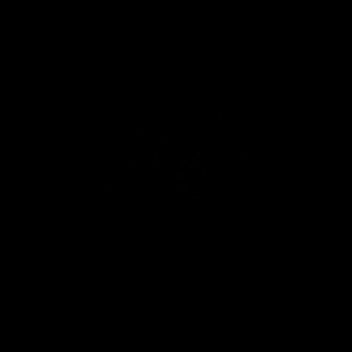

[Series 6: ax dwi_adc · axial · 3.0mm · 0.65mm/px · z∈[-103,+49]mm · 4 of 47 slices shown]
[im 1/47]
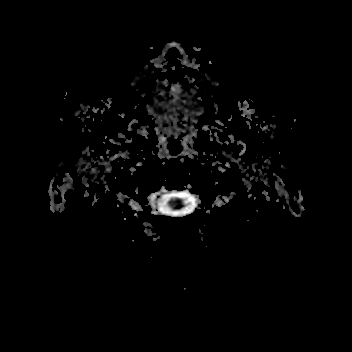
[im 16/47]
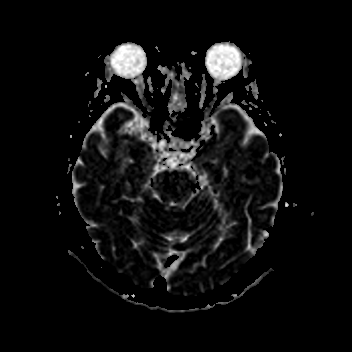
[im 31/47]
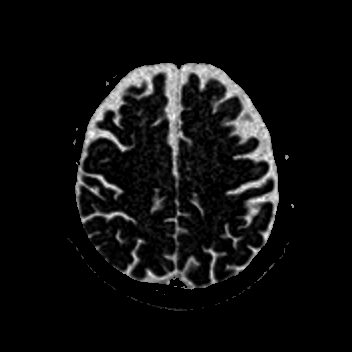
[im 47/47]
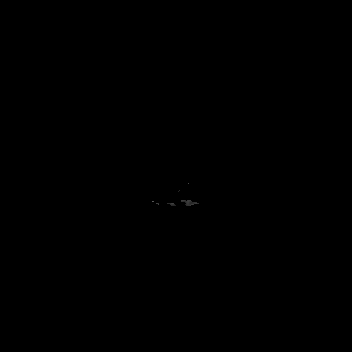

[Series 7: cor dwi_tracew · coronal · 5.0mm · 0.65mm/px · 2 of 39 slices shown]
[im 1/39]
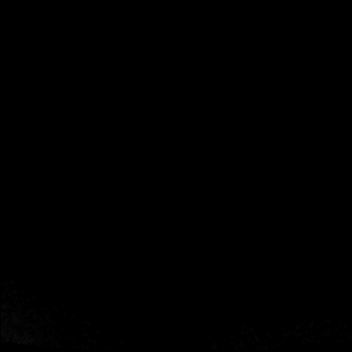
[im 39/39]
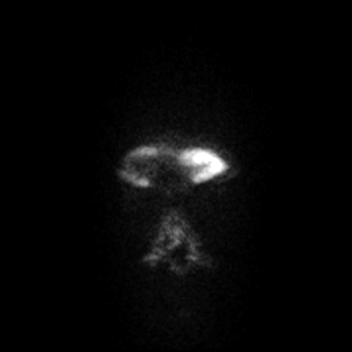

[Series 8: cor dwi_adc · coronal · 5.0mm · 0.65mm/px · 2 of 37 slices shown]
[im 1/37]
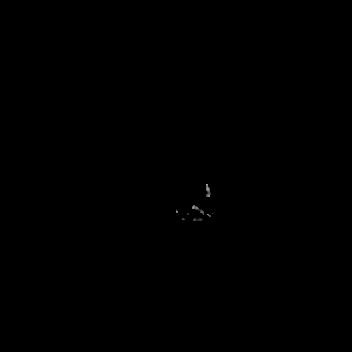
[im 37/37]
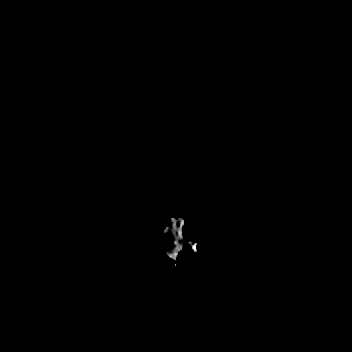

[Series 9: T1 · sagittal · 5.0mm · 0.62mm/px · 1 of 24 slices shown (1 of 2)]
[im 1/24]
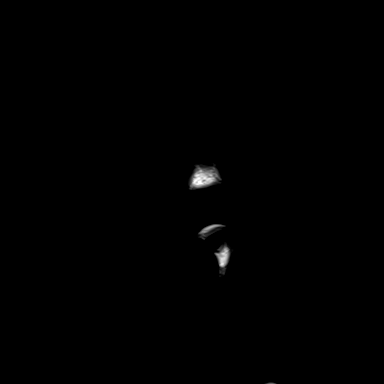

[Series 10: T2 · axial · 5.0mm · 0.53mm/px · 1 of 25 slices shown]
[im 1/25]
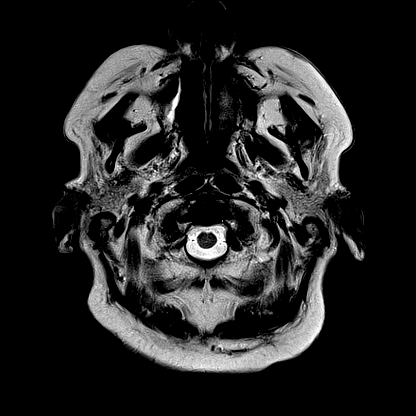

[Series 12: pha_images · axial · 3.0mm · 0.90mm/px · z∈[-110,+61]mm · 3 of 58 slices shown]
[im 1/58]
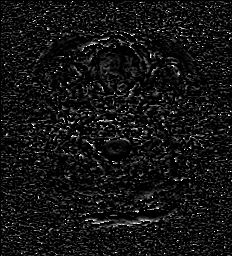
[im 29/58]
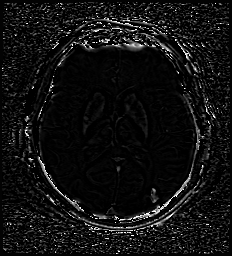
[im 58/58]
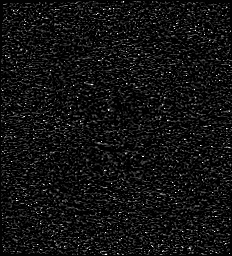

[Series 13: swi_images · axial · 3.0mm · 0.90mm/px · z∈[-113,+64]mm · 3 of 60 slices shown]
[im 1/60]
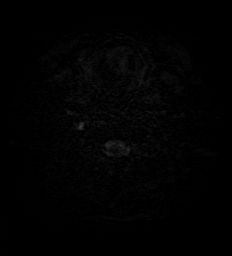
[im 30/60]
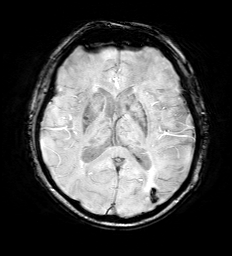
[im 60/60]
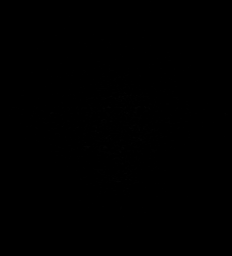

[Series 15: FLAIR · axial · 3.0mm · 0.53mm/px · z∈[-106,+56]mm · 3 of 55 slices shown]
[im 1/55]
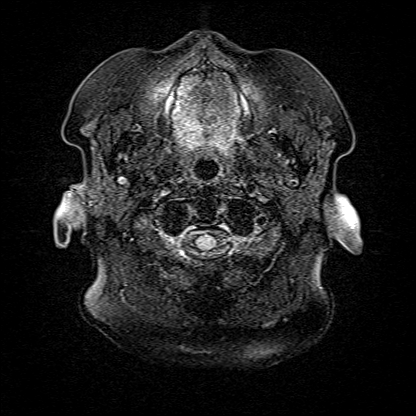
[im 28/55]
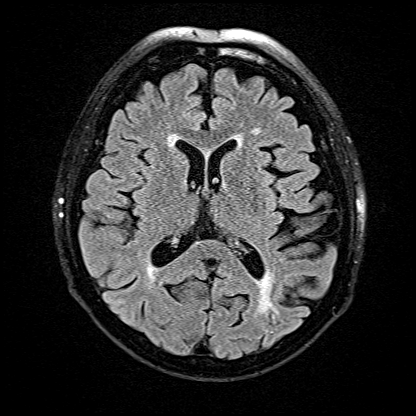
[im 55/55]
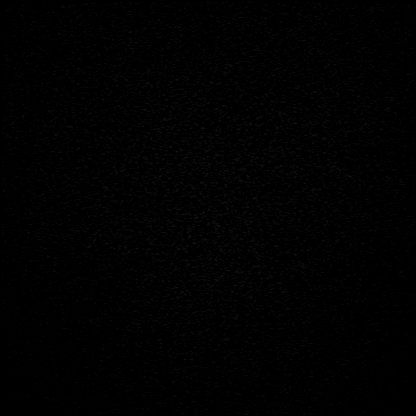

[Series 16: T1 · axial · 1.0mm · 0.98mm/px · z∈[-112,+62]mm · 10 of 176 slices shown (2 of 2)]
[im 1/176]
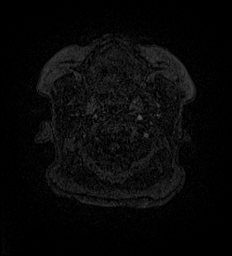
[im 20/176]
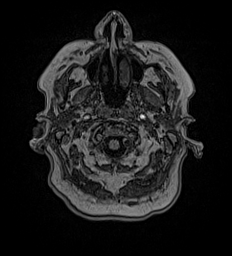
[im 39/176]
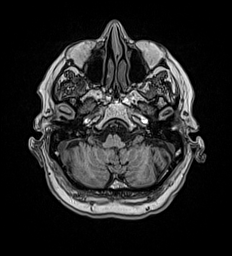
[im 59/176]
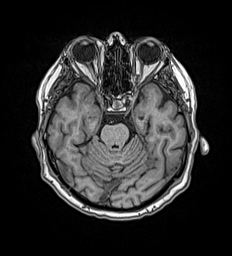
[im 78/176]
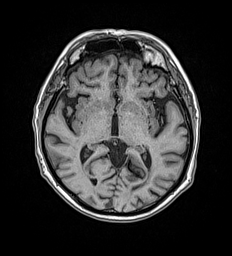
[im 98/176]
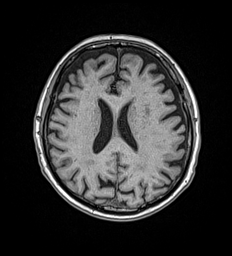
[im 117/176]
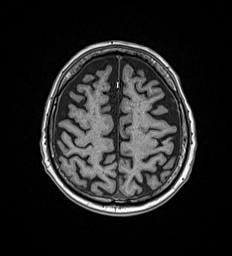
[im 137/176]
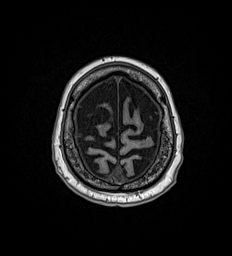
[im 156/176]
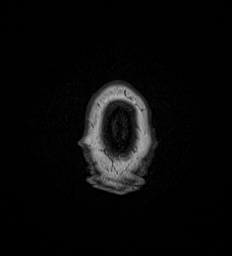
[im 176/176]
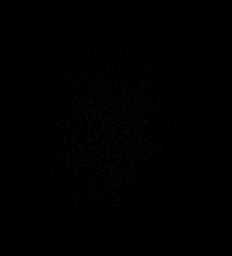

[Series 17: T2 post-contrast · coronal · 5.0mm · 0.57mm/px · 2 of 29 slices shown]
[im 1/29]
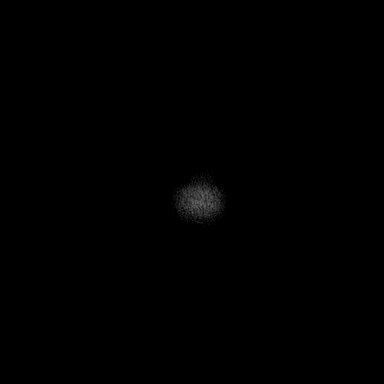
[im 29/29]
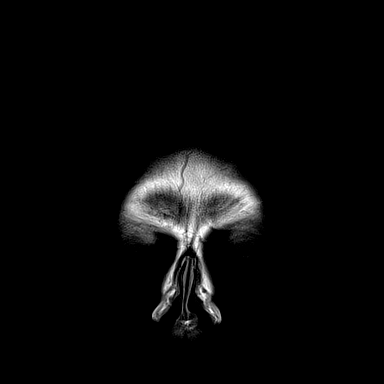

[Series 18: T1 post-contrast · axial · 1.0mm · 0.98mm/px · z∈[-112,+62]mm · 10 of 176 slices shown (1 of 3)]
[im 1/176]
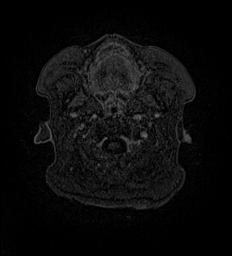
[im 20/176]
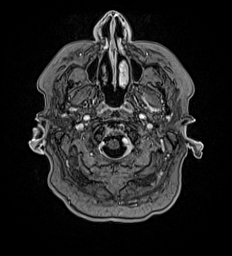
[im 39/176]
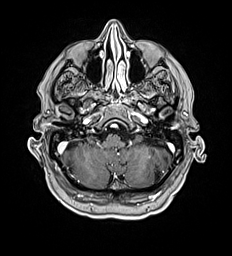
[im 59/176]
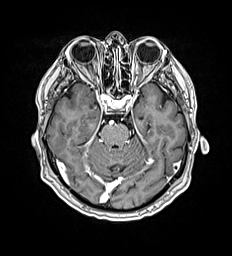
[im 78/176]
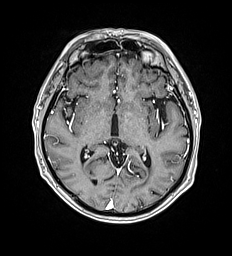
[im 98/176]
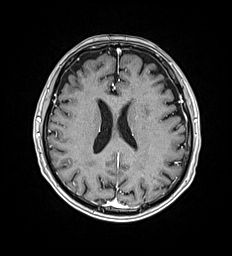
[im 117/176]
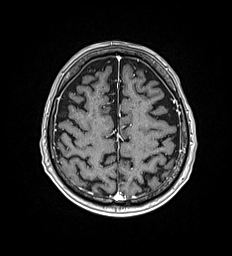
[im 137/176]
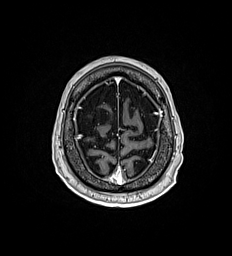
[im 156/176]
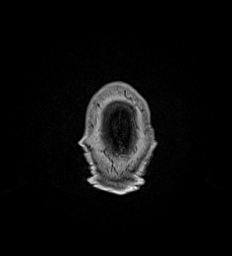
[im 176/176]
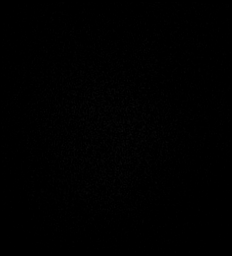

[Series 19: T1 post-contrast · coronal · 5.0mm · 0.57mm/px · 2 of 29 slices shown (2 of 3)]
[im 1/29]
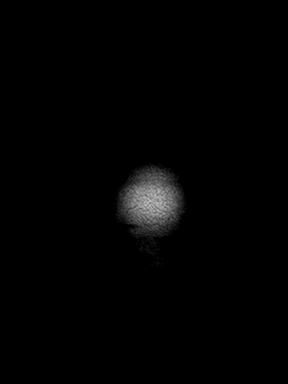
[im 29/29]
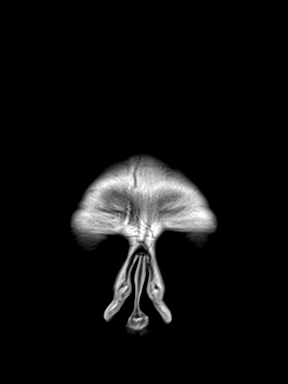

[Series 20: T1 post-contrast · sagittal · 5.0mm · 0.62mm/px · 1 of 25 slices shown (3 of 3)]
[im 1/25]
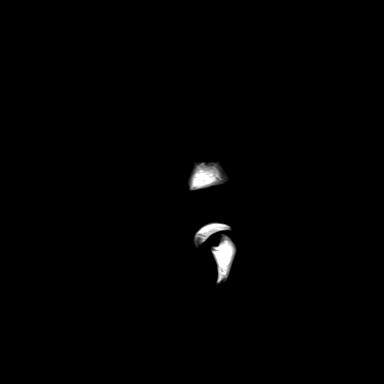

[48 of 48 positions shown; findings below may reference images not displayed]

FINDINGS: Brain: No acute infarct, mass effect or extra-axial collection.
Chronic hemorrhage of the left temporal occipital junction. There is
multifocal hyperintense T2-weighted signal within the white matter.
Parenchymal volume and CSF spaces are normal. The midline structures
are normal. Posterior left temporal lesion has decreased in size
(series 18, image 90), now measuring 90 mm. Inferior left cerebellar
lesion has also decreased in size and now measures 3 mm (image 40).
The other lesions demonstrated on the previous MRI are no longer
visible. There are no new lesions. There is no perilesional edema.

Vascular: Major flow voids are preserved.

Skull and upper cervical spine: Normal calvarium and skull base.
Visualized upper cervical spine and soft tissues are normal.

Sinuses/Orbits:No paranasal sinus fluid levels or advanced mucosal
thickening. No mastoid or middle ear effusion. Normal orbits.
IMPRESSION: 1. Positive response to therapy with decreased size of 2 lesions
located in the posterior left temporal lobe and inferior left
cerebellar hemisphere. The other lesions have resolved. There are no
new lesions.
2. No acute intracranial abnormality.

## 2021-03-20 IMAGING — CT CT ABD-PELV W/O CM
2 of 4 series · 16 of 46 positions shown, 18 images · non-contrast
Comparison: [DATE]

CLINICAL DATA: Known high-grade neuroendocrine carcinoma with
abdominal distension, initial encounter

EXAM:
CT ABDOMEN AND PELVIS WITHOUT CONTRAST
TECHNIQUE: Multidetector CT imaging of the abdomen and pelvis was performed
following the standard protocol without IV contrast.

[Series 2: routine abd/pel wo · axial · 0.70mm/px · z∈[-292,+168]mm · 13 of 101 slices shown, 15 images]
[im 5/101  soft-tissue]
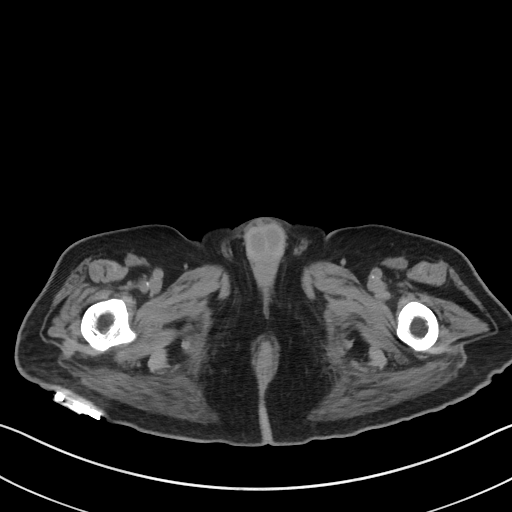
[im 5/101  bone]
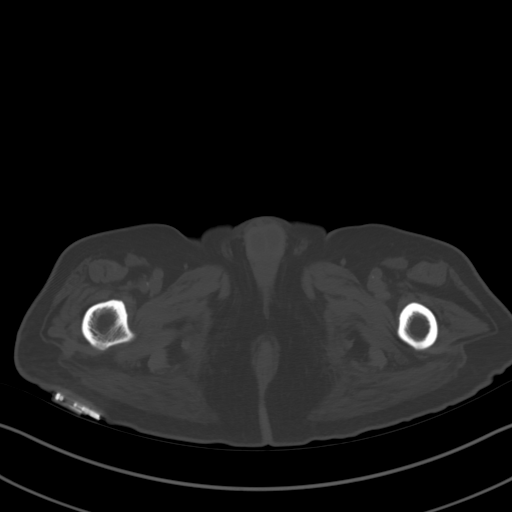
[im 13/101  soft-tissue]
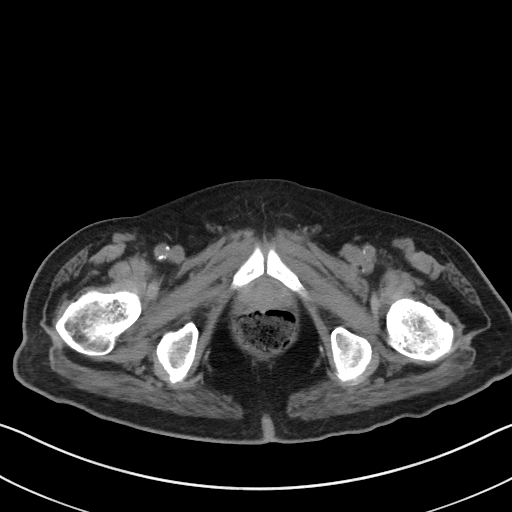
[im 21/101  soft-tissue]
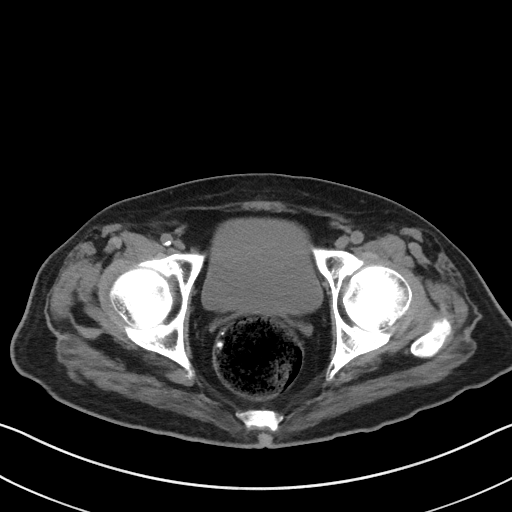
[im 29/101  soft-tissue]
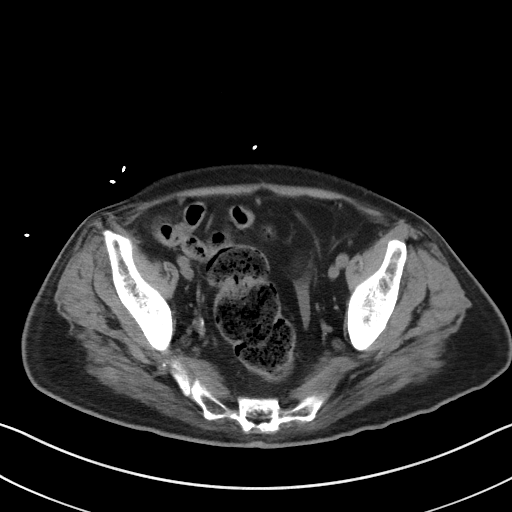
[im 37/101  soft-tissue]
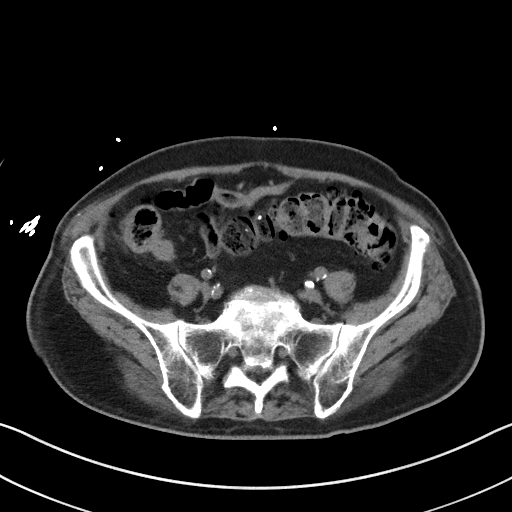
[im 45/101  soft-tissue]
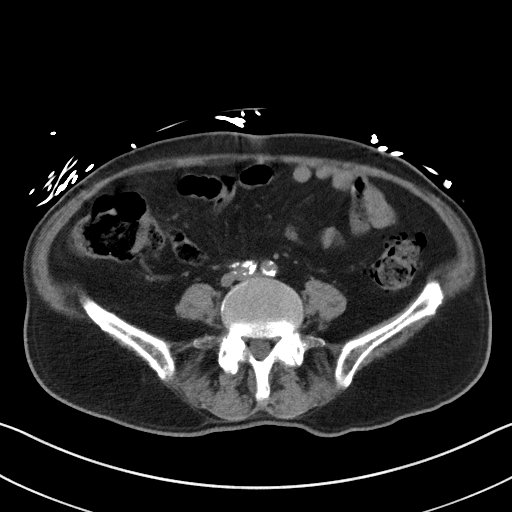
[im 53/101  soft-tissue]
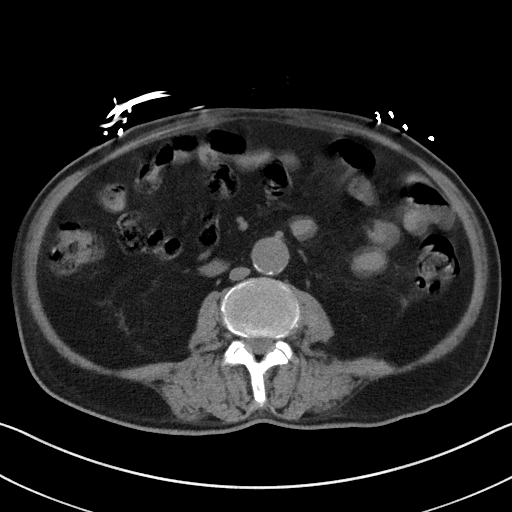
[im 57/101  soft-tissue]
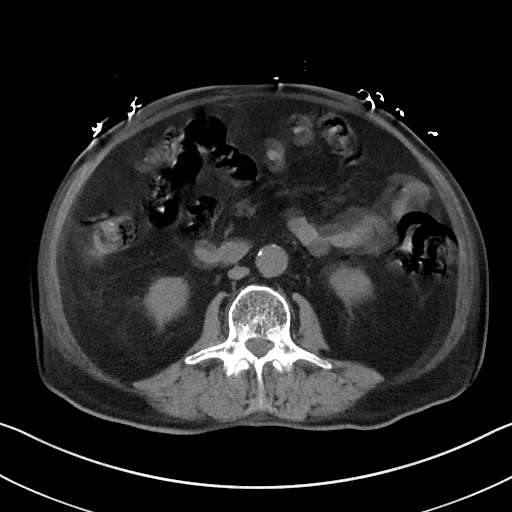
[im 65/101  soft-tissue]
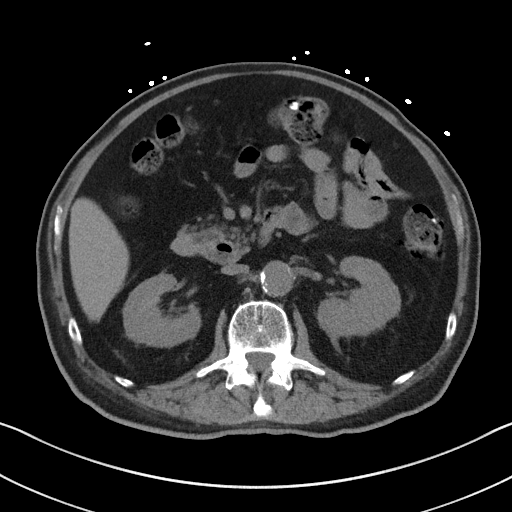
[im 65/101  bone]
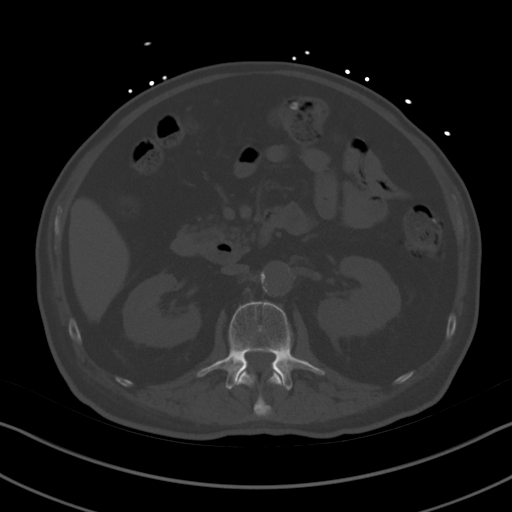
[im 73/101  soft-tissue]
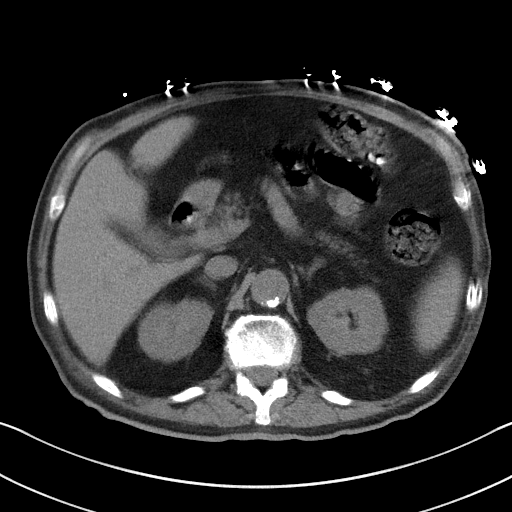
[im 81/101  soft-tissue]
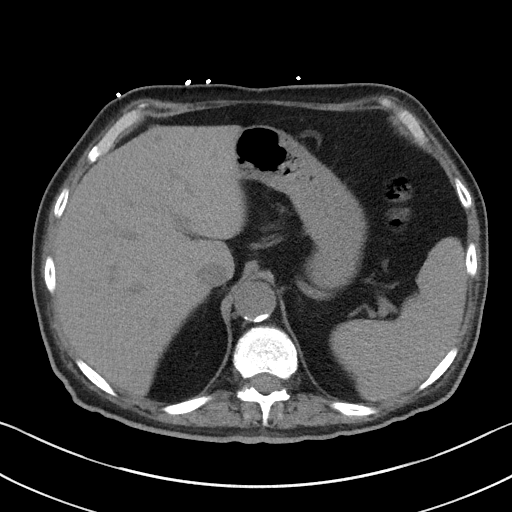
[im 89/101  soft-tissue]
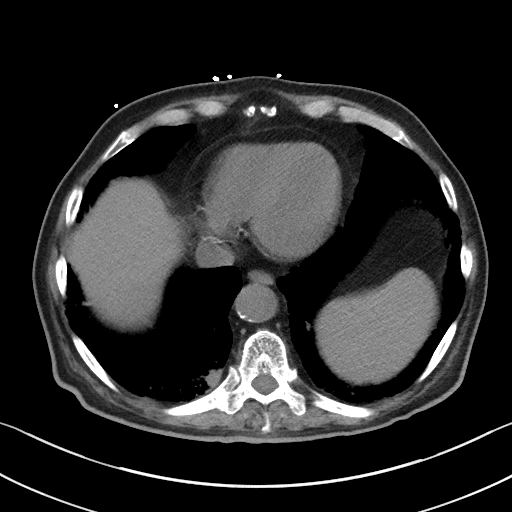
[im 97/101  soft-tissue]
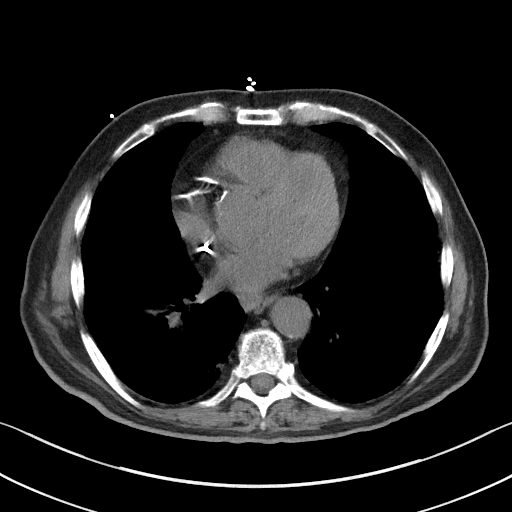

[Series 5: coronal st · coronal · 0.76mm/px · 3 of 94 slices shown]
[im 32/94  soft-tissue]
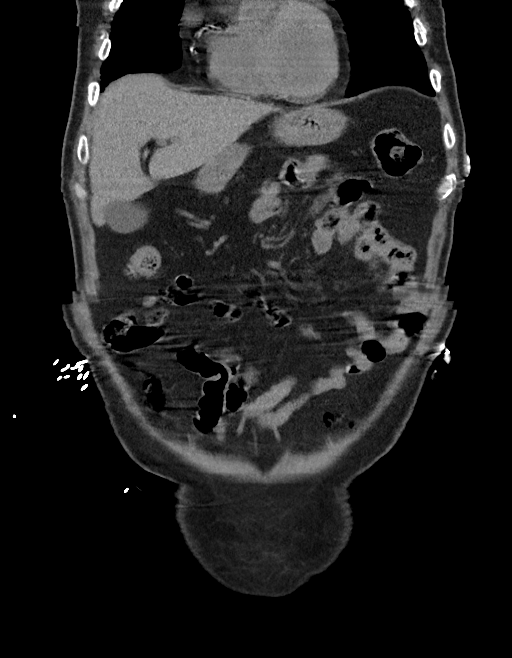
[im 42/94  soft-tissue]
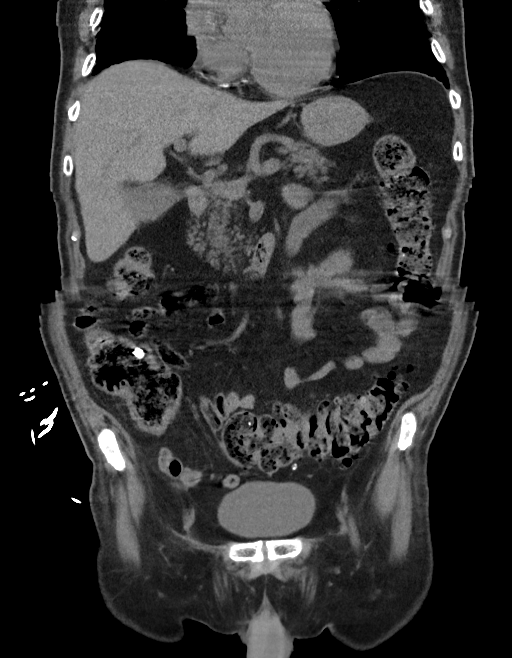
[im 52/94  soft-tissue]
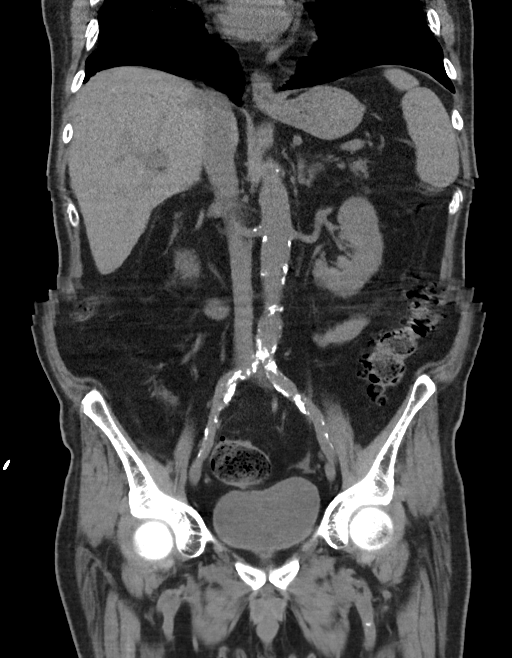

[16 of 46 positions shown; findings below may reference images not displayed]

FINDINGS: Lower chest: Previously seen right lower lobe mass is again
identified but has decreased in size significantly now measuring
approximately 2.4 by 1.3 cm in greatest dimension. It previously
measured 4.5 cm in dimension. No sizable effusion is seen.

Hepatobiliary: No focal liver abnormality is seen. No gallstones,
gallbladder wall thickening, or biliary dilatation.

Pancreas: Unremarkable. No pancreatic ductal dilatation or
surrounding inflammatory changes.

Spleen: Normal in size without focal abnormality.

Adrenals/Urinary Tract: Adrenal glands are within normal limits.
Kidneys are well visualized bilaterally. Tiny 1-2 mm stone is noted
in the midportion of the left kidney. No mass lesion or
hydronephrosis is noted. The bladder is partially distended.

Stomach/Bowel: Scattered diverticular change of the colon is noted.
Mild retained fecal material is seen. No obstructive or inflammatory
changes are noted. The appendix has been surgically removed. Small
bowel and stomach appear within normal limits.

Vascular/Lymphatic: Atherosclerotic calcifications are noted. The
previously seen retrocaval lymph node has decreased in size now
measuring 3-4 mm in short axis decreased from 11 mm on the prior
exam.

Reproductive: Prostate is unremarkable.

Other: No abdominal wall hernia or abnormality. No abdominopelvic
ascites.

Musculoskeletal: No acute or significant osseous findings.
IMPRESSION: Interval reduction in size of right lower lobe mass and retrocaval
lymph nodes seen on prior exam.

Tiny 1-2 mm stone in the left kidney without obstructive change.

Changes of mild diverticulosis without diverticulitis.

## 2021-03-20 MED ORDER — OXYCODONE-ACETAMINOPHEN 7.5-325 MG PO TABS
1.0000 | ORAL_TABLET | ORAL | Status: DC | PRN
Start: 2021-03-20 — End: 2021-03-29
  Administered 2021-03-20 – 2021-03-26 (×7): 1 via ORAL
  Filled 2021-03-20 (×8): qty 1

## 2021-03-20 MED ORDER — PEGFILGRASTIM-JMDB 6 MG/0.6ML ~~LOC~~ SOSY
6.0000 mg | PREFILLED_SYRINGE | Freq: Once | SUBCUTANEOUS | Status: AC
  Administered 2021-03-20: 6 mg via SUBCUTANEOUS
  Filled 2021-03-20: qty 0.6

## 2021-03-20 MED ORDER — SODIUM CHLORIDE 0.9 % IV SOLN
INTRAVENOUS | Status: DC
  Filled 2021-03-20 (×2): qty 250

## 2021-03-20 MED ORDER — ASPIRIN EC 81 MG PO TBEC
81.0000 mg | DELAYED_RELEASE_TABLET | Freq: Every day | ORAL | Status: DC
  Administered 2021-03-21 – 2021-03-23 (×3): 81 mg via ORAL
  Filled 2021-03-20 (×3): qty 1

## 2021-03-20 MED ORDER — IBUPROFEN 400 MG PO TABS: 800.0000 mg | ORAL_TABLET | Freq: Three times a day (TID) | ORAL | Status: DC | PRN

## 2021-03-20 MED ORDER — POTASSIUM CHLORIDE 10 MEQ/100ML IV SOLN
10.0000 meq | Freq: Once | INTRAVENOUS | Status: AC
  Administered 2021-03-20: 10 meq via INTRAVENOUS
  Filled 2021-03-20: qty 100

## 2021-03-20 MED ORDER — ONDANSETRON HCL 4 MG PO TABS
8.0000 mg | ORAL_TABLET | Freq: Two times a day (BID) | ORAL | Status: DC | PRN
  Filled 2021-03-20: qty 2

## 2021-03-20 MED ORDER — PROCHLORPERAZINE MALEATE 10 MG PO TABS
10.0000 mg | ORAL_TABLET | Freq: Four times a day (QID) | ORAL | Status: DC | PRN
  Administered 2021-03-23: 10 mg via ORAL
  Filled 2021-03-20 (×3): qty 1

## 2021-03-20 MED ORDER — KCL IN DEXTROSE-NACL 20-5-0.45 MEQ/L-%-% IV SOLN
INTRAVENOUS | Status: DC
  Filled 2021-03-20 (×6): qty 1000

## 2021-03-20 MED ORDER — PANTOPRAZOLE SODIUM 40 MG PO TBEC
40.0000 mg | DELAYED_RELEASE_TABLET | Freq: Every day | ORAL | Status: DC
  Administered 2021-03-21 – 2021-03-28 (×7): 40 mg via ORAL
  Filled 2021-03-20 (×7): qty 1

## 2021-03-20 MED ORDER — SENNOSIDES-DOCUSATE SODIUM 8.6-50 MG PO TABS
1.0000 | ORAL_TABLET | Freq: Two times a day (BID) | ORAL | Status: DC
  Administered 2021-03-20 – 2021-03-23 (×6): 1 via ORAL
  Filled 2021-03-20 (×6): qty 1

## 2021-03-20 MED ORDER — LACTULOSE 10 GM/15ML PO SOLN: 20.0000 g | Freq: Every day | ORAL | Status: DC | PRN

## 2021-03-20 MED ORDER — DEXAMETHASONE 4 MG PO TABS
4.0000 mg | ORAL_TABLET | Freq: Every day | ORAL | Status: DC
  Administered 2021-03-21 – 2021-03-22 (×2): 4 mg via ORAL
  Filled 2021-03-20 (×2): qty 1

## 2021-03-20 MED ORDER — FINASTERIDE 5 MG PO TABS
5.0000 mg | ORAL_TABLET | ORAL | Status: DC
  Administered 2021-03-21 – 2021-03-28 (×8): 5 mg via ORAL
  Filled 2021-03-20 (×9): qty 1

## 2021-03-20 MED ORDER — SODIUM CHLORIDE 0.9 % IV BOLUS
1000.0000 mL | Freq: Once | INTRAVENOUS | Status: AC
  Administered 2021-03-20: 1000 mL via INTRAVENOUS

## 2021-03-20 MED ORDER — METOPROLOL SUCCINATE ER 50 MG PO TB24
25.0000 mg | ORAL_TABLET | ORAL | Status: DC
  Administered 2021-03-21: 07:00:00 25 mg via ORAL
  Filled 2021-03-20: qty 1

## 2021-03-20 MED ORDER — POTASSIUM CHLORIDE CRYS ER 20 MEQ PO TBCR
40.0000 meq | EXTENDED_RELEASE_TABLET | Freq: Once | ORAL | Status: AC
  Administered 2021-03-20: 40 meq via ORAL
  Filled 2021-03-20: qty 2

## 2021-03-20 MED ORDER — POTASSIUM CHLORIDE IN NACL 20-0.9 MEQ/L-% IV SOLN
Freq: Once | INTRAVENOUS | Status: AC
  Filled 2021-03-20: qty 1000

## 2021-03-20 MED ORDER — LEVETIRACETAM 500 MG PO TABS
500.0000 mg | ORAL_TABLET | Freq: Two times a day (BID) | ORAL | Status: DC
  Administered 2021-03-21 – 2021-03-23 (×6): 500 mg via ORAL
  Filled 2021-03-20 (×10): qty 1

## 2021-03-20 MED ORDER — GADOBUTROL 1 MMOL/ML IV SOLN
6.0000 mL | Freq: Once | INTRAVENOUS | Status: AC | PRN
  Administered 2021-03-20: 6 mL via INTRAVENOUS

## 2021-03-20 NOTE — Progress Notes (Signed)
Pt transferred to ED via wheelchair at 1625. Wife met pt in ED lobby. Right chest port remains accessed.

## 2021-03-20 NOTE — Progress Notes (Signed)
No BM since resolution of diarrhea, approximately 1 week ago. States he feels "sleepy" all the time. Reports numbness and tingling in his toes. Denies nausea or vomiting, but also reports poor intake. Very weak. Per Beckey Rutter, NP, will be sending pt the the ED for further work-up.

## 2021-03-20 NOTE — ED Notes (Signed)
Bladder scan 294mL

## 2021-03-20 NOTE — ED Notes (Signed)
Pt to XRAY
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
## 2021-03-20 NOTE — ED Notes (Signed)
See triage note, pt with family, sent from [place] center for abnormal labs, unable to specify.  C/o generalized weakness. Next treatment on Wednesday.  Pt alert and oriented.  Port accessed on arrival

## 2021-03-20 NOTE — ED Notes (Signed)
Dr Jari Pigg notified of bladder scan results, no orders at this time.  Pt attempted to use urinal, unable
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
## 2021-03-20 NOTE — ED Triage Notes (Signed)
Pt via POV from the cancer center. Pt was at the cancer center and has labs drew. Per wife and pt they told them that he would have to be admitted for his lab values but unable to tell me what labs in particular. Pt c/o generalized weakness. Pt dx with lung cancer and is undergoing active chemo treatment. Pt is A&Ox4 and NAD.
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
## 2021-03-21 DIAGNOSIS — D61818 Other pancytopenia: Secondary | ICD-10-CM

## 2021-03-21 DIAGNOSIS — R627 Adult failure to thrive: Secondary | ICD-10-CM

## 2021-03-21 DIAGNOSIS — C7931 Secondary malignant neoplasm of brain: Secondary | ICD-10-CM

## 2021-03-21 DIAGNOSIS — E876 Hypokalemia: Secondary | ICD-10-CM | POA: Diagnosis not present

## 2021-03-21 DIAGNOSIS — R531 Weakness: Secondary | ICD-10-CM

## 2021-03-21 DIAGNOSIS — C7A1 Malignant poorly differentiated neuroendocrine tumors: Secondary | ICD-10-CM

## 2021-03-21 DIAGNOSIS — E871 Hypo-osmolality and hyponatremia: Secondary | ICD-10-CM

## 2021-03-21 DIAGNOSIS — Z515 Encounter for palliative care: Secondary | ICD-10-CM

## 2021-03-21 DIAGNOSIS — C7A8 Other malignant neuroendocrine tumors: Secondary | ICD-10-CM | POA: Diagnosis not present

## 2021-03-21 DIAGNOSIS — C7B8 Other secondary neuroendocrine tumors: Secondary | ICD-10-CM

## 2021-03-21 LAB — CBC
HCT: 22.3 % — ABNORMAL LOW (ref 39.0–52.0)
Hemoglobin: 8.3 g/dL — ABNORMAL LOW (ref 13.0–17.0)
MCH: 32.3 pg (ref 26.0–34.0)
MCHC: 37.2 g/dL — ABNORMAL HIGH (ref 30.0–36.0)
MCV: 86.8 fL (ref 80.0–100.0)
Platelets: 61 10*3/uL — ABNORMAL LOW (ref 150–400)
RBC: 2.57 MIL/uL — ABNORMAL LOW (ref 4.22–5.81)
RDW: 14 % (ref 11.5–15.5)
WBC: 1.3 10*3/uL — CL (ref 4.0–10.5)
nRBC: 0 % (ref 0.0–0.2)

## 2021-03-21 LAB — BASIC METABOLIC PANEL
Anion gap: 5 (ref 5–15)
BUN: 12 mg/dL (ref 8–23)
CO2: 24 mmol/L (ref 22–32)
Calcium: 7.5 mg/dL — ABNORMAL LOW (ref 8.9–10.3)
Chloride: 105 mmol/L (ref 98–111)
Creatinine, Ser: 0.4 mg/dL — ABNORMAL LOW (ref 0.61–1.24)
GFR, Estimated: >60 mL/min (ref >60)
Glucose, Bld: 196 mg/dL — ABNORMAL HIGH (ref 70–99)
Potassium: 3.2 mmol/L — ABNORMAL LOW (ref 3.5–5.1)
Sodium: 134 mmol/L — ABNORMAL LOW (ref 135–145)

## 2021-03-21 LAB — CK: Total CK: 20 U/L — ABNORMAL LOW (ref 49–397)

## 2021-03-21 MED ORDER — POTASSIUM CHLORIDE CRYS ER 20 MEQ PO TBCR
20.0000 meq | EXTENDED_RELEASE_TABLET | Freq: Once | ORAL | Status: AC
  Administered 2021-03-21: 14:00:00 20 meq via ORAL
  Filled 2021-03-21: qty 1

## 2021-03-21 MED ORDER — MAGNESIUM SULFATE 2 GM/50ML IV SOLN
2.0000 g | Freq: Once | INTRAVENOUS | Status: AC
  Administered 2021-03-21: 14:00:00 2 g via INTRAVENOUS
  Filled 2021-03-21: qty 50

## 2021-03-21 MED ORDER — POLYETHYLENE GLYCOL 3350 17 G PO PACK
17.0000 g | PACK | Freq: Every day | ORAL | Status: DC
  Administered 2021-03-21 – 2021-03-22 (×2): 17 g via ORAL
  Filled 2021-03-21 (×2): qty 1

## 2021-03-21 MED ORDER — LACTULOSE 10 GM/15ML PO SOLN
20.0000 g | Freq: Two times a day (BID) | ORAL | Status: DC
  Administered 2021-03-21 – 2021-03-23 (×4): 20 g via ORAL
  Filled 2021-03-21 (×6): qty 30
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
## 2021-03-21 NOTE — Care Management Obs Status (Signed)
[facility] NOTIFICATION   Patient Details  Name: Travis Marshall MRN: [id number] Date of Birth: 1945-12-18   Medicare Observation Status Notification Given:  Yes    Shelbie Hutching, RN 03/21/2021, 3:48 PM
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
## 2021-03-21 NOTE — Progress Notes (Signed)
Patient ID: Travis Marshall, male   DOB: [birth year], 75 y.o.   MRN: [id number] [facility] PROGRESS NOTE  Travis Marshall HKV:[id number] DOB: 09-Jul-1946 DOA: 03/20/2021 PCP: Baxter Hire, MD  HPI/Subjective:   Objective: Vitals:   03/21/21 1337 03/21/21 1500  BP:  107/68  Pulse:  86  Resp:  18  Temp: 99.1 F (37.3 C) 98.6 F (37 C)  SpO2:  96%    Intake/Output Summary (Last 24 hours) at 03/21/2021 1554 Last data filed at 03/21/2021 1545 Gross per 24 hour  Intake 1300.76 ml  Output 750 ml  Net 550.76 ml   Filed Weights   03/20/21 1648  Weight: 63.5 kg    ROS: Review of Systems  Constitutional: Positive for malaise/fatigue.  Respiratory: Negative for shortness of breath.   Cardiovascular: Negative for chest pain.  Gastrointestinal: Positive for constipation. Negative for abdominal pain, nausea and vomiting.   Exam: Physical Exam HENT:     Head: Normocephalic.     Mouth/Throat:     Pharynx: No oropharyngeal exudate.  Eyes:     General: Lids are normal.     Conjunctiva/sclera: Conjunctivae normal.     Pupils: Pupils are equal, round, and reactive to light.  Cardiovascular:     Rate and Rhythm: Normal rate and regular rhythm.     Heart sounds: Normal heart sounds, S1 normal and S2 normal.  Pulmonary:     Breath sounds: Normal breath sounds. No decreased breath sounds, wheezing, rhonchi or rales.  Abdominal:     Palpations: Abdomen is soft.     Tenderness: There is no abdominal tenderness.  Musculoskeletal:     Right lower leg: No swelling.     Left lower leg: No swelling.  Skin:    General: Skin is warm.     Findings: No rash.  Neurological:     Mental Status: He is alert and oriented to person, place, and time.     Comments: Able to straight leg raise bilaterally       Data Reviewed: Basic Metabolic Panel: Recent Labs  Lab 03/15/21 0845 03/20/21 1514 03/20/21 1708 03/21/21 0530  NA 136 130*  --  134*  K 3.1* 3.0*  --  3.2*  CL 100  98  --  105  CO2 26 23  --  24  GLUCOSE 209* 251*  --  196*  BUN 23 20  --  12  CREATININE 0.51* 0.38*  --  0.40*  CALCIUM 8.2* 7.9*  --  7.5*  MG  --   --  1.7  --    Liver Function Tests: Recent Labs  Lab 03/15/21 0845 03/20/21 1514  AST 19 18  ALT 23 25  ALKPHOS 33* 37*  BILITOT 1.5* 1.8*  PROT 5.0* 5.4*  ALBUMIN 2.9* 3.0*   CBC: Recent Labs  Lab 03/15/21 0845 03/20/21 1514 03/21/21 0530  WBC 4.4 2.3* 1.3*  NEUTROABS 3.1 2.0  --   HGB 11.6* 10.4* 8.3*  HCT 31.8* 27.8* 22.3*  MCV 87.8 86.6 86.8  PLT 100* 78* 61*     Recent Results (from the past 240 hour(s))  Resp Panel by RT-PCR (Flu A&B, Covid) Nasopharyngeal Swab     Status: None   Collection Time: 03/20/21  5:08 PM   Specimen: Nasopharyngeal Swab; Nasopharyngeal(NP) swabs in vial transport medium  Result Value Ref Range Status   SARS Coronavirus 2 by RT PCR NEGATIVE NEGATIVE Final    Comment: (NOTE) SARS-CoV-2 target nucleic acids are NOT DETECTED.  The SARS-CoV-2 RNA is generally detectable in upper respiratory specimens during the acute phase of infection. The lowest concentration of SARS-CoV-2 viral copies this assay can detect is 138 copies/mL. A negative result does not preclude SARS-Cov-2 infection and should not be used as the sole basis for treatment or other patient management decisions. A negative result may occur with  improper specimen collection/handling, submission of specimen other than nasopharyngeal swab, presence of viral mutation(s) within the areas targeted by this assay, and inadequate number of viral copies(<138 copies/mL). A negative result must be combined with clinical observations, patient history, and epidemiological information. The expected result is Negative.  Fact Sheet for Patients:  EntrepreneurPulse.com.au  Fact Sheet for Healthcare Providers:  IncredibleEmployment.be  This test is no t yet approved or cleared by the [place]  FDA and  has been authorized for detection and/or diagnosis of SARS-CoV-2 by FDA under an Emergency Use Authorization (EUA). This EUA will remain  in effect (meaning this test can be used) for the duration of the COVID-19 declaration under Section 564(b)(1) of the Act, 21 U.S.C.section 360bbb-3(b)(1), unless the authorization is terminated  or revoked sooner.       Influenza A by PCR NEGATIVE NEGATIVE Final   Influenza B by PCR NEGATIVE NEGATIVE Final    Comment: (NOTE) The Xpert Xpress SARS-CoV-2/FLU/RSV plus assay is intended as an aid in the diagnosis of influenza from Nasopharyngeal swab specimens and should not be used as a sole basis for treatment. Nasal washings and aspirates are unacceptable for Xpert Xpress SARS-CoV-2/FLU/RSV testing.  Fact Sheet for Patients: EntrepreneurPulse.com.au  Fact Sheet for Healthcare Providers: IncredibleEmployment.be  This test is not yet approved or cleared by the [place] FDA and has been authorized for detection and/or diagnosis of SARS-CoV-2 by FDA under an Emergency Use Authorization (EUA). This EUA will remain in effect (meaning this test can be used) for the duration of the COVID-19 declaration under Section 564(b)(1) of the Act, 21 U.S.C. section 360bbb-3(b)(1), unless the authorization is terminated or revoked.  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 COMPARISON:  01/23/2021 FINDINGS: Lower chest: Previously seen right lower lobe mass is again identified but has decreased in size significantly now measuring  approximately 2.4 by 1.3 cm in greatest dimension. It previously measured 4.5 cm in dimension. No sizable effusion is seen. Hepatobiliary: No focal liver abnormality is seen. No gallstones, gallbladder wall thickening, or biliary dilatation. Pancreas: Unremarkable. No pancreatic ductal dilatation or surrounding inflammatory changes. Spleen: Normal in size without focal abnormality. Adrenals/Urinary Tract: Adrenal glands are within normal limits. Kidneys are well visualized bilaterally. Tiny 1-2 mm stone is noted in the midportion of the left kidney. No mass lesion or hydronephrosis is noted. The bladder is partially distended. Stomach/Bowel: Scattered diverticular change of the colon is noted. Mild retained fecal material is seen. No obstructive or inflammatory changes are noted. The appendix has been surgically removed. Small bowel and stomach appear within normal limits. Vascular/Lymphatic: Atherosclerotic calcifications are noted. The previously seen retrocaval lymph node has decreased in size now measuring 3-4 mm in short axis decreased from 11 mm on the prior exam. Reproductive: Prostate is unremarkable. Other: No abdominal wall hernia  or abnormality. No abdominopelvic ascites. Musculoskeletal: No acute or significant osseous findings. IMPRESSION: Interval reduction in size of right lower lobe mass and retrocaval lymph nodes seen on prior exam. Tiny 1-2 mm stone in the left kidney without obstructive change. Changes of mild diverticulosis without diverticulitis. Electronically Signed   By: Inez Catalina M.D.   On: 03/20/2021 19:08   DG Chest 2 View  Result Date: 03/20/2021 CLINICAL DATA:  Possible sepsis. Weakness. Neuroendocrine carcinoma. EXAM: CHEST - 2 VIEW COMPARISON:  01/10/2021 chest radiograph. A PET of 01/23/2021 is also reviewed. FINDINGS: Right Port-A-Cath tip high right atrium. Patient rotated right. Midline trachea. Normal heart size. Tortuous thoracic aorta. No pleural effusion or pneumothorax.  Basilar predominant interstitial thickening is likely related to interstitial lung disease when correlated with prior PET. No lobar consolidation. Lateral view degraded by patient arm position. IMPRESSION: No evidence of pneumonia. 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 Electronically Signed   By: Abigail Miyamoto M.D.   On: 03/20/2021 18:55   CT Head Wo Contrast  Result Date: 03/20/2021 CLINICAL DATA:  History of known brain metastatic disease with neuroendocrine carcinoma, this shell encounter EXAM: CT HEAD WITHOUT CONTRAST TECHNIQUE: Contiguous axial images were obtained from the base of the skull through the vertex without intravenous contrast. COMPARISON:  01/20/2021 MRI FINDINGS: Brain: Previously seen metastatic disease is not well appreciated on this exam. No surrounding white matter edema to suggest persistent large metastatic disease is noted. Mild atrophic changes are noted. No findings to suggest acute hemorrhage or acute infarction are noted. Vascular: No hyperdense vessel or unexpected calcification. Skull: Normal. Negative for fracture or focal lesion. Sinuses/Orbits: No acute finding. Other: None. IMPRESSION: Previously seen metastatic disease is not well appreciated on today's exam. No acute abnormality is noted. Mild atrophic changes. Electronically Signed   By: Inez Catalina M.D.   On: 03/20/2021 19:10   MR Brain W and Wo Contrast  Result Date: 03/20/2021 CLINICAL DATA:  Progressive weakness for 2 months. 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 Prior whole brain radiation. EXAM: MRI HEAD WITHOUT AND WITH CONTRAST TECHNIQUE: Multiplanar, multiecho pulse sequences of the brain and surrounding structures were obtained without and with intravenous contrast. CONTRAST:  26mL GADAVIST GADOBUTROL 1 MMOL/ML IV SOLN COMPARISON:  01/20/2021 FINDINGS: Brain: No acute infarct, mass effect or  extra-axial collection. 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 There is multifocal hyperintense T2-weighted signal within the white matter. Parenchymal volume and CSF spaces are normal. The midline structures are normal. Posterior left temporal lesion has decreased in size (series 18, image 90), now measuring 90 mm. Inferior left cerebellar lesion has also decreased in size and now measures 3 mm (image 40). The other lesions demonstrated on the previous MRI are no longer visible. There are no new lesions. There is no perilesional edema. Vascular: Major flow voids are preserved. Skull and upper cervical spine: Normal calvarium and skull base. Visualized upper cervical spine and soft tissues are normal. Sinuses/Orbits:No paranasal sinus fluid levels or advanced mucosal thickening. No mastoid or middle ear effusion. Normal orbits. IMPRESSION: 1. Positive response to therapy with decreased size of 2 lesions located in the posterior left temporal lobe and inferior left cerebellar hemisphere. The other lesions have resolved. There are no new lesions. 2. No acute intracranial abnormality. Electronically Signed   By: Ulyses Jarred M.D.   On: 03/20/2021 23:15    Scheduled Meds: . aspirin EC  81  mg Oral Daily  . dexamethasone  4 mg Oral Daily  . finasteride  5 mg Oral [id number]  . lactulose  20 g Oral BID  . levETIRAcetam  500 mg Oral BID  . pantoprazole  40 mg Oral QAC breakfast  . polyethylene glycol  17 g Oral Daily  . senna-docusate  1 tablet Oral BID   Continuous Infusions: . dextrose 5 % and 0.45 % NaCl with KCl 20 mEq/L 75 mL/hr at 03/21/21 1545   Brief history.  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  Followed by oncology.  Coming in with diffuse weakness and unable to get off the commode.  Wife states that she cannot take care of him at home.  Patient also has electrolyte abnormalities and severe constipation. Assessment/Plan:  1. Diffuse weakness.  Unable to  get up off the commode.  Patient's wife states that she cannot take care of him at home.  Physical therapy evaluation.  Discontinue pravastatin and check a CPK.  IV fluid hydration. 2. Hypomagnesemia replace magnesium 2 g IV daily. 3. Hypokalemia replace potassium orally. 4. 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  Appreciate oncology consultation and palliative care consultation.  Patient made a DO NOT RESUSCITATE.  Empiric Keppra for brain mets. 5. Severe constipation start MiraLAX and lactulose daily 6. Failure to thrive 7. Pancytopenia.  Likely from chemo treatments 8. Hyponatremia improved only one-point lower than normal range.      Code Status:     Code Status Orders  (From admission, onward)         Start     Ordered   03/21/21 1034  Do not attempt resuscitation (DNR)  Continuous       Question Answer Comment  In the event of cardiac or respiratory ARREST Do not call a "code blue"   In the event of cardiac or respiratory ARREST Do not perform Intubation, CPR, defibrillation or ACLS   In the event of cardiac or respiratory ARREST Use medication by any route, position, wound care, and other measures to relive pain and suffering. May use oxygen, suction and manual treatment of airway obstruction as needed for comfort.      03/21/21 1033        Code Status History    Date Active Date Inactive Code Status Order ID Comments User Context   03/20/2021 2134 03/21/2021 1033 Full Code [id number]  Acheampong, Warnell Bureau, MD ED   Advance Care Planning Activity     Family Communication: Spoke with wife on the phone Disposition Plan: Status is: Observation  Dispo: The patient is from: Home              Anticipated d/c is to: Rehab with palliative care              Patient currently replacing electrolytes and giving IV fluids.  Physical therapy for diffuse weakness.  Add on a CPK and discontinue statin.   Difficult to place patient.  Hopefully not  Consultants:  Palliative  care  Oncology  Time spent: 28 minutes  [place]

## 2021-03-21 NOTE — NC FL2 (Signed)
[place] LEVEL OF CARE SCREENING TOOL     IDENTIFICATION  Patient Name: Travis Marshall Birthdate: 03-07-1946 Sex: male Admission Date (Current Location): 03/20/2021  [place] and [place] Number:  Engineering geologist and Address:  [facility], [address] [place], [place] [address]      Provider Number: [id number]  Attending Physician Name and Address:  Loletha Grayer, MD  Relative Name and Phone Number:  Travis Marshall (wife)    Current Level of Care: Hospital Recommended Level of Care: [place] Prior Approval Number:    Date Approved/Denied:   PASRR Number: [id number]  Discharge Plan: SNF    Current Diagnoses: Patient Active Problem List   Diagnosis Date Noted  . Palliative care encounter   . Acute hyponatremia 03/20/2021  . Goals of care, counseling/discussion 02/08/2021  . Former smoker 01/10/2021  . Encounter for antineoplastic chemotherapy 01/10/2021  . Metastatic lung carcinoma, left ([place]) 01/09/2021    Orientation RESPIRATION BLADDER Height & Weight     Self,Time,Situation,Place  Normal Continent Weight: 63.5 kg Height:  5\' 7"  (170.2 cm)  BEHAVIORAL SYMPTOMS/MOOD NEUROLOGICAL BOWEL NUTRITION STATUS      Continent Diet (heart healthy)  AMBULATORY STATUS COMMUNICATION OF NEEDS Skin   Limited Assist Verbally Normal                       Personal Care Assistance Level of Assistance  Bathing,Feeding,Dressing Bathing Assistance: Limited assistance Feeding assistance: Independent Dressing Assistance: Limited assistance     Functional Limitations Info             SPECIAL CARE FACTORS FREQUENCY  PT (By licensed PT),OT (By licensed OT)     PT Frequency: 5 times per week OT Frequency: 5 times per week            Contractures Contractures Info: Not present    Additional Factors Info  Code Status,Allergies Code Status Info: DNR Allergies Info: Ace inhibitors,  atorvastatin           Current Medications (03/21/2021):  This is the current hospital active medication list Current Facility-Administered Medications  Medication Dose Route Frequency Provider Last Rate Last Admin  . aspirin EC tablet 81 mg  81 mg Oral Daily Acheampong, Warnell Bureau, MD   81 mg at 03/21/21 0827  . dexamethasone (DECADRON) tablet 4 mg  4 mg Oral Daily Acheampong, Warnell Bureau, MD   4 mg at 03/21/21 0827  . dextrose 5 % and 0.45 % NaCl with KCl 20 mEq/L infusion   Intravenous Continuous Artist Beach, MD 75 mL/hr at 03/21/21 1545 Infusion Verify at 03/21/21 1545  . finasteride (PROSCAR) tablet 5 mg  5 mg Oral Karma Ganja, Warnell Bureau, MD   5 mg at 03/21/21 0631  . ibuprofen (ADVIL) tablet 800 mg  800 mg Oral Q8H PRN Acheampong, Warnell Bureau, MD      . lactulose (CHRONULAC) 10 GM/15ML solution 20 g  20 g Oral Daily PRN Artist Beach, MD      . levETIRAcetam (KEPPRA) tablet 500 mg  500 mg Oral BID Artist Beach, MD   500 mg at 03/21/21 0827  . ondansetron (ZOFRAN) tablet 8 mg  8 mg Oral BID PRN Acheampong, Warnell Bureau, MD      . oxyCODONE-acetaminophen (PERCOCET) 7.5-325 MG per tablet 1 tablet  1 tablet Oral Q4H PRN Acheampong, Warnell Bureau, MD   1 tablet at 03/21/21 1538  . pantoprazole (PROTONIX) EC tablet  40 mg  40 mg Oral QAC breakfast Acheampong, Warnell Bureau, MD   40 mg at 03/21/21 4136  . prochlorperazine (COMPAZINE) tablet 10 mg  10 mg Oral Q6H PRN Acheampong, Warnell Bureau, MD      . senna-docusate (Senokot-S) tablet 1 tablet  1 tablet Oral BID Acheampong, Warnell Bureau, MD   1 tablet at 03/21/21 0827     Discharge Medications: Please see discharge summary for a list of discharge medications.  Relevant Imaging Results:  Relevant Lab Results:   Additional Information SS# [id number]  Shelbie Hutching, RN

## 2021-03-21 NOTE — TOC Initial Note (Signed)
Transition of Care [facility]) - Initial/Assessment Note    Patient Details  Name: Travis Marshall MRN: [id number] Date of Birth: February 23, 1946  Transition of Care [facility]) CM/SW Contact:    Shelbie Hutching, RN Phone Number: 03/21/2021, 3:34 PM  Clinical Narrative:                 Patient placed under observation for hyponatremia and metastatic lung cancer.  Patient is currently sleeping so as not disturb patient RNCM called patient's wife, Enid Derry.  Patient is from home with his wife, she reports that for about the past 2 weeks he has been mostly laying in the bed.  He is able to get up and to the bathroom.  Enid Derry would like for the patient to go for short term rehab so he can get stronger.  Josh Borders with palliative spoke with patient and wife and patient would like to continue to get treatment for his cancer but agrees with outpatient palliative.  Kieth Brightly with [facility] has received OP Palliative referral.  PT and OT have been ordered.  RNCM will start bed search for SNF.    Expected Discharge Plan: Skilled Nursing Facility Barriers to Discharge: Continued Medical Work up   Patient Goals and CMS Choice Patient states their goals for this hospitalization and ongoing recovery are:: Wie would like for patient to go to SNF for short term rehab to get stronger CMS Medicare.gov Compare Post Acute Care list provided to:: Patient Choice offered to / list presented to : Patient  Expected Discharge Plan and Services Expected Discharge Plan: [place]   Discharge Planning Services: CM Consult Post Acute Care Choice: [place] Living arrangements for the past 2 months: Single Family Home                 DME Arranged: N/A DME Agency: NA       HH Arranged: NA          Prior Living Arrangements/Services Living arrangements for the past 2 months: Single Family Home Lives with:: Spouse Patient language and need for interpreter reviewed:: Yes        Need for  Family Participation in Patient Care: Yes (Comment) (neuroendocrine cancer) Care giver support system in place?: Yes (comment) (wife) Current home services: DME (walker, shower seat, 3 in 1) Criminal Activity/Legal Involvement Pertinent to Current Situation/Hospitalization: No - Comment as needed  Activities of Daily Living Home Assistive Devices/Equipment: Walker (specify type) ADL Screening (condition at time of admission) Patient's cognitive ability adequate to safely complete daily activities?: Yes Is the patient deaf or have difficulty hearing?: No Does the patient have difficulty seeing, even when wearing glasses/contacts?: No Does the patient have difficulty concentrating, remembering, or making decisions?: No Patient able to express need for assistance with ADLs?: Yes Does the patient have difficulty dressing or bathing?: Yes Independently performs ADLs?: Yes (appropriate for developmental age) Does the patient have difficulty walking or climbing stairs?: Yes Weakness of Legs: Both Weakness of Arms/Hands: None  Permission Sought/Granted Permission sought to share information with : Case Manager,Facility Contact Representative,Family Supports Permission granted to share information with : Yes, Verbal Permission Granted  Share Information with NAME: Enid Derry  Permission granted to share info w AGENCY: SNF's  Permission granted to share info w Relationship: wife     Emotional Assessment       Orientation: : Oriented to Self,Oriented to Place,Oriented to  Time,Oriented to Situation Alcohol / Substance Use: Not Applicable Psych Involvement: No (comment)  Admission diagnosis:  Acute hyponatremia [E87.1] Hypokalemia [E87.6] Weakness [R53.1] Neuroendocrine cancer ([place]) [C7A.8] Patient Active Problem List   Diagnosis Date Noted  . Palliative care encounter   . Acute hyponatremia 03/20/2021  . Goals of care, counseling/discussion 02/08/2021  . Former smoker 01/10/2021  .  Encounter for antineoplastic chemotherapy 01/10/2021  . Metastatic lung carcinoma, left ([place]) 01/09/2021   PCP:  Baxter Hire, MD Pharmacy:   [facility] [address], [place] - [place] [address] [place] [address] Phone: [phone] Fax: [phone]     Social Determinants of Health (SDOH) Interventions    Readmission Risk Interventions No flowsheet data found.

## 2021-03-21 NOTE — Progress Notes (Signed)
Pt s WBC came back critical before start of this RN shift, MD notifed, no new order

## 2021-03-21 NOTE — Plan of Care (Signed)
  Problem: Education: Goal: Knowledge of General Education information will improve Description Including pain rating scale, medication(s)/side effects and non-pharmacologic comfort measures Outcome: Progressing   

## 2021-03-21 NOTE — Progress Notes (Signed)
[facility]) Hospital Liaison RN note:  Received new referral for [facility] out patient palliative program to follow post discharge from Altha Harm, NP. Patient information given to referral. Plan is to discharge to SNF when medically ready. Doran Clay, TOC notified of referral. [facility] Liaison will continue to follow for disposition.  Thank you for this referral.  Zandra Abts, RN [facility] Liaison [phone]

## 2021-03-22 ENCOUNTER — Inpatient Hospital Stay: Payer: Medicare HMO | Admitting: Oncology

## 2021-03-22 ENCOUNTER — Encounter: Payer: Self-pay | Admitting: *Deleted

## 2021-03-22 ENCOUNTER — Inpatient Hospital Stay: Payer: Medicare HMO

## 2021-03-22 ENCOUNTER — Telehealth: Payer: Self-pay

## 2021-03-22 DIAGNOSIS — R627 Adult failure to thrive: Secondary | ICD-10-CM | POA: Diagnosis not present

## 2021-03-22 DIAGNOSIS — E876 Hypokalemia: Secondary | ICD-10-CM | POA: Diagnosis present

## 2021-03-22 DIAGNOSIS — C3492 Malignant neoplasm of unspecified part of left bronchus or lung: Secondary | ICD-10-CM | POA: Diagnosis present

## 2021-03-22 DIAGNOSIS — K5900 Constipation, unspecified: Secondary | ICD-10-CM | POA: Diagnosis present

## 2021-03-22 DIAGNOSIS — I219 Acute myocardial infarction, unspecified: Secondary | ICD-10-CM | POA: Diagnosis not present

## 2021-03-22 DIAGNOSIS — D701 Agranulocytosis secondary to cancer chemotherapy: Secondary | ICD-10-CM | POA: Diagnosis not present

## 2021-03-22 DIAGNOSIS — E871 Hypo-osmolality and hyponatremia: Secondary | ICD-10-CM | POA: Diagnosis not present

## 2021-03-22 DIAGNOSIS — D61818 Other pancytopenia: Secondary | ICD-10-CM | POA: Diagnosis present

## 2021-03-22 DIAGNOSIS — C7931 Secondary malignant neoplasm of brain: Secondary | ICD-10-CM

## 2021-03-22 DIAGNOSIS — E440 Moderate protein-calorie malnutrition: Secondary | ICD-10-CM

## 2021-03-22 DIAGNOSIS — Z66 Do not resuscitate: Secondary | ICD-10-CM | POA: Diagnosis present

## 2021-03-22 DIAGNOSIS — Z515 Encounter for palliative care: Secondary | ICD-10-CM | POA: Diagnosis not present

## 2021-03-22 DIAGNOSIS — I10 Essential (primary) hypertension: Secondary | ICD-10-CM | POA: Diagnosis present

## 2021-03-22 DIAGNOSIS — C7A1 Malignant poorly differentiated neuroendocrine tumors: Secondary | ICD-10-CM | POA: Diagnosis present

## 2021-03-22 DIAGNOSIS — T451X5A Adverse effect of antineoplastic and immunosuppressive drugs, initial encounter: Secondary | ICD-10-CM

## 2021-03-22 DIAGNOSIS — R531 Weakness: Secondary | ICD-10-CM | POA: Diagnosis not present

## 2021-03-22 DIAGNOSIS — J189 Pneumonia, unspecified organism: Secondary | ICD-10-CM | POA: Diagnosis not present

## 2021-03-22 DIAGNOSIS — D61810 Antineoplastic chemotherapy induced pancytopenia: Secondary | ICD-10-CM | POA: Diagnosis present

## 2021-03-22 DIAGNOSIS — I252 Old myocardial infarction: Secondary | ICD-10-CM | POA: Diagnosis not present

## 2021-03-22 DIAGNOSIS — Z801 Family history of malignant neoplasm of trachea, bronchus and lung: Secondary | ICD-10-CM | POA: Diagnosis not present

## 2021-03-22 DIAGNOSIS — Z7401 Bed confinement status: Secondary | ICD-10-CM | POA: Diagnosis not present

## 2021-03-22 DIAGNOSIS — B370 Candidal stomatitis: Secondary | ICD-10-CM | POA: Diagnosis not present

## 2021-03-22 DIAGNOSIS — T380X5A Adverse effect of glucocorticoids and synthetic analogues, initial encounter: Secondary | ICD-10-CM | POA: Diagnosis present

## 2021-03-22 DIAGNOSIS — M2550 Pain in unspecified joint: Secondary | ICD-10-CM | POA: Diagnosis not present

## 2021-03-22 DIAGNOSIS — I2510 Atherosclerotic heart disease of native coronary artery without angina pectoris: Secondary | ICD-10-CM | POA: Diagnosis present

## 2021-03-22 DIAGNOSIS — R17 Unspecified jaundice: Secondary | ICD-10-CM | POA: Diagnosis present

## 2021-03-22 DIAGNOSIS — C7A8 Other malignant neuroendocrine tumors: Secondary | ICD-10-CM

## 2021-03-22 DIAGNOSIS — E271 Primary adrenocortical insufficiency: Secondary | ICD-10-CM | POA: Diagnosis not present

## 2021-03-22 DIAGNOSIS — Z20822 Contact with and (suspected) exposure to covid-19: Secondary | ICD-10-CM | POA: Diagnosis present

## 2021-03-22 DIAGNOSIS — E2740 Unspecified adrenocortical insufficiency: Secondary | ICD-10-CM | POA: Diagnosis not present

## 2021-03-22 DIAGNOSIS — R0902 Hypoxemia: Secondary | ICD-10-CM | POA: Diagnosis not present

## 2021-03-22 DIAGNOSIS — R5381 Other malaise: Secondary | ICD-10-CM | POA: Diagnosis not present

## 2021-03-22 DIAGNOSIS — R739 Hyperglycemia, unspecified: Secondary | ICD-10-CM | POA: Diagnosis not present

## 2021-03-22 DIAGNOSIS — K219 Gastro-esophageal reflux disease without esophagitis: Secondary | ICD-10-CM | POA: Diagnosis present

## 2021-03-22 DIAGNOSIS — E43 Unspecified severe protein-calorie malnutrition: Secondary | ICD-10-CM | POA: Diagnosis not present

## 2021-03-22 LAB — CBC
HCT: 20.9 % — ABNORMAL LOW (ref 39.0–52.0)
Hemoglobin: 7.7 g/dL — ABNORMAL LOW (ref 13.0–17.0)
MCH: 32.2 pg (ref 26.0–34.0)
MCHC: 36.8 g/dL — ABNORMAL HIGH (ref 30.0–36.0)
MCV: 87.4 fL (ref 80.0–100.0)
Platelets: 51 10*3/uL — ABNORMAL LOW (ref 150–400)
RBC: 2.39 MIL/uL — ABNORMAL LOW (ref 4.22–5.81)
RDW: 13.5 % (ref 11.5–15.5)
WBC: 0.6 10*3/uL — CL (ref 4.0–10.5)
nRBC: 0 % (ref 0.0–0.2)

## 2021-03-22 LAB — BASIC METABOLIC PANEL
Anion gap: 7 (ref 5–15)
BUN: 10 mg/dL (ref 8–23)
CO2: 24 mmol/L (ref 22–32)
Calcium: 8.1 mg/dL — ABNORMAL LOW (ref 8.9–10.3)
Chloride: 103 mmol/L (ref 98–111)
Creatinine, Ser: 0.36 mg/dL — ABNORMAL LOW (ref 0.61–1.24)
GFR, Estimated: >60 mL/min (ref >60)
Glucose, Bld: 305 mg/dL — ABNORMAL HIGH (ref 70–99)
Potassium: 3.9 mmol/L (ref 3.5–5.1)
Sodium: 134 mmol/L — ABNORMAL LOW (ref 135–145)

## 2021-03-22 LAB — PREPARE RBC (CROSSMATCH)

## 2021-03-22 LAB — MAGNESIUM: Magnesium: 2 mg/dL (ref 1.7–2.4)

## 2021-03-22 LAB — ABO/RH: ABO/RH(D): A POS

## 2021-03-22 MED ORDER — ONDANSETRON HCL 4 MG/2ML IJ SOLN
4.0000 mg | Freq: Four times a day (QID) | INTRAMUSCULAR | Status: DC | PRN
  Administered 2021-03-22 – 2021-03-23 (×3): 4 mg via INTRAVENOUS
  Filled 2021-03-22 (×3): qty 2

## 2021-03-22 MED ORDER — CHLORHEXIDINE GLUCONATE CLOTH 2 % EX PADS
6.0000 | MEDICATED_PAD | Freq: Every day | CUTANEOUS | Status: DC
  Administered 2021-03-22 – 2021-03-28 (×6): 6 via TOPICAL

## 2021-03-22 MED ORDER — GLUCERNA SHAKE PO LIQD
237.0000 mL | Freq: Two times a day (BID) | ORAL | Status: DC
  Administered 2021-03-23 – 2021-03-27 (×7): 237 mL via ORAL

## 2021-03-22 MED ORDER — SODIUM CHLORIDE 0.9% IV SOLUTION
Freq: Once | INTRAVENOUS | Status: AC
Start: 2021-03-22 — End: 2021-03-22

## 2021-03-22 MED ORDER — POLYETHYLENE GLYCOL 3350 17 G PO PACK
17.0000 g | PACK | Freq: Two times a day (BID) | ORAL | Status: DC
  Administered 2021-03-22 – 2021-03-23 (×2): 17 g via ORAL
  Filled 2021-03-22 (×3): qty 1

## 2021-03-22 MED ORDER — CHLORHEXIDINE GLUCONATE 0.12 % MT SOLN
15.0000 mL | Freq: Two times a day (BID) | OROMUCOSAL | Status: DC
Start: 2021-03-22 — End: 2021-03-29
  Administered 2021-03-22 – 2021-03-28 (×9): 15 mL via OROMUCOSAL
  Filled 2021-03-22 (×12): qty 15

## 2021-03-22 MED ORDER — DEXAMETHASONE 4 MG PO TABS
2.0000 mg | ORAL_TABLET | Freq: Every day | ORAL | Status: DC
  Administered 2021-03-23: 09:00:00 2 mg via ORAL
  Filled 2021-03-22: qty 1

## 2021-03-22 MED ORDER — POLYETHYLENE GLYCOL 3350 17 G PO PACK: 17.0000 g | PACK | Freq: Every day | ORAL | Status: DC

## 2021-03-22 NOTE — Evaluation (Signed)
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  Patient is s/p whole brain radiation. He is now on treatment with systemic chemotherapy/immunotherapy. Presented d/t failure to thrive, he was unable to get off the commode and spouse reports being unable to care for him at home.   Clinical Impression   Pt seen for OT evaluation this date in setting of acute hospitalization d/t hyponatremia. Pt reports being INDEP with BADLs and fxl mobility up until ~3 weeks ago. Pt reports living with a spouse in [facility] with 5 STE and b/l rails. Has BSC and 2WW that were previously his late mother-in-law's. Pt presents this date with decreased fxl activity tolerance and decreased strength of trunk and limbs. Pt currently requires: SETUP to MIN A for seated UB ADLs such as self feeding and UB dressing. MIN A for LB dressing including threading socks. CGA/SBA for ADL transfers with RW for stability. SBA to take 4-5 steps from EOB to chair. Will continue to follow acutely as pt is currently requiring increased time and assist for safety with most aspects of self care and mobility. Recommend pt f/u in STR setting to improve strength and activity tolerance to safely complete ADLs and ADL mobility, both in the home and community.     Follow Up Recommendations  SNF    Equipment Recommendations  Other (comment) (defer)    Recommendations for Other Services       Precautions / Restrictions Precautions Precautions: Fall Restrictions Weight Bearing Restrictions: No      Mobility Bed Mobility Overal bed mobility: Modified Independent             General bed mobility comments: increased time, HOB elevated, use of bed rails    Transfers Overall transfer  level: Needs assistance Equipment used: Rolling walker (2 wheeled) Transfers: Sit to/from Stand Sit to Stand: Supervision;Min guard         General transfer comment: increased time, cues for safety. Pt initially unwilling to trial with RW, but OT places RW in front of him and pt does ultimately use to stabilize himself.    Balance Overall balance assessment: Needs assistance Sitting-balance support: Feet supported Sitting balance-Leahy Scale: Good     Standing balance support: Single extremity supported;During functional activity Standing balance-Leahy Scale: Fair Standing balance comment: at least unilateral support                           ADL either performed or assessed with clinical judgement   ADL Overall ADL's : Needs assistance/impaired                                       General ADL Comments: SETUP to MIN A for seated UB ADLs such as self feeding and UB dressing. MIN A for LB dressing including threading socks. CGA/SBA for ADL transfers with RW for stability. SBA to take 4-5 steps from EOB to chair.     Vision Patient Visual Report: No change from baseline       Perception     Praxis      Pertinent Vitals/Pain Pain Assessment: Faces Faces Pain Scale: Hurts little more  Pain Location: LBP, chronic. Grimmaces with getting OOB Pain Descriptors / Indicators: Grimacing Pain Intervention(s): Limited activity within patient's tolerance;Monitored during session;Repositioned     Hand Dominance     Extremity/Trunk Assessment Upper Extremity Assessment Upper Extremity Assessment: Generalized weakness (ROM WFL, MMT grossly 4-/5. Visibly notable atrohpied extremities)   Lower Extremity Assessment Lower Extremity Assessment: Generalized weakness       Communication Communication Communication: No difficulties   Cognition Arousal/Alertness: Awake/alert Behavior During Therapy: WFL for tasks assessed/performed Overall Cognitive  Status: Impaired/Different from baseline                                 General Comments: Pt is oriented to month, day, date, and some aspects of situation. He does require increased time for processing and increased cues to sequence steps of simple ADL tasks. Pt's spouse reports a gradual decline since start of cancer treatments with and increased rate of cognitive decline notable in the past 3 weeks as pt's strength has been waning.   General Comments       Exercises Other Exercises Other Exercises: OT engages pt in ed re: d/c planning, safety considerations, safe use of RW, importance of OOB activity and ed re: 3 UB exercises he can perform to improve stability including upward punches, shld and elbow extension for tricep strengthening, and bicep curls. Pt with moderate reception, his spouse with good reception and carryover demonstrated.   Shoulder Instructions      Home Living Family/patient expects to be discharged to:: Private residence Living Arrangements: Spouse/significant other Available Help at Discharge: Family;Available 24 hours/day Type of Home: House Home Access: Stairs to enter [employer] of Steps: 5 Entrance Stairs-Rails: Right;Left;Can reach both Home Layout: One level               Home Equipment: Walker - 2 wheels;Bedside commode;Shower seat   Additional Comments: all equipment is from his spouse's mother who passed 2 weeks ago      Prior Functioning/Environment Level of Independence: Independent        Comments: Pt was INDEP with no AD for fxl mobility up until ~3WA. Was driving some. Reports needing his spouse's help increasingly over past 3 weeks d/t weakness and constantly feeling "sleepy"        OT Problem List: Decreased strength;Decreased activity tolerance;Impaired balance (sitting and/or standing);Decreased safety awareness;Decreased knowledge of use of DME or AE;Pain      OT Treatment/Interventions: Self-care/ADL  training;DME and/or AE instruction;Therapeutic activities;Balance training;Therapeutic exercise;Energy conservation;Patient/family education    OT Goals(Current goals can be found in the care plan section) Acute Rehab OT Goals Patient Stated Goal: to get to where I am not so tired all the time OT Goal Formulation: With patient/family Time For Goal Achievement: 04/05/21 Potential to Achieve Goals: Good ADL Goals Pt Will Perform Lower Body Dressing: with modified independence;sit to/from stand Pt Will Transfer to Toilet: with supervision;ambulating (with LRAD to/from [facility] ~10' away to increase tolerance for fxl distances.) Pt Will Perform Toileting - Clothing Manipulation and hygiene: with supervision;sit to/from stand Pt Will Perform Tub/Shower Transfer: with supervision;rolling walker;shower seat;grab bars Pt/caregiver will Perform Home Exercise Program: Increased strength;Both right and left upper extremity;With minimal assist  OT Frequency: Min 1X/week   Barriers to D/C: Decreased caregiver support          Co-evaluation              AM-PAC OT "6 Clicks" Daily Activity  Outcome Measure Help from another person eating meals?: A Little Help from another person taking care of personal grooming?: A Little Help from another person toileting, which includes using toliet, bedpan, or urinal?: A Little Help from another person bathing (including washing, rinsing, drying)?: A Little Help from another person to put on and taking off regular upper body clothing?: A Little Help from another person to put on and taking off regular lower body clothing?: A Little 6 Click Score: 18   End of Session Equipment Utilized During Treatment: Gait belt;Rolling walker Nurse Communication: Mobility status  Activity Tolerance: Patient tolerated treatment well Patient left: in chair;with call bell/phone within reach;with chair alarm set;with family/visitor present  OT Visit Diagnosis: Unsteadiness  on feet (R26.81);Muscle weakness (generalized) (M62.81)                Time: 7681-1572 OT Time Calculation (min): 57 min Charges:  OT General Charges $OT Visit: 1 Visit OT Evaluation $OT Eval Moderate Complexity: 1 Mod OT Treatments $Self Care/Home Management : 23-37 mins $Therapeutic Activity: 8-22 mins  Gerrianne Scale, MS, OTR/L ascom [phone] 03/22/21, 2:19 PM
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
## 2021-03-22 NOTE — TOC Progression Note (Signed)
Transition of Care [facility]) - Progression Note    Patient Details  Name: KARRINGTON STUDNICKA MRN: [id number] Date of Birth: [birth year]  Transition of [facility]) CM/SW Contact  Shelbie Hutching, RN Phone Number: 03/22/2021, 4:16 PM  Clinical Narrative:    2 bed offers presented to patient's wife, Enid Derry.  Enid Derry chooses [facility].     Expected Discharge Plan: [place] Barriers to Discharge: Continued Medical Work up  Expected Discharge Plan and Services Expected Discharge Plan: [place]   Discharge Planning Services: CM Consult Post Acute Care Choice: [place] Living arrangements for the past 2 months: Single Family Home                 DME Arranged: N/A DME Agency: NA       HH Arranged: NA           Social Determinants of Health (SDOH) Interventions    Readmission Risk Interventions No flowsheet data found.
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
## 2021-03-22 NOTE — Telephone Encounter (Signed)
error 

## 2021-03-22 NOTE — Progress Notes (Signed)
PROGRESS NOTE    Travis Marshall  YTK:[id number] DOB: 05-Jul-1946 DOA: 03/20/2021 PCP: Baxter Hire, MD  Chief Complaint  Patient presents with  . Abnormal Lab  . 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  Followed by oncology.  Coming in with diffuse weakness and unable to get off the commode.  Wife states that she cannot take care of him at home.  Patient also has electrolyte abnormalities and severe constipation.   Assessment & Plan:   Active Problems:   Neuroendocrine cancer ([place])   Acute hyponatremia   Palliative care encounter   Weakness   Hypomagnesemia   Hypokalemia   Brain metastasis (H[facility])   Failure to thrive in adult  Diffuse weakness.   -Unable to get up off the commode.  Patient's wife states that she cannot take care of him at home.  -/OT consulted. -Likely pancytopenia from his recent chemo was contributing.  Pancytopenia -Patient with significant leukopenia, anemia and thrombocytopenia, this is all in setting of his recent chemotherapy,. -- I have discussed with his oncologist, he received recent long-acting GSF, no indication for Granix, but given anemia with hemoglobin of 7.7 will transfuse 1 unit of irradiated PRBC.  Hypomagnesemia replace magnesium 2 g IV daily.  Hypokalemia replace potassium orally.  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  Appreciate oncology consultation and palliative care consultation.  Patient made a DO NOT RESUSCITATE.  Empiric Keppra for brain mets.  Severe constipation start MiraLAX and lactulose daily  Failure to thrive  Hyponatremia improved only one-point lower than normal range.    DVT prophylaxis: SCD's Code Status: DNR Family Communication: D/W wife at bedsdie Disposition:   Status is: Observation  The patient will require care spanning > 2 midnights and should be moved to inpatient because: IV treatments appropriate due to intensity of illness or  inability to take PO  Dispo: The patient is from: Home              Anticipated d/c is to: Home              Patient currently is not medically stable to d/c.   Difficult to place patient No       Consultants:   Oncology  Palliaitve   Subjective:  Patient reports she had a good night sleep, denies fever, chills, chest pain or shortness of breath, reports generalized weakness and fatigue  Objective: Vitals:   03/21/21 1953 03/21/21 2334 03/22/21 0338 03/22/21 1043  BP: (!) 103/58 96/78 (!) 99/53 103/75  Pulse: 78 69 71 89  Resp: 16 16 15 16   Temp: 97.8 F (36.6 C) 97.6 F (36.4 C) 97.7 F (36.5 C) 97.6 F (36.4 C)  TempSrc:   Oral Oral  SpO2: 100% 94% 96% 100%  Weight:      Height:        Intake/Output Summary (Last 24 hours) at 03/22/2021 1101 Last data filed at 03/21/2021 1545 Gross per 24 hour  Intake 537.48 ml  Output --  Net 537.48 ml   Filed Weights   03/20/21 1648  Weight: 63.5 kg    Examination:  General exam: Appears calm and comfortable ,frail Respiratory system: Clear to auscultation. Respiratory effort normal. Cardiovascular system: S1 & S2 heard, RRR. No JVD, murmurs, rubs, gallops or clicks. No pedal edema. Gastrointestinal system: Abdomen is nondistended, soft and nontender. No organomegaly or masses felt. Normal bowel sounds heard. Central nervous system: Alert and oriented. No focal  neurological deficits. Extremities: Symmetric 5 x 5 power. Skin: No rashes, lesions or ulcers Psychiatry: Judgement and insight appear normal. Mood & affect appropriate.     Data Reviewed: I have personally reviewed following labs and imaging studies  CBC: Recent Labs  Lab 03/20/21 1514 03/21/21 0530 03/22/21 0508  WBC 2.3* 1.3* 0.6*  NEUTROABS 2.0  --   --   HGB 10.4* 8.3* 7.7*  HCT 27.8* 22.3* 20.9*  MCV 86.6 86.8 87.4  PLT 78* 61* 51*    Basic Metabolic Panel: Recent Labs  Lab 03/20/21 1514 03/20/21 1708 03/21/21 0530 03/22/21 0508  NA  130*  --  134* 134*  K 3.0*  --  3.2* 3.9  CL 98  --  105 103  CO2 23  --  24 24  GLUCOSE 251*  --  196* 305*  BUN 20  --  12 10  CREATININE 0.38*  --  0.40* 0.36*  CALCIUM 7.9*  --  7.5* 8.1*  MG  --  1.7  --  2.0    GFR: Estimated Creatinine Clearance: 73.9 mL/min (A) (by C-G formula based on SCr of 0.36 mg/dL (L)).  Liver Function Tests: Recent Labs  Lab 03/20/21 1514  AST 18  ALT 25  ALKPHOS 37*  BILITOT 1.8*  PROT 5.4*  ALBUMIN 3.0*    CBG: No results for input(s): GLUCAP in the last 168 hours.   Recent Results (from the past 240 hour(s))  Resp Panel by RT-PCR (Flu A&B, Covid) Nasopharyngeal Swab     Status: None   Collection Time: 03/20/21  5:08 PM   Specimen: Nasopharyngeal Swab; Nasopharyngeal(NP) swabs in vial transport medium  Result Value Ref Range Status   SARS Coronavirus 2 by RT PCR NEGATIVE NEGATIVE Final    Comment: (NOTE) SARS-CoV-2 target nucleic acids are NOT DETECTED.  The SARS-CoV-2 RNA is generally detectable in upper respiratory specimens during the acute phase of infection. The lowest concentration of SARS-CoV-2 viral copies this assay can detect is 138 copies/mL. A negative result does not preclude SARS-Cov-2 infection and should not be used as the sole basis for treatment or other patient management decisions. A negative result may occur with  improper specimen collection/handling, submission of specimen other than nasopharyngeal swab, presence of viral mutation(s) within the areas targeted by this assay, and inadequate number of viral copies(<138 copies/mL). A negative result must be combined with clinical observations, patient history, and epidemiological information. The expected result is Negative.  Fact Sheet for Patients:  EntrepreneurPulse.com.au  Fact Sheet for Healthcare Providers:  IncredibleEmployment.be  This test is no t yet approved or cleared by the [place] FDA and  has been  authorized for detection and/or diagnosis of SARS-CoV-2 by FDA under an Emergency Use Authorization (EUA). This EUA will remain  in effect (meaning this test can be used) for the duration of the COVID-19 declaration under Section 564(b)(1) of the Act, 21 U.S.C.section 360bbb-3(b)(1), unless the authorization is terminated  or revoked sooner.       Influenza A by PCR NEGATIVE NEGATIVE Final   Influenza B by PCR NEGATIVE NEGATIVE Final    Comment: (NOTE) The Xpert Xpress SARS-CoV-2/FLU/RSV plus assay is intended as an aid in the diagnosis of influenza from Nasopharyngeal swab specimens and should not be used as a sole basis for treatment. Nasal washings and aspirates are unacceptable for Xpert Xpress SARS-CoV-2/FLU/RSV testing.  Fact Sheet for Patients: EntrepreneurPulse.com.au  Fact Sheet for Healthcare Providers: IncredibleEmployment.be  This test is not yet approved or  cleared by the [place] and has been authorized for detection and/or diagnosis of SARS-CoV-2 by FDA under an Emergency Use Authorization (EUA). This EUA will remain in effect (meaning this test can be used) for the duration of the COVID-19 declaration under Section 564(b)(1) of the Act, 21 U.S.C. section 360bbb-3(b)(1), unless the authorization is terminated or revoked.  Performed at [facility], [address] [place], [place] [address]   Culture, blood (Routine X 2) w Reflex to ID Panel     Status: None (Preliminary result)   Collection Time: 03/21/21  5:30 AM   Specimen: BLOOD  Result Value Ref Range Status   Specimen Description BLOOD RIGHT HAND  Final   Special Requests   Final    BOTTLES DRAWN AEROBIC AND ANAEROBIC Blood Culture adequate volume   Culture   Final    NO GROWTH 1 DAY Performed at [facility], [address] [place], [place] [address]    Report Status PENDING  Incomplete  Culture, blood (Routine X 2) w Reflex to ID  Panel     Status: None (Preliminary result)   Collection Time: 03/21/21  5:36 AM   Specimen: BLOOD  Result Value Ref Range Status   Specimen Description BLOOD LEFT HAND  Final   Special Requests   Final    BOTTLES DRAWN AEROBIC ONLY Blood Culture adequate volume   Culture   Final    NO GROWTH 1 DAY Performed at [facility], [address], [place], [place] [address]    Report Status PENDING  Incomplete         Radiology Studies: CT ABDOMEN PELVIS WO CONTRAST  Result Date: 03/20/2021 CLINICAL DATA:  Known high-grade neuroendocrine carcinoma with abdominal distension, initial encounter EXAM: CT ABDOMEN AND PELVIS WITHOUT CONTRAST TECHNIQUE: Multidetector CT imaging of the abdomen and pelvis was performed following the standard protocol without IV contrast. COMPARISON:  01/23/2021 FINDINGS: Lower chest: Previously seen right lower lobe mass is again identified but has decreased in size significantly now measuring approximately 2.4 by 1.3 cm in greatest dimension. It previously measured 4.5 cm in dimension. No sizable effusion is seen. Hepatobiliary: No focal liver abnormality is seen. No gallstones, gallbladder wall thickening, or biliary dilatation. Pancreas: Unremarkable. No pancreatic ductal dilatation or surrounding inflammatory changes. Spleen: Normal in size without focal abnormality. Adrenals/Urinary Tract: Adrenal glands are within normal limits. Kidneys are well visualized bilaterally. Tiny 1-2 mm stone is noted in the midportion of the left kidney. No mass lesion or hydronephrosis is noted. The bladder is partially distended. Stomach/Bowel: Scattered diverticular change of the colon is noted. Mild retained fecal material is seen. No obstructive or inflammatory changes are noted. The appendix has been surgically removed. Small bowel and stomach appear within normal limits. Vascular/Lymphatic: Atherosclerotic calcifications are noted. The previously seen retrocaval lymph node has  decreased in size now measuring 3-4 mm in short axis decreased from 11 mm on the prior exam. Reproductive: Prostate is unremarkable. Other: No abdominal wall hernia or abnormality. No abdominopelvic ascites. Musculoskeletal: No acute or significant osseous findings. IMPRESSION: Interval reduction in size of right lower lobe mass and retrocaval lymph nodes seen on prior exam. Tiny 1-2 mm stone in the left kidney without obstructive change. Changes of mild diverticulosis without diverticulitis. Electronically Signed   By: Inez Catalina M.D.   On: 03/20/2021 19:08   DG Chest 2 View  Result Date: 03/20/2021 CLINICAL DATA:  Possible sepsis. Weakness. Neuroendocrine carcinoma. EXAM: CHEST - 2 VIEW COMPARISON:  01/10/2021 chest radiograph. A PET of 01/23/2021 is also reviewed. FINDINGS: Right Port-A-Cath tip high right atrium. Patient rotated right. Midline trachea. Normal heart size. Tortuous thoracic aorta. No pleural effusion or pneumothorax. Basilar predominant interstitial thickening is likely related to interstitial lung disease when correlated with prior PET. No lobar consolidation. Lateral view degraded by patient arm position. IMPRESSION: No evidence of pneumonia. 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 Electronically Signed   By: Abigail Miyamoto M.D.   On: 03/20/2021 18:55   CT Head Wo Contrast  Result Date: 03/20/2021 CLINICAL DATA:  History of known brain metastatic disease with neuroendocrine carcinoma, this shell encounter EXAM: CT HEAD WITHOUT CONTRAST TECHNIQUE: Contiguous axial images were obtained from the base of the skull through the vertex without intravenous contrast. COMPARISON:  01/20/2021 MRI FINDINGS: Brain: Previously seen metastatic disease is not well appreciated on this exam. No surrounding white matter edema to suggest persistent large metastatic disease is noted. Mild atrophic changes are noted. No findings to suggest acute  hemorrhage or acute infarction are noted. Vascular: No hyperdense vessel or unexpected calcification. Skull: Normal. Negative for fracture or focal lesion. Sinuses/Orbits: No acute finding. Other: None. IMPRESSION: Previously seen metastatic disease is not well appreciated on today's exam. No acute abnormality is noted. Mild atrophic changes. Electronically Signed   By: Inez Catalina M.D.   On: 03/20/2021 19:10   MR Brain W and Wo Contrast  Result Date: 03/20/2021 CLINICAL DATA:  Progressive weakness for 2 months. 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 Prior whole brain radiation. EXAM: MRI HEAD WITHOUT AND WITH CONTRAST TECHNIQUE: Multiplanar, multiecho pulse sequences of the brain and surrounding structures were obtained without and with intravenous contrast. CONTRAST:  35mL GADAVIST GADOBUTROL 1 MMOL/ML IV SOLN COMPARISON:  01/20/2021 FINDINGS: Brain: No acute infarct, mass effect or extra-axial collection. 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 There is multifocal hyperintense T2-weighted signal within the white matter. Parenchymal volume and CSF spaces are normal. The midline structures are normal. Posterior left temporal lesion has decreased in size (series 18, image 90), now measuring 90 mm. Inferior left cerebellar lesion has also decreased in size and now measures 3 mm (image 40). The other lesions demonstrated on the previous MRI are no longer visible. There are no new lesions. There is no perilesional edema. Vascular: Major flow voids are preserved. Skull and upper cervical spine: Normal calvarium and skull base. Visualized upper cervical spine and soft tissues are normal. Sinuses/Orbits:No paranasal sinus fluid levels or advanced mucosal thickening. No mastoid or middle ear effusion. Normal orbits. IMPRESSION: 1. Positive response to therapy with decreased size of 2 lesions located in the posterior left temporal lobe and inferior left  cerebellar hemisphere. The other lesions have resolved. There are no new lesions. 2. No acute intracranial abnormality. Electronically Signed   By: Ulyses Jarred M.D.   On: 03/20/2021 23:15        Scheduled Meds: . sodium chloride   Intravenous Once  . aspirin EC  81 mg Oral Daily  . dexamethasone  4 mg Oral Daily  . finasteride  5 mg Oral BH-q7a  . lactulose  20 g Oral BID  . levETIRAcetam  500 mg Oral BID  . pantoprazole  40 mg Oral QAC breakfast  . polyethylene glycol  17 g Oral Daily  . senna-docusate  1 tablet Oral BID   Continuous Infusions: . dextrose 5 % and 0.45 % NaCl with  KCl 20 mEq/L 75 mL/hr at 03/22/21 0237     LOS: 0 days     Phillips Climes, MD [facility]   To contact the attending provider between 7A-7P or the covering provider during after hours 7P-7A, please log into the web site [email or web address] and access using universal [place] password for that web site. If you do not have the password, please call the hospital operator.  03/22/2021, 11:01 AM

## 2021-03-22 NOTE — Progress Notes (Signed)
Inpatient Diabetes Program Recommendations  AACE/ADA: New Consensus Statement on Inpatient Glycemic Control   Target Ranges:  Prepandial:   less than 140 mg/dL      Peak postprandial:   less than 180 mg/dL (1-2 hours)      Critically ill patients:  140 - 180 mg/dL  Results for WOODLEY, PETZOLD (MRN [id number]) as of 03/22/2021 11:27  Ref. Range 03/21/2021 05:30 03/22/2021 05:08  Glucose Latest Ref Range: 70 - 99 mg/dL 196 (H) 305 (H)   Results for DAIRON, PROCTER (MRN [id number]) as of 03/22/2021 11:27  Ref. Range 02/22/2021 14:19  Hemoglobin A1C Latest Ref Range: 4.8 - 5.6 % 9.7 (H)   Review of Glycemic Control  Diabetes history: Hyperglycemia due to steroids for brain metastasis  Outpatient Diabetes medications: Metformin 500 mg daily (per home med list not taking) Current orders for Inpatient glycemic control: none; Decadron 4 mg daily  Inpatient Diabetes Program Recommendations:    Insulin: While inpatient, please consider ordering CBGs AC&HS with Novolog 0-9 units TID with meals and Novolog 0-5 units QHS.  Outpatient: If appropriate for patient, will likely need to be prescribed DM medication as an outpatient especially if steroids are continued.  NOTE: In reviewing chart, noted patient was seen in Emergency Room on 02/22/21 for hyperglycemia.  Per note by Dr. Ellender Hose on 02/22/21, "Discussed the case with Dr. Tasia Catchings, the patient's oncologist, who will decrease the patient's Decadron, advises dietary changes, and will follow-up next week." Per note by Dr. Tasia Catchings on 03/01/21, it was recommended that patient be started on DM medications but patient preferred to discuss further with PCP. Not able to see any office visit with PCP following that visit on 03/01/21 with Dr. Tasia Catchings. Patient presented to Emergency room on 03/20/21 due to increased weakness. Per chart patient has high-grade neuroendocrine of the lung with metastasis to the brain and treated with steroids.  Thanks, Barnie Alderman, RN, MSN,  CDE Diabetes Coordinator Inpatient Diabetes Program [phone] (Team Pager from 8am to 5pm)

## 2021-03-23 LAB — CBC
Hemoglobin: 9.8 g/dL — ABNORMAL LOW (ref 13.0–17.0)
Hemoglobin: 10 g/dL — ABNORMAL LOW (ref 13.0–17.0)
Platelets: 39 10*3/uL — ABNORMAL LOW (ref 150–400)
Platelets: 42 10*3/uL — ABNORMAL LOW (ref 150–400)
WBC: 0.4 10*3/uL — CL (ref 4.0–10.5)
WBC: 0.4 10*3/uL — CL (ref 4.0–10.5)
nRBC: 7.1 % — ABNORMAL HIGH (ref 0.0–0.2)
nRBC: 10.3 % — ABNORMAL HIGH (ref 0.0–0.2)

## 2021-03-23 LAB — BASIC METABOLIC PANEL
Anion gap: 8 (ref 5–15)
BUN: 18 mg/dL (ref 8–23)
CO2: 24 mmol/L (ref 22–32)
Calcium: 8.3 mg/dL — ABNORMAL LOW (ref 8.9–10.3)
Chloride: 103 mmol/L (ref 98–111)
Creatinine, Ser: 0.41 mg/dL — ABNORMAL LOW (ref 0.61–1.24)
GFR, Estimated: >60 mL/min (ref >60)
Glucose, Bld: 268 mg/dL — ABNORMAL HIGH (ref 70–99)
Potassium: 4 mmol/L (ref 3.5–5.1)
Sodium: 135 mmol/L (ref 135–145)

## 2021-03-23 LAB — TYPE AND SCREEN
ABO/RH(D): A POS
Antibody Screen: NEGATIVE
Unit division: 0

## 2021-03-23 LAB — BPAM RBC
Blood Product Expiration Date: 202206082359
ISSUE DATE / TIME: 202205181424
Unit Type and Rh: 6200

## 2021-03-23 LAB — GLUCOSE, CAPILLARY
Glucose-Capillary: 218 mg/dL — ABNORMAL HIGH (ref 70–99)
Glucose-Capillary: 204 mg/dL — ABNORMAL HIGH (ref 70–99)
Glucose-Capillary: 224 mg/dL — ABNORMAL HIGH (ref 70–99)
Glucose-Capillary: 158 mg/dL — ABNORMAL HIGH (ref 70–99)
Glucose-Capillary: 157 mg/dL — ABNORMAL HIGH (ref 70–99)

## 2021-03-23 LAB — CORTISOL: Cortisol, Plasma: 2.7 ug/dL

## 2021-03-23 MED ORDER — ADULT MULTIVITAMIN W/MINERALS CH
1.0000 | ORAL_TABLET | Freq: Every day | ORAL | Status: DC
  Administered 2021-03-26 – 2021-03-28 (×3): 1 via ORAL
  Filled 2021-03-23 (×4): qty 1

## 2021-03-23 MED ORDER — FLEET ENEMA 7-19 GM/118ML RE ENEM
1.0000 | ENEMA | Freq: Once | RECTAL | Status: AC
  Administered 2021-03-23: 1 via RECTAL

## 2021-03-23 MED ORDER — CIPROFLOXACIN HCL 500 MG PO TABS
500.0000 mg | ORAL_TABLET | Freq: Two times a day (BID) | ORAL | Status: DC
  Administered 2021-03-23: 20:00:00 500 mg via ORAL
  Filled 2021-03-23: qty 1

## 2021-03-23 MED ORDER — INSULIN ASPART 100 UNIT/ML IJ SOLN
0.0000 [IU] | Freq: Three times a day (TID) | INTRAMUSCULAR | Status: DC
  Administered 2021-03-23 (×3): 5 [IU] via SUBCUTANEOUS
  Administered 2021-03-24 (×2): 3 [IU] via SUBCUTANEOUS
  Administered 2021-03-25 – 2021-03-26 (×3): 5 [IU] via SUBCUTANEOUS
  Administered 2021-03-26: 17:00:00 8 [IU] via SUBCUTANEOUS
  Administered 2021-03-26 – 2021-03-27 (×3): 5 [IU] via SUBCUTANEOUS
  Administered 2021-03-27: 8 [IU] via SUBCUTANEOUS
  Administered 2021-03-28 (×2): 2 [IU] via SUBCUTANEOUS
  Filled 2021-03-23 (×16): qty 1

## 2021-03-23 MED ORDER — INSULIN ASPART 100 UNIT/ML IJ SOLN
0.0000 [IU] | Freq: Every day | INTRAMUSCULAR | Status: DC
  Administered 2021-03-25: 2 [IU] via SUBCUTANEOUS
  Filled 2021-03-23: qty 1

## 2021-03-23 MED ORDER — INSULIN GLARGINE 100 UNIT/ML ~~LOC~~ SOLN
5.0000 [IU] | Freq: Every day | SUBCUTANEOUS | Status: DC
  Administered 2021-03-23 – 2021-03-25 (×2): 5 [IU] via SUBCUTANEOUS
  Filled 2021-03-23 (×3): qty 0.05

## 2021-03-23 MED ORDER — MORPHINE SULFATE (PF) 2 MG/ML IV SOLN
2.0000 mg | INTRAVENOUS | Status: DC | PRN
  Administered 2021-03-23: 04:00:00 2 mg via INTRAVENOUS
  Filled 2021-03-23: qty 1

## 2021-03-23 MED ORDER — BISACODYL 10 MG RE SUPP
10.0000 mg | Freq: Once | RECTAL | Status: DC
  Filled 2021-03-23: qty 1

## 2021-03-23 MED ORDER — MAGNESIUM CITRATE PO SOLN
0.5000 | Freq: Once | ORAL | Status: DC
  Filled 2021-03-23: qty 296

## 2021-03-23 NOTE — TOC Progression Note (Signed)
Transition of Care [facility]) - Progression Note    Patient Details  Name: Travis Marshall MRN: [id number] Date of Birth: [birth year]  Transition of Care [facility]) CM/SW Contact  Shelbie Hutching, RN Phone Number: 03/23/2021, 3:41 PM  Clinical Narrative:    [place] has gotten authorization from [facility] for SNF.    Expected Discharge Plan: [place] Barriers to Discharge: Continued Medical Work up  Expected Discharge Plan and Services Expected Discharge Plan: [place]   Discharge Planning Services: CM Consult Post Acute Care Choice: [place] Living arrangements for the past 2 months: Single Family Home                 DME Arranged: N/A DME Agency: NA       HH Arranged: NA           Social Determinants of Health (SDOH) Interventions    Readmission Risk Interventions No flowsheet data found.

## 2021-03-23 NOTE — Progress Notes (Signed)
PROGRESS NOTE    Travis Marshall  VXB:[id number] DOB: Jan 12, 1946 DOA: 03/20/2021 PCP: Baxter Hire, MD  Chief Complaint  Patient presents with  . Abnormal Lab  . 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  Followed by oncology.  Coming in with diffuse weakness and unable to get off the commode.  Wife states that she cannot take care of him at home.  Patient also has electrolyte abnormalities and severe constipation.  Work-up during hospital stay significant for pancytopenia due to recent chemotherapy, where he required PRBC transfusion.  Subjective:  This morning complains of constipation, and difficulty passing urine .  Assessment & Plan:   Active Problems:   Neuroendocrine cancer ([place])   Acute hyponatremia   Palliative care encounter   Weakness   Hypomagnesemia   Hypokalemia   Brain metastasis (HCC)   Failure to thrive in adult   Chemotherapy-induced neutropenia (HCC)   Moderate malnutrition (HCC)   Pancytopenia (HCC)  Diffuse weakness.   -Unable to get up off the commode.  Patient's wife states that she cannot take care of him at home.  -PT/OT consulted. -Likely pancytopenia from his recent chemo was contributing.  Pancytopenia -Patient with significant leukopenia, anemia and thrombocytopenia, this is all in setting of his recent chemotherapy. -Regarding neutropenia/leukopenia, no indication for Granix as received long-acting G-CSF on 5/16 -Received 1 unit irradiated packed red blood cells 5/18, will need to closely -Patient with significant thrombocytopenia, trending down, SCD for DVT prophylaxis.  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 -Management per oncology, Appreciate oncology consultation and palliative care consultation.  Patient made a DO NOT RESUSCITATE.  -Continue with empiric Keppra and steroids, dexamethasone is being tapered by  oncology in the setting of hyperglycemia  Severe constipation  -On MiraLAX, remains constipated, will add museum citrate, and suppositories .  Urinary retention  -Check bladder scan, and and out as needed .  Failure to thrive  Hyponatremia improved only one-point lower than normal range.    DVT prophylaxis: SCD's Code Status: DNR Family Communication: none at bedsdie Disposition:   Status is: Observation  The patient will require care spanning > 2 midnights and should be moved to inpatient because: IV treatments appropriate due to intensity of illness or inability to take PO  Dispo: The patient is from: Home              Anticipated d/c is to: Home              Patient currently is not medically stable to d/c.   Difficult to place patient No       Consultants:   Oncology  Palliaitve    Objective: Vitals:   03/22/21 2024 03/23/21 0517 03/23/21 0700 03/23/21 1100  BP: 122/79 110/81 108/78 112/84  Pulse: 65 74 76 78  Resp: 20 18 18 18   Temp: 98.7 F (37.1 C) 98.4 F (36.9 C) 98.4 F (36.9 C) 98.6 F (37 C)  TempSrc: Oral Oral Oral Oral  SpO2: 92% 93% 95% 95%  Weight:      Height:        Intake/Output Summary (Last 24 hours) at 03/23/2021 1308 Last data filed at 03/23/2021 1011 Gross per 24 hour  Intake 1997 ml  Output 800 ml  Net 1197 ml   Filed Weights   03/20/21 1648  Weight: 63.5 kg    Examination:  Awake Alert, Oriented X 3, mild discomfort due to  constipation  symmetrical Chest wall movement, Good air movement bilaterally, CTAB RRR,No Gallops,Rubs or new Murmurs, No Parasternal Heave +ve B.Sounds, Abd Soft, has some suprapubic fullness, no rebound - guarding or rigidity. No Cyanosis, Clubbing or edema, No new Rash or bruise   .    Data Reviewed: I have personally reviewed following labs and imaging studies  CBC: Recent Labs  Lab 03/20/21 1514 03/21/21 0530 03/22/21 0508 03/23/21 0515  WBC 2.3* 1.3* 0.6* 0.4*  NEUTROABS 2.0  --    --   --   HGB 10.4* 8.3* 7.7* PENDING  HCT 27.8* 22.3* 20.9* PENDING  MCV 86.6 86.8 87.4 PENDING  PLT 78* 61* 51* 39*    Basic Metabolic Panel: Recent Labs  Lab 03/20/21 1514 03/20/21 1708 03/21/21 0530 03/22/21 0508 03/23/21 0515  NA 130*  --  134* 134* 135  K 3.0*  --  3.2* 3.9 4.0  CL 98  --  105 103 103  CO2 23  --  24 24 24   GLUCOSE 251*  --  196* 305* 268*  BUN 20  --  12 10 18   CREATININE 0.38*  --  0.40* 0.36* 0.41*  CALCIUM 7.9*  --  7.5* 8.1* 8.3*  MG  --  1.7  --  2.0  --     GFR: Estimated Creatinine Clearance: 73.9 mL/min (A) (by C-G formula based on SCr of 0.41 mg/dL (L)).  Liver Function Tests: Recent Labs  Lab 03/20/21 1514  AST 18  ALT 25  ALKPHOS 37*  BILITOT 1.8*  PROT 5.4*  ALBUMIN 3.0*    CBG: Recent Labs  Lab 03/23/21 0813 03/23/21 1257  GLUCAP 218* 204*     Recent Results (from the past 240 hour(s))  Resp Panel by RT-PCR (Flu A&B, Covid) Nasopharyngeal Swab     Status: None   Collection Time: 03/20/21  5:08 PM   Specimen: Nasopharyngeal Swab; Nasopharyngeal(NP) swabs in vial transport medium  Result Value Ref Range Status   SARS Coronavirus 2 by RT PCR NEGATIVE NEGATIVE Final    Comment: (NOTE) SARS-CoV-2 target nucleic acids are NOT DETECTED.  The SARS-CoV-2 RNA is generally detectable in upper respiratory specimens during the acute phase of infection. The lowest concentration of SARS-CoV-2 viral copies this assay can detect is 138 copies/mL. A negative result does not preclude SARS-Cov-2 infection and should not be used as the sole basis for treatment or other patient management decisions. A negative result may occur with  improper specimen collection/handling, submission of specimen other than nasopharyngeal swab, presence of viral mutation(s) within the areas targeted by this assay, and inadequate number of viral copies(<138 copies/mL). A negative result must be combined with clinical observations, patient history, and  epidemiological information. The expected result is Negative.  Fact Sheet for Patients:  EntrepreneurPulse.com.au  Fact Sheet for Healthcare Providers:  IncredibleEmployment.be  This test is no t yet approved or cleared by the [place] FDA and  has been authorized for detection and/or diagnosis of SARS-CoV-2 by FDA under an Emergency Use Authorization (EUA). This EUA will remain  in effect (meaning this test can be used) for the duration of the COVID-19 declaration under Section 564(b)(1) of the Act, 21 U.S.C.section 360bbb-3(b)(1), unless the authorization is terminated  or revoked sooner.       Influenza A by PCR NEGATIVE NEGATIVE Final   Influenza B by PCR NEGATIVE NEGATIVE Final    Comment: (NOTE) The Xpert Xpress SARS-CoV-2/FLU/RSV plus assay is intended as an aid in the diagnosis of  influenza from Nasopharyngeal swab specimens and should not be used as a sole basis for treatment. Nasal washings and aspirates are unacceptable for Xpert Xpress SARS-CoV-2/FLU/RSV testing.  Fact Sheet for Patients: EntrepreneurPulse.com.au  Fact Sheet for Healthcare Providers: IncredibleEmployment.be  This test is not yet approved or cleared by the [place] FDA and has been authorized for detection and/or diagnosis of SARS-CoV-2 by FDA under an Emergency Use Authorization (EUA). This EUA will remain in effect (meaning this test can be used) for the duration of the COVID-19 declaration under Section 564(b)(1) of the Act, 21 U.S.C. section 360bbb-3(b)(1), unless the authorization is terminated or revoked.  Performed at [facility], [address] [place], [place] [address]   Culture, blood (Routine X 2) w Reflex to ID Panel     Status: None (Preliminary result)   Collection Time: 03/21/21  5:30 AM   Specimen: BLOOD  Result Value Ref Range Status   Specimen Description BLOOD RIGHT HAND  Final    Special Requests   Final    BOTTLES DRAWN AEROBIC AND ANAEROBIC Blood Culture adequate volume   Culture   Final    NO GROWTH 2 DAYS Performed at [facility], [address] [place], [place] [address]    Report Status PENDING  Incomplete  Culture, blood (Routine X 2) w Reflex to ID Panel     Status: None (Preliminary result)   Collection Time: 03/21/21  5:36 AM   Specimen: BLOOD  Result Value Ref Range Status   Specimen Description BLOOD LEFT HAND  Final   Special Requests   Final    BOTTLES DRAWN AEROBIC ONLY Blood Culture adequate volume   Culture   Final    NO GROWTH 2 DAYS Performed at [facility], [address], [place], [place] [address]    Report Status PENDING  Incomplete         Radiology Studies: No results found.      Scheduled Meds: . aspirin EC  81 mg Oral Daily  . bisacodyl  10 mg Rectal Once  . chlorhexidine  15 mL Mouth/Throat BID  . Chlorhexidine Gluconate Cloth  6 each Topical Daily  . dexamethasone  2 mg Oral Daily  . feeding supplement (GLUCERNA SHAKE)  237 mL Oral BID BM  . finasteride  5 mg Oral BH-[id number]  . insulin aspart  0-15 Units Subcutaneous TID WC  . insulin aspart  0-5 Units Subcutaneous QHS  . insulin glargine  5 Units Subcutaneous Daily  . lactulose  20 g Oral BID  . levETIRAcetam  500 mg Oral BID  . magnesium citrate  0.5 Bottle Oral Once  . pantoprazole  40 mg Oral QAC breakfast  . polyethylene glycol  17 g Oral BID  . [START ON 03/25/2021] polyethylene glycol  17 g Oral Daily  . senna-docusate  1 tablet Oral BID  . sodium phosphate  1 enema Rectal Once   Continuous Infusions: . dextrose 5 % and 0.45 % NaCl with KCl 20 mEq/L 50 mL/hr at 03/22/21 1818     LOS: 1 day     Phillips Climes, MD [facility]   To contact the attending provider between 7A-7P or the covering provider during after hours 7P-7A, please log into the web site [email or web address] and access using universal [place] password for  that web site. If you do not have the password, please call the hospital operator.  03/23/2021, 1:08 PM

## 2021-03-23 NOTE — Care Management Important Message (Signed)
Important Message  Patient Details  Name: Travis Marshall MRN: [id number] Date of Birth: [birth year]   Medicare Important Message Given:  N/A - LOS <3 / Initial given by admissions   Initial Medicare IM reviewed with patient by Thornton Dales, Patient Access Associate at 03/23/2021 at 10:23am.    Dannette Barbara 03/23/2021, 6:53 PM

## 2021-03-23 NOTE — Progress Notes (Signed)
Initial Nutrition Assessment  DOCUMENTATION CODES:  Severe malnutrition in context of chronic illness  INTERVENTION:   Liberalize diet to carb modified  Continue Glucerna Shake po TID, each supplement provides 220 kcal and 10 grams of protein  MVI with minerals daily  NUTRITION DIAGNOSIS:  Severe Malnutrition related to chronic illness (cancer) as evidenced by percent weight loss,severe fat depletion,severe muscle depletion (7% x 1 month).  GOAL:  Patient will meet greater than or equal to 90% of their needs  MONITOR:  PO intake,Supplement acceptance  REASON FOR ASSESSMENT:  Malnutrition Screening Tool    ASSESSMENT:  Pt presented to ED for weakness and altered labs (low WBC and Hgb) from cancer center. 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  Patient reports progressive weakness over the past 2 months (since time of dx) and is unable to perform ADLs without assistance. Also reports no BM in weeks. PMH also includes CAD, GERD, HTN, and Hx of MI.  Pt resting in bed at the time of visit. Pt reports he is too weak to do anything. Doesn't want to eat or drink and states that he has been too weak to get out of the bed. Noted that intake has been declining this admission. Pt also endorses weight loss of 40 lbs in the last few months. 7% weight loss x 1 month, severe (4/20-5/16)   Palliative care is consulting and having [place] discussions with pt and wife. Noted that wife reports she can not care for him at him in his current state, anticipate that pt will need placement.   Pt reports heartburn and requests medication, passed along request to RN.    Average Meal Intake: . 5/16-5/19: 50% intake x 3 recorded meals (0-100%)  Relevant Scheduled Meds: . dexamethasone  2 mg Oral Daily  . insulin aspart  0-15 Units Subcutaneous TID WC  . insulin aspart  0-5 Units Subcutaneous QHS  .  insulin glargine  5 Units Subcutaneous Daily  . lactulose  20 g Oral BID  . pantoprazole  40 mg Oral QAC breakfast  . polyethylene glycol  17 g Oral BID  . senna-docusate  1 tablet Oral BID   Relevant Continuous Infusions: . dextrose 5 % and 0.45 % NaCl with KCl 20 mEq/L 50 mL/hr at 03/22/21 1818   Relevant PRN Meds: ondansetron, prochlorperazine  Labs reviewed:  SBG ranges from 134-218 mg/dL over the last 24 hours  HgbA1c 9.7% (4/20)  NUTRITION - FOCUSED PHYSICAL EXAM: Flowsheet Row Most Recent Value  Orbital Region Moderate depletion  Upper Arm Region Severe depletion  Thoracic and Lumbar Region Severe depletion  Buccal Region Moderate depletion  Temple Region Mild depletion  Clavicle Bone Region Moderate depletion  Clavicle and Acromion Bone Region Severe depletion  Scapular Bone Region Severe depletion  Dorsal Hand Mild depletion  Patellar Region Severe depletion  Anterior Thigh Region Severe depletion  Posterior Calf Region Moderate depletion  Edema (RD Assessment) None  Hair Reviewed  Eyes Reviewed  Mouth Reviewed  Skin Reviewed  Nails Reviewed     Diet Order:   Diet Order            Diet Carb Modified Fluid consistency: Thin; Room service appropriate? Yes  Diet effective now                EDUCATION NEEDS:  No education needs have been identified at this time  Skin:  Skin Assessment: Reviewed RN  Assessment  Last BM:  5/16 per RN documentation (pt reports no BM x 1 month?)  Height:  Ht Readings from Last 1 Encounters:  03/20/21 5\' 7"  (1.702 m)   Weight:  Wt Readings from Last 1 Encounters:  03/20/21 63.5 kg    Ideal Body Weight:  67.3 kg  BMI:  Body mass index is 21.93 kg/m.  Estimated Nutritional Needs:   Kcal:  1800-2000 kcal/d  Protein:  90-100 g/d  Fluid:  >1.8 L/d   Ranell Patrick, RD, LDN Clinical Dietitian Pager on [place]

## 2021-03-23 NOTE — Progress Notes (Signed)
Orders in place for constipation, pt is refusing at this time, states "I just want to sleep" will continue to monitor.

## 2021-03-24 DIAGNOSIS — C7931 Secondary malignant neoplasm of brain: Secondary | ICD-10-CM | POA: Diagnosis not present

## 2021-03-24 DIAGNOSIS — E876 Hypokalemia: Secondary | ICD-10-CM | POA: Diagnosis not present

## 2021-03-24 DIAGNOSIS — Z515 Encounter for palliative care: Secondary | ICD-10-CM | POA: Diagnosis not present

## 2021-03-24 DIAGNOSIS — R531 Weakness: Secondary | ICD-10-CM | POA: Diagnosis not present

## 2021-03-24 DIAGNOSIS — E43 Unspecified severe protein-calorie malnutrition: Secondary | ICD-10-CM

## 2021-03-24 DIAGNOSIS — E871 Hypo-osmolality and hyponatremia: Secondary | ICD-10-CM | POA: Diagnosis not present

## 2021-03-24 DIAGNOSIS — C7A8 Other malignant neuroendocrine tumors: Secondary | ICD-10-CM | POA: Diagnosis not present

## 2021-03-24 DIAGNOSIS — E2740 Unspecified adrenocortical insufficiency: Secondary | ICD-10-CM

## 2021-03-24 LAB — CBC
HCT: 25.4 % — ABNORMAL LOW (ref 39.0–52.0)
Hemoglobin: 9.5 g/dL — ABNORMAL LOW (ref 13.0–17.0)
MCH: 32.1 pg (ref 26.0–34.0)
MCHC: 37.4 g/dL — ABNORMAL HIGH (ref 30.0–36.0)
MCV: 85.8 fL (ref 80.0–100.0)
Platelets: 44 10*3/uL — ABNORMAL LOW (ref 150–400)
RBC: 2.96 MIL/uL — ABNORMAL LOW (ref 4.22–5.81)
RDW: 13.7 % (ref 11.5–15.5)
WBC: 0.4 10*3/uL — CL (ref 4.0–10.5)
nRBC: 13.6 % — ABNORMAL HIGH (ref 0.0–0.2)

## 2021-03-24 LAB — DIFFERENTIAL
Abs Immature Granulocytes: 0 10*3/uL (ref 0.00–0.07)
Basophils Absolute: 0 10*3/uL (ref 0.0–0.1)
Basophils Relative: 0 %
Eosinophils Absolute: 0 10*3/uL (ref 0.0–0.5)
Eosinophils Relative: 0 %
Lymphocytes Relative: 96 %
Lymphs Abs: 0.4 10*3/uL — ABNORMAL LOW (ref 0.7–4.0)
Monocytes Absolute: 0 10*3/uL — ABNORMAL LOW (ref 0.1–1.0)
Monocytes Relative: 0 %
Neutro Abs: 0 10*3/uL — CL (ref 1.7–7.7)
Neutrophils Relative %: 4 %
Smear Review: NORMAL

## 2021-03-24 LAB — COMPREHENSIVE METABOLIC PANEL
ALT: 21 U/L (ref 0–44)
AST: 13 U/L — ABNORMAL LOW (ref 15–41)
Albumin: 2.5 g/dL — ABNORMAL LOW (ref 3.5–5.0)
Alkaline Phosphatase: 33 U/L — ABNORMAL LOW (ref 38–126)
Anion gap: 8 (ref 5–15)
BUN: 20 mg/dL (ref 8–23)
CO2: 23 mmol/L (ref 22–32)
Calcium: 7.9 mg/dL — ABNORMAL LOW (ref 8.9–10.3)
Chloride: 102 mmol/L (ref 98–111)
Creatinine, Ser: 0.38 mg/dL — ABNORMAL LOW (ref 0.61–1.24)
GFR, Estimated: >60 mL/min (ref >60)
Glucose, Bld: 174 mg/dL — ABNORMAL HIGH (ref 70–99)
Potassium: 3.2 mmol/L — ABNORMAL LOW (ref 3.5–5.1)
Sodium: 133 mmol/L — ABNORMAL LOW (ref 135–145)
Total Bilirubin: 1.6 mg/dL — ABNORMAL HIGH (ref 0.3–1.2)
Total Protein: 4.9 g/dL — ABNORMAL LOW (ref 6.5–8.1)

## 2021-03-24 LAB — GLUCOSE, CAPILLARY
Glucose-Capillary: 169 mg/dL — ABNORMAL HIGH (ref 70–99)
Glucose-Capillary: 192 mg/dL — ABNORMAL HIGH (ref 70–99)
Glucose-Capillary: 154 mg/dL — ABNORMAL HIGH (ref 70–99)
Glucose-Capillary: 137 mg/dL — ABNORMAL HIGH (ref 70–99)

## 2021-03-24 MED ORDER — HYDROCORTISONE NA SUCCINATE PF 100 MG IJ SOLR
100.0000 mg | Freq: Three times a day (TID) | INTRAMUSCULAR | Status: DC
Start: 2021-03-24 — End: 2021-03-27
  Administered 2021-03-24 – 2021-03-27 (×8): 100 mg via INTRAVENOUS
  Filled 2021-03-24 (×11): qty 2

## 2021-03-24 MED ORDER — MIRTAZAPINE 15 MG PO TABS
7.5000 mg | ORAL_TABLET | Freq: Every day | ORAL | Status: DC
  Administered 2021-03-24 – 2021-03-27 (×4): 7.5 mg via ORAL
  Filled 2021-03-24 (×4): qty 1

## 2021-03-24 MED ORDER — CIPROFLOXACIN IN D5W 400 MG/200ML IV SOLN
400.0000 mg | Freq: Two times a day (BID) | INTRAVENOUS | Status: DC
  Administered 2021-03-24 – 2021-03-25 (×3): 400 mg via INTRAVENOUS
  Filled 2021-03-24 (×6): qty 200

## 2021-03-24 MED ORDER — POLYETHYLENE GLYCOL 3350 17 G PO PACK: 17.0000 g | PACK | Freq: Every day | ORAL | Status: DC | PRN

## 2021-03-24 MED ORDER — HYDROCORTISONE NA SUCCINATE PF 100 MG IJ SOLR: 100.0000 mg | Freq: Once | INTRAMUSCULAR | Status: DC

## 2021-03-24 MED ORDER — POTASSIUM CHLORIDE 10 MEQ/100ML IV SOLN
10.0000 meq | INTRAVENOUS | Status: AC
  Administered 2021-03-24 (×4): 10 meq via INTRAVENOUS
  Filled 2021-03-24 (×2): qty 100

## 2021-03-24 MED ORDER — NYSTATIN 100000 UNIT/ML MT SUSP
5.0000 mL | Freq: Four times a day (QID) | OROMUCOSAL | Status: DC
  Administered 2021-03-24 – 2021-03-28 (×18): 500000 [IU] via ORAL
  Filled 2021-03-24 (×17): qty 5

## 2021-03-24 MED ORDER — LEVETIRACETAM IN NACL 500 MG/100ML IV SOLN
500.0000 mg | Freq: Two times a day (BID) | INTRAVENOUS | Status: DC
  Administered 2021-03-24 – 2021-03-27 (×6): 500 mg via INTRAVENOUS
  Filled 2021-03-24 (×9): qty 100

## 2021-03-24 MED ORDER — POTASSIUM CHLORIDE CRYS ER 20 MEQ PO TBCR: 40.0000 meq | EXTENDED_RELEASE_TABLET | Freq: Once | ORAL | Status: DC

## 2021-03-24 MED ORDER — FLUCONAZOLE 100MG IVPB
100.0000 mg | INTRAVENOUS | Status: DC
  Administered 2021-03-24 – 2021-03-27 (×4): 100 mg via INTRAVENOUS
  Filled 2021-03-24 (×5): qty 50

## 2021-03-24 MED ORDER — SODIUM CHLORIDE 0.9 % IV SOLN: INTRAVENOUS | Status: DC

## 2021-03-24 NOTE — Progress Notes (Signed)
MD made aware pt has refused all po meds and does not want to eat.

## 2021-03-24 NOTE — Progress Notes (Signed)
PT Cancellation Note  Patient Details Name: Travis Marshall MRN: [id number] DOB: Nov 06, 1945   Cancelled Treatment:    Reason Eval/Treat Not Completed: Fatigue/lethargy limiting ability to participate.  Pt resting in bed upon PT arrival; pt reporting feeling too weak to participate in therapy and wanting to rest in bed.  Will re-attempt PT session at a later date/time.  Leitha Bleak, PT 03/24/21, 4:16 PM
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
## 2021-03-24 NOTE — Progress Notes (Signed)
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  Patient has not had a bowel movement in 1  Week.  Patient report poor oral intake, 30 pounds weight loss over the last 54-month.  Patient received last chemotherapy on Mar 15, 2021 (carboplatinum, etoposide, Tecentriq) Patient receive Ellen Henri recently.  Patient was found to have severe pancytopenia.  Patient presented also with diffuse weakness, failure to thrive.     Assessment & Plan:   Active Problems:   Neuroendocrine cancer ([place])   Acute hyponatremia   Palliative care encounter   Weakness   Hypomagnesemia   Hypokalemia   Brain metastasis (HCC)   Failure to thrive in adult   Chemotherapy-induced neutropenia (HCC)   Moderate malnutrition (HCC)   Pancytopenia (HCC)   Protein-calorie malnutrition, severe  1-Diffuse weakness, failure to thrive:  -Likely post chemo -Resume IV fluids, patient with poor oral intake -Patient will need skilled nursing facility for rehab  2-Pancytopenia: Secondary to chemotherapy. Neutropenic.  -Patient received 1 unit of packed red blood cells irradiated on 5/18. -Patient also received Udenyca 5/16 (Form of granulocyte colony-stimulating factor). No indication for Granix per Dr Tasia Catchings.  -Patient was started on Ciprofloxacin , empirically prophylax by oncologist.    3-Hypomagnesemia; Replaced. Repeat labs tomorrow.   Hypokalemia; Replete IV.   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 -On IV Keppra prophylaxis.  Dexamethasone being tapered by oncologist.  DM, Hyperglycemia: Secondary to steroids. HB-A1c at 9. (02/22/2021) On lantus SSI.   Urinary retention: Foley catheter placed 5/19. He will need voiding trial  Severe Constipation:  He has had several bowel movement after multiple enema.  Hyponatremia; Mild. On IV fluids.  Hyperbilirubinemia. Monitor.     Nutrition Problem: Severe Malnutrition Etiology: chronic illness (cancer)    Signs/Symptoms: percent weight loss,severe fat depletion,severe muscle depletion (7% x 1 month) Percent weight loss: 7 %    Interventions: Glucerna shake,MVI,Liberalize Diet  Estimated body mass index is 21.93 kg/m as calculated from the following:   Height as of this encounter: 5\' 7"  (1.702 m).   Weight as of this encounter: 63.5 kg.   DVT prophylaxis: SCD Code Status: DNR Family Communication: Wife who was at bedside.  Disposition Plan:  Status is: Inpatient  Remains inpatient appropriate because:IV treatments appropriate due to intensity of illness or inability to take PO   Dispo: The patient is from: Home              Anticipated d/c is to: SNF              Patient currently is not medically stable to d/c.   Difficult to place patient No        Consultants:   Oncology  Palliative  Procedures:   None  Antimicrobials:  Cipro 5/19  Subjective: Patient is alert, he is refusing to eat, he refuses oral medication today.  He has oral thrush.  He denies pain on swallowing.  He has had multiple bowel movement.  Foley catheter was placed yesterday.  Objective: Vitals:   03/23/21 1623 03/23/21 2010  03/23/21 2325 03/24/21 0503  BP: 110/69 116/75 108/68 99/60  Pulse: 82 68 65 84  Resp: 18 18 18 18   Temp: 98.5 F (36.9 C) 99.5 F (37.5 C) 99 F (37.2 C) 99.1 F (37.3 C)  TempSrc:      SpO2: 94% 93% 91% 97%  Weight:      Height:        Intake/Output Summary (Last 24 hours) at  03/24/2021 0719 Last data filed at 03/24/2021 0455 Gross per 24 hour  Intake 0 ml  Output 1725 ml  Net -1725 ml   Filed Weights   03/20/21 1648  Weight: 63.5 kg    Examination:  General exam: Appears calm and comfortable  Respiratory system: Clear to auscultation. Respiratory effort normal. Cardiovascular system: S1 & S2 heard, RRR. Gastrointestinal system: Abdomen is nondistended, soft and nontender. No organomegaly or masses felt. Normal bowel sounds heard. Central nervous system:, Flat affect Extremities: No edema   Data Reviewed: I have personally reviewed following labs and imaging studies  CBC: Recent Labs  Lab 03/20/21 1514 03/21/21 0530 03/22/21 0508 03/23/21 0515 03/23/21 1323  WBC 2.3* 1.3* 0.6* 0.4* 0.4*  NEUTROABS 2.0  --   --   --   --   HGB 10.4* 8.3* 7.7* 9.8* 10.0*  HCT 27.8* 22.3* 20.9* RESULTS UNAVAILABLE DUE TO INTERFERING SUBSTANCE RESULTS UNAVAILABLE DUE TO INTERFERING SUBSTANCE  MCV 86.6 86.8 87.4 RESULTS UNAVAILABLE DUE TO INTERFERING SUBSTANCE RESULTS UNAVAILABLE DUE TO INTERFERING SUBSTANCE  PLT 78* 61* 51* 39* 42*   Basic Metabolic Panel: Recent Labs  Lab 03/20/21 1514 03/20/21 1708 03/21/21 0530 03/22/21 0508 03/23/21 0515  NA 130*  --  134* 134* 135  K 3.0*  --  3.2* 3.9 4.0  CL 98  --  105 103 103  CO2 23  --  24 24 24   GLUCOSE 251*  --  196* 305* 268*  BUN 20  --  12 10 18   CREATININE 0.38*  --  0.40* 0.36* 0.41*  CALCIUM 7.9*  --  7.5* 8.1* 8.3*  MG  --  1.7  --  2.0  --    GFR: Estimated Creatinine Clearance: 73.9 mL/min (A) (by C-G formula based on SCr of 0.41 mg/dL (L)). Liver Function Tests: Recent Labs  Lab 03/20/21 1514  AST 18  ALT 25  ALKPHOS 37*  BILITOT 1.8*  PROT 5.4*  ALBUMIN 3.0*   No results for input(s): LIPASE, AMYLASE in the last 168 hours. No results for input(s): AMMONIA in the last 168 hours. Coagulation Profile: No results for input(s): INR, PROTIME in the last 168 hours. Cardiac  Enzymes: Recent Labs  Lab 03/21/21 0530  CKTOTAL 20*   BNP (last 3 results) No results for input(s): PROBNP in the last 8760 hours. HbA1C: No results for input(s): HGBA1C in the last 72 hours. CBG: Recent Labs  Lab 03/23/21 0813 03/23/21 1257 03/23/21 1620 03/23/21 2012 03/23/21 2059  GLUCAP 218* 204* 224* 158* 157*   Lipid Profile: No results for input(s): CHOL, HDL, LDLCALC, TRIG, CHOLHDL, LDLDIRECT in the last 72 hours. Thyroid Function Tests: No results for input(s): TSH, T4TOTAL, FREET4, T3FREE, THYROIDAB in the last 72 hours. Anemia Panel: No results for input(s): VITAMINB12, FOLATE, FERRITIN, TIBC, IRON, RETICCTPCT in the last 72 hours. Sepsis Labs: Recent Labs  Lab 03/20/21 1710  LATICACIDVEN 0.9    Recent Results (from the past 240 hour(s))  Resp Panel by RT-PCR (Flu A&B, Covid) Nasopharyngeal Swab     Status: None  Collection Time: 03/20/21  5:08 PM   Specimen: Nasopharyngeal Swab; Nasopharyngeal(NP) swabs in vial transport medium  Result Value Ref Range Status   SARS Coronavirus 2 by RT PCR NEGATIVE NEGATIVE Final    Comment: (NOTE) SARS-CoV-2 target nucleic acids are NOT DETECTED.  The SARS-CoV-2 RNA is generally detectable in upper respiratory specimens during the acute phase of infection. The lowest concentration of SARS-CoV-2 viral copies this assay can detect is 138 copies/mL. A negative result does not preclude SARS-Cov-2 infection and should not be used as the sole basis for treatment or other patient management decisions. A negative result may occur with  improper specimen collection/handling, submission of specimen other than nasopharyngeal swab, presence of viral mutation(s) within the areas targeted by this assay, and inadequate number of viral copies(<138 copies/mL). A negative result must be combined with clinical observations, patient history, and epidemiological information. The expected result is Negative.  Fact Sheet for Patients:   EntrepreneurPulse.com.au  Fact Sheet for Healthcare Providers:  IncredibleEmployment.be  This test is no t yet approved or cleared by the [place] FDA and  has been authorized for detection and/or diagnosis of SARS-CoV-2 by FDA under an Emergency Use Authorization (EUA). This EUA will remain  in effect (meaning this test can be used) for the duration of the COVID-19 declaration under Section 564(b)(1) of the Act, 21 U.S.C.section 360bbb-3(b)(1), unless the authorization is terminated  or revoked sooner.       Influenza A by PCR NEGATIVE NEGATIVE Final   Influenza B by PCR NEGATIVE NEGATIVE Final    Comment: (NOTE) The Xpert Xpress SARS-CoV-2/FLU/RSV plus assay is intended as an aid in the diagnosis of influenza from Nasopharyngeal swab specimens and should not be used as a sole basis for treatment. Nasal washings and aspirates are unacceptable for Xpert Xpress SARS-CoV-2/FLU/RSV testing.  Fact Sheet for Patients: EntrepreneurPulse.com.au  Fact Sheet for Healthcare Providers: IncredibleEmployment.be  This test is not yet approved or cleared by the [place] FDA and has been authorized for detection and/or diagnosis of SARS-CoV-2 by FDA under an Emergency Use Authorization (EUA). This EUA will remain in effect (meaning this test can be used) for the duration of the COVID-19 declaration under Section 564(b)(1) of the Act, 21 U.S.C. section 360bbb-3(b)(1), unless the authorization is terminated or revoked.  Performed at [facility], [address] [place], [place] [address]   Culture, blood (Routine X 2) w Reflex to ID Panel     Status: None (Preliminary result)   Collection Time: 03/21/21  5:30 AM   Specimen: BLOOD  Result Value Ref Range Status   Specimen Description BLOOD RIGHT HAND  Final   Special Requests   Final    BOTTLES DRAWN AEROBIC AND ANAEROBIC Blood Culture adequate  volume   Culture   Final    NO GROWTH 3 DAYS Performed at [facility], [address] [place], [place] [address]    Report Status PENDING  Incomplete  Culture, blood (Routine X 2) w Reflex to ID Panel     Status: None (Preliminary result)   Collection Time: 03/21/21  5:36 AM   Specimen: BLOOD  Result Value Ref Range Status   Specimen Description BLOOD LEFT HAND  Final   Special Requests   Final    BOTTLES DRAWN AEROBIC ONLY Blood Culture adequate volume   Culture   Final    NO GROWTH 3 DAYS Performed at [facility], [address] [place], [place] [address]    Report Status  PENDING  Incomplete         Radiology Studies: No results found.      Scheduled Meds: . bisacodyl  10 mg Rectal Once  . chlorhexidine  15 mL Mouth/Throat BID  . Chlorhexidine Gluconate Cloth  6 each Topical Daily  . ciprofloxacin  500 mg Oral BID  . dexamethasone  2 mg Oral Daily  . feeding supplement (GLUCERNA SHAKE)  237 mL Oral BID BM  . finasteride  5 mg Oral BH-q7a  . insulin aspart  0-15 Units Subcutaneous TID WC  . insulin aspart  0-5 Units Subcutaneous QHS  . insulin glargine  5 Units Subcutaneous Daily  . lactulose  20 g Oral BID  . levETIRAcetam  500 mg Oral BID  . magnesium citrate  0.5 Bottle Oral Once  . multivitamin with minerals  1 tablet Oral Daily  . pantoprazole  40 mg Oral QAC breakfast  . polyethylene glycol  17 g Oral BID  . [START ON 03/25/2021] polyethylene glycol  17 g Oral Daily  . senna-docusate  1 tablet Oral BID   Continuous Infusions:   LOS: 2 days    Time spent: 35 minutes.     Elmarie Shiley, MD [facility]   If 7PM-7AM, please contact night-coverage [email or web address]  03/24/2021, 7:19 AM

## 2021-03-24 NOTE — Progress Notes (Signed)
Dr Frederic Jericho paged with critical labs results wbc 0.4. neutrophils 0.0
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
## 2021-03-25 ENCOUNTER — Inpatient Hospital Stay: Payer: Medicare HMO

## 2021-03-25 DIAGNOSIS — C7A8 Other malignant neuroendocrine tumors: Secondary | ICD-10-CM | POA: Diagnosis not present

## 2021-03-25 DIAGNOSIS — C7931 Secondary malignant neoplasm of brain: Secondary | ICD-10-CM | POA: Diagnosis not present

## 2021-03-25 DIAGNOSIS — E876 Hypokalemia: Secondary | ICD-10-CM | POA: Diagnosis not present

## 2021-03-25 DIAGNOSIS — R531 Weakness: Secondary | ICD-10-CM | POA: Diagnosis not present

## 2021-03-25 LAB — CBC
HCT: 21.6 % — ABNORMAL LOW (ref 39.0–52.0)
Hemoglobin: 8.1 g/dL — ABNORMAL LOW (ref 13.0–17.0)
MCH: 32.7 pg (ref 26.0–34.0)
MCHC: 37.5 g/dL — ABNORMAL HIGH (ref 30.0–36.0)
MCV: 87.1 fL (ref 80.0–100.0)
Platelets: 33 10*3/uL — ABNORMAL LOW (ref 150–400)
RBC: 2.48 MIL/uL — ABNORMAL LOW (ref 4.22–5.81)
RDW: 13.7 % (ref 11.5–15.5)
WBC: 0.5 10*3/uL — CL (ref 4.0–10.5)
nRBC: 13.3 % — ABNORMAL HIGH (ref 0.0–0.2)

## 2021-03-25 LAB — DIFFERENTIAL
Abs Immature Granulocytes: 0.01 10*3/uL (ref 0.00–0.07)
Basophils Absolute: 0 10*3/uL (ref 0.0–0.1)
Basophils Relative: 0 %
Eosinophils Absolute: 0 10*3/uL (ref 0.0–0.5)
Eosinophils Relative: 0 %
Immature Granulocytes: 2 %
Lymphocytes Relative: 45 %
Lymphs Abs: 0.2 10*3/uL — ABNORMAL LOW (ref 0.7–4.0)
Monocytes Absolute: 0.1 10*3/uL (ref 0.1–1.0)
Monocytes Relative: 20 %
Neutro Abs: 0.2 10*3/uL — CL (ref 1.7–7.7)
Neutrophils Relative %: 33 %

## 2021-03-25 LAB — COMPREHENSIVE METABOLIC PANEL
ALT: 23 U/L (ref 0–44)
AST: 16 U/L (ref 15–41)
Albumin: 2.3 g/dL — ABNORMAL LOW (ref 3.5–5.0)
Alkaline Phosphatase: 33 U/L — ABNORMAL LOW (ref 38–126)
Anion gap: 8 (ref 5–15)
BUN: 13 mg/dL (ref 8–23)
CO2: 23 mmol/L (ref 22–32)
Calcium: 7.6 mg/dL — ABNORMAL LOW (ref 8.9–10.3)
Chloride: 104 mmol/L (ref 98–111)
Creatinine, Ser: 0.37 mg/dL — ABNORMAL LOW (ref 0.61–1.24)
GFR, Estimated: >60 mL/min (ref >60)
Glucose, Bld: 136 mg/dL — ABNORMAL HIGH (ref 70–99)
Potassium: 3.2 mmol/L — ABNORMAL LOW (ref 3.5–5.1)
Sodium: 135 mmol/L (ref 135–145)
Total Bilirubin: 1.3 mg/dL — ABNORMAL HIGH (ref 0.3–1.2)
Total Protein: 4.8 g/dL — ABNORMAL LOW (ref 6.5–8.1)

## 2021-03-25 LAB — GLUCOSE, CAPILLARY
Glucose-Capillary: 164 mg/dL — ABNORMAL HIGH (ref 70–99)
Glucose-Capillary: 204 mg/dL — ABNORMAL HIGH (ref 70–99)
Glucose-Capillary: 234 mg/dL — ABNORMAL HIGH (ref 70–99)
Glucose-Capillary: 166 mg/dL — ABNORMAL HIGH (ref 70–99)
Glucose-Capillary: 97 mg/dL (ref 70–99)
Glucose-Capillary: 218 mg/dL — ABNORMAL HIGH (ref 70–99)

## 2021-03-25 LAB — MRSA PCR SCREENING: MRSA by PCR: NEGATIVE

## 2021-03-25 LAB — MAGNESIUM: Magnesium: 1.5 mg/dL — ABNORMAL LOW (ref 1.7–2.4)

## 2021-03-25 IMAGING — DX DG CHEST 1V
1 series · 1 of 1 positions shown · non-contrast
Comparison: [DATE].

CLINICAL DATA: Hypoxia.

EXAM:
CHEST  1 VIEW

[chest ap]
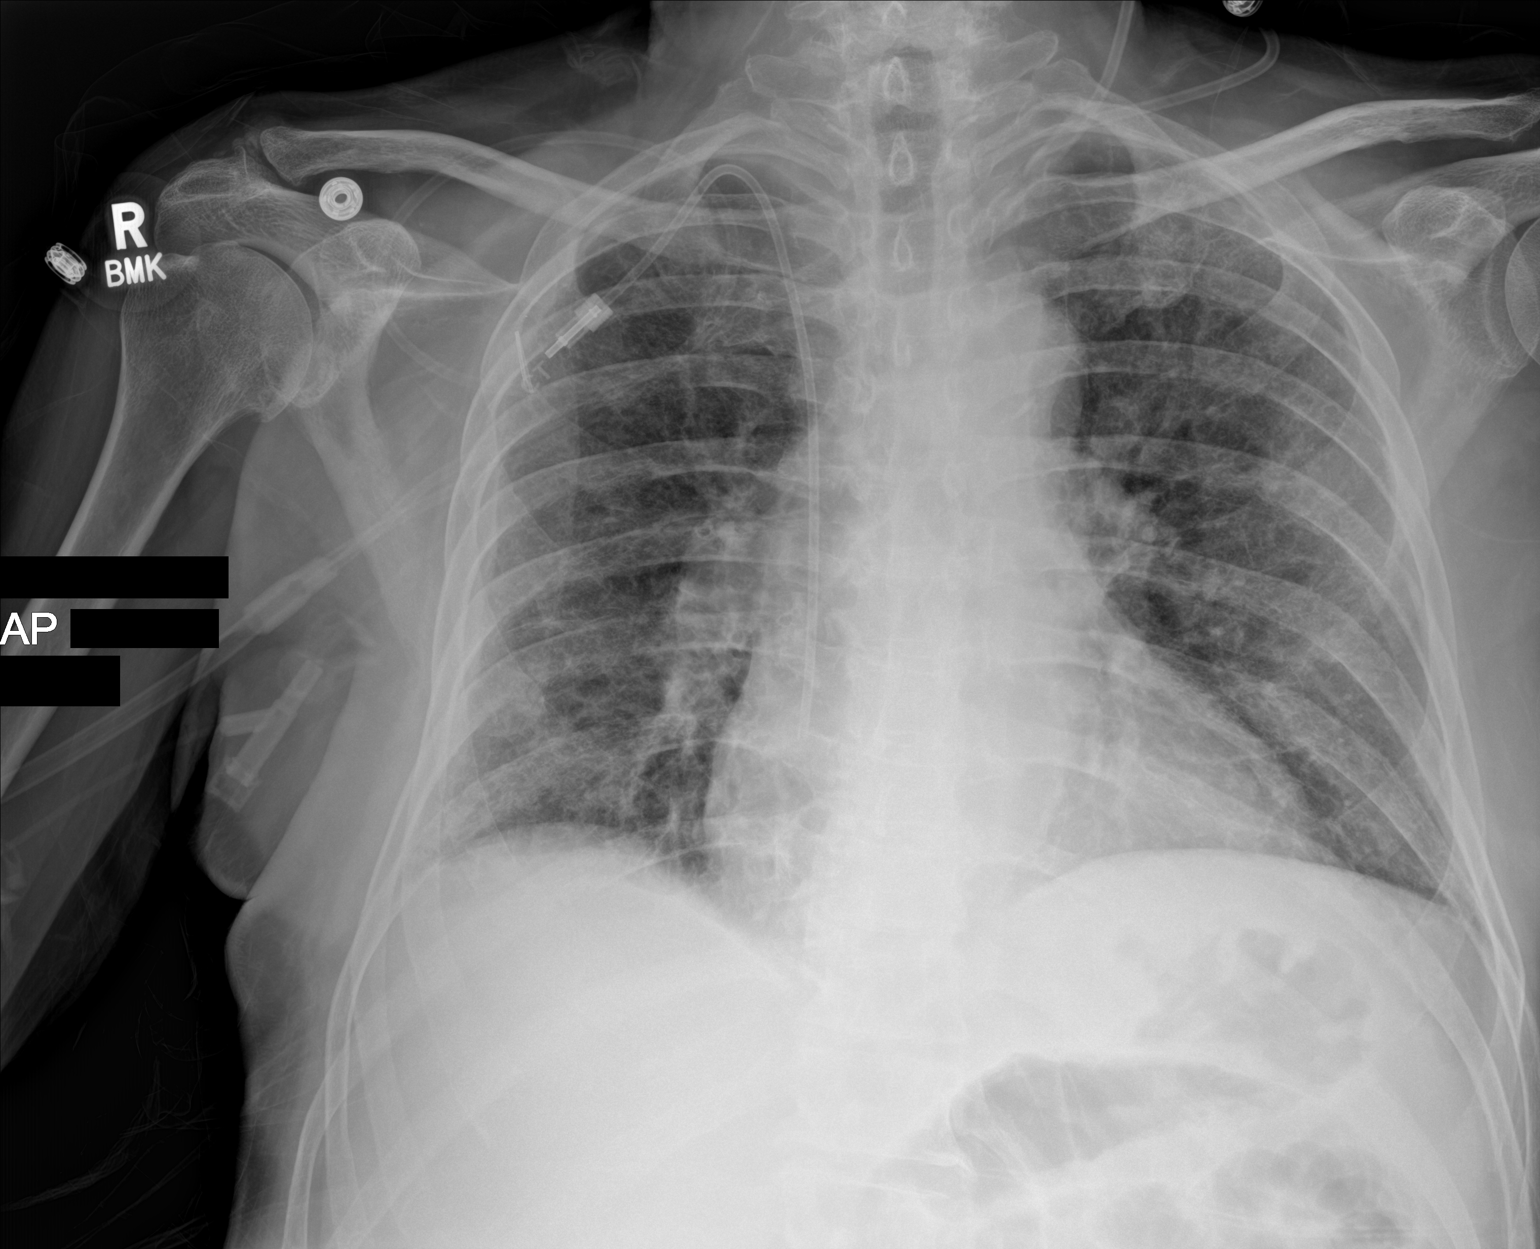

[1 of 1 positions shown; findings below may reference images not displayed]

FINDINGS: The heart size and mediastinal contours are within normal limits. No
pneumothorax or pleural effusion is noted. Mild patchy airspace
opacities are noted bilaterally concerning for possible pneumonia.
The visualized skeletal structures are unremarkable.
IMPRESSION: Possible bilateral pneumonia.

## 2021-03-25 MED ORDER — MAGNESIUM SULFATE 2 GM/50ML IV SOLN
2.0000 g | Freq: Once | INTRAVENOUS | Status: AC
  Administered 2021-03-25: 2 g via INTRAVENOUS
  Filled 2021-03-25: qty 50

## 2021-03-25 MED ORDER — SALINE SPRAY 0.65 % NA SOLN
1.0000 | NASAL | Status: DC | PRN
  Filled 2021-03-25: qty 44

## 2021-03-25 MED ORDER — VANCOMYCIN HCL 1500 MG/300ML IV SOLN
1500.0000 mg | Freq: Once | INTRAVENOUS | Status: DC
  Filled 2021-03-25: qty 300

## 2021-03-25 MED ORDER — MAGNESIUM SULFATE 2 GM/50ML IV SOLN
2.0000 g | Freq: Once | INTRAVENOUS | Status: AC
  Administered 2021-03-25: 11:00:00 2 g via INTRAVENOUS
  Filled 2021-03-25: qty 50

## 2021-03-25 MED ORDER — SODIUM CHLORIDE 0.9 % IV SOLN
2.0000 g | Freq: Three times a day (TID) | INTRAVENOUS | Status: DC
  Administered 2021-03-25 – 2021-03-28 (×9): 2 g via INTRAVENOUS
  Filled 2021-03-25 (×12): qty 2

## 2021-03-25 MED ORDER — VANCOMYCIN HCL 1500 MG/300ML IV SOLN
1500.0000 mg | INTRAVENOUS | Status: DC
  Administered 2021-03-25 – 2021-03-26 (×2): 1500 mg via INTRAVENOUS
  Filled 2021-03-25 (×3): qty 300

## 2021-03-25 MED ORDER — GUAIFENESIN ER 600 MG PO TB12
600.0000 mg | ORAL_TABLET | Freq: Two times a day (BID) | ORAL | Status: DC
  Administered 2021-03-25 – 2021-03-28 (×6): 600 mg via ORAL
  Filled 2021-03-25 (×6): qty 1

## 2021-03-25 MED ORDER — INSULIN GLARGINE 100 UNIT/ML ~~LOC~~ SOLN
5.0000 [IU] | Freq: Every day | SUBCUTANEOUS | Status: DC
  Administered 2021-03-26 – 2021-03-28 (×3): 5 [IU] via SUBCUTANEOUS
  Filled 2021-03-25 (×6): qty 0.05

## 2021-03-25 MED ORDER — POTASSIUM CHLORIDE 10 MEQ/100ML IV SOLN
10.0000 meq | INTRAVENOUS | Status: AC
  Administered 2021-03-25 (×3): 10 meq via INTRAVENOUS
  Filled 2021-03-25 (×3): qty 100

## 2021-03-25 NOTE — Progress Notes (Signed)
Cross Cover Chest xray with bilateral infiltrates in setting of new oxygen requirement today per RN report. Added cefepime and vanc (MRSA PCR ordered) D/c cipro Continued diflucan  When discussed with patient he indicated that he didn't feel he was going to survive and had concerns of dying. He did not recall discussing concerns with hospice/palliative care team which will be following up with him.  Currently sats 90% on room air, 2L placed by RN  And improved to 94% He endorses inability to expectorate sputum - guafenesin, saline nasal spray added
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
## 2021-03-25 NOTE — Progress Notes (Signed)
Pharmacy Antibiotic Note  Travis Marshall is a 75 y.o. male admitted on 03/20/2021 with pneumonia.  Pharmacy has been consulted for vancomycin dosing.  Patient is pancytopenic and afebrile.  Plan:  Vancomycin 1500mg  IV x1  Then start vancomycin 1500mg  IV q24 (estAUC 499 using SCr rounded up to 0.8, goal AUC 400-550) Monitor clinical picture, renal function, vanc levels if needed at Css pending LOT F/U C&S, abx de-escalation, LOT   Height: 5\' 7"  (170.2 cm) Weight: 63.5 kg (140 lb) IBW/kg (Calculated) : 66.1  Temp (24hrs), Avg:98.7 F (37.1 C), Min:97.8 F (36.6 C), Max:99.1 F (37.3 C)  Recent Labs  Lab 03/20/21 1710 03/21/21 0530 03/22/21 0508 03/23/21 0515 03/23/21 1323 03/24/21 0855 03/25/21 0548  WBC  --  1.3* 0.6* 0.4* 0.4* 0.4* 0.5*  CREATININE  --  0.40* 0.36* 0.41*  --  0.38* 0.37*  LATICACIDVEN 0.9  --   --   --   --   --   --     Estimated Creatinine Clearance: 73.9 mL/min (A) (by C-G formula based on SCr of 0.37 mg/dL (L)).    Allergies  Allergen Reactions  . Ace Inhibitors Other (See Comments)  . Atorvastatin Other (See Comments)    Antimicrobials this admission: cipro 5/19 >5/21 Fluconazole 5/20> Cefepime 5/21> vanc 5/21  Microbiology results: 5/17 BCx ngtd 5/16 RVP neg   Brendolyn Patty, PharmD Clinical Pharmacist  03/25/2021   8:04 PM

## 2021-03-25 NOTE — Progress Notes (Signed)
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  Patient has not had a bowel movement in 1  Week.  Patient report poor oral intake, 30 pounds weight loss over the last 48-month.  Patient received last chemotherapy on Mar 15, 2021 (carboplatinum, etoposide, Tecentriq) Patient receive Ellen Henri recently.  Patient was found to have severe pancytopenia.  Patient presented also with diffuse weakness, failure to thrive.     Assessment & Plan:   Active Problems:   Neuroendocrine cancer ([place])   Acute hyponatremia   Palliative care encounter   Weakness   Hypomagnesemia   Hypokalemia   Brain metastasis (HCC)   Failure to thrive in adult   Chemotherapy-induced neutropenia (HCC)   Moderate malnutrition (HCC)   Pancytopenia (HCC)   Protein-calorie malnutrition, severe   Adrenal insufficiency (HCC)  1-Diffuse weakness, failure to thrive:  -Likely post chemo -Continue with IV fluids, patient with poor oral intake -Patient will need skilled nursing facility for rehab -Continue with PT.  Component of adrenal insufficiency. Started on IV hydrocortisone, continue   2-Pancytopenia: Secondary to chemotherapy. Neutropenic.  -Patient received 1 unit of packed red blood cells irradiated on 5/18. -Patient also received Udenyca 5/16 (Form of granulocyte colony-stimulating factor). No indication for Granix per Dr Tasia Catchings.  -Patient was started on Ciprofloxacin , empirically prophylax by oncologist.  -stable.   3-Hypomagnesemia; Replete IV times 2.   Hypokalemia; Replete IV.   Oral Candidiasis: Continue with  IV fluconazole and oral  nystatin Improved.   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 -On IV Keppra prophylaxis.  Dexamethasone being tapered by oncologist.  DM, Hyperglycemia: Secondary to steroids. HB-A1c at 9. (02/22/2021) On lantus SSI.   Urinary retention: Foley catheter placed 5/19. He will need voiding trial  Severe Constipation:  He has had several bowel movement after multiple enema. Resolved.   Hyponatremia; Mild. On IV fluids.  Hyperbilirubinemia. Monitor.   Adrenal Insufficiency;  Cortisol low at 2,  He was on dexamethasone.  Now on IV hydrocortisone. Continue, change to Hydrocortisone BID when improved.  No endocrinologist available.    Nutrition Problem: Severe Malnutrition Etiology: chronic illness (cancer)    Signs/Symptoms: percent weight loss,severe fat depletion,severe muscle depletion (7% x 1 month) Percent weight loss: 7 %    Interventions: Glucerna shake,MVI,Liberalize Diet  Estimated body mass index is 21.93 kg/m as calculated from the following:   Height as of this encounter: 5\' 7"  (1.702 m).   Weight as of this encounter: 63.5 kg.   DVT prophylaxis: SCD Code Status: DNR Family Communication: Wife who was at bedside 5/20 Disposition Plan:  Status is: Inpatient  Remains inpatient appropriate because:IV treatments appropriate due to intensity of illness or inability to take PO   Dispo: The patient is from: Home              Anticipated d/c is to: SNF              Patient currently is not medically stable to d/c.   Difficult to place patient No        Consultants:   Oncology  Palliative  Procedures:  None  Antimicrobials:  Cipro 5/19  Subjective: He appears improved today, he is trying to eat breakfast   Objective: Vitals:   03/24/21 2316 03/25/21 0613 03/25/21 0854 03/25/21 1300  BP: 100/64 101/62 98/62 (!) 97/58  Pulse: 76 72 67 66   Resp: 18 20 20 14   Temp: 98.9 F (37.2 C) 99 F (37.2 C) 98.6 F (37 C) 99.1 F (37.3 C)  TempSrc: Oral Oral Oral Oral  SpO2: 93% (!) 89% 92% 91%  Weight:      Height:        Intake/Output Summary (Last 24 hours) at 03/25/2021 1452 Last data filed at 03/25/2021 1424 Gross per 24 hour  Intake 360 ml  Output 1200 ml  Net -840 ml   Filed Weights   03/20/21 1648  Weight: 63.5 kg    Examination:  General exam: NAD Respiratory system: CTA Cardiovascular system: S 1, S 2 RRR Gastrointestinal system: BS present, Soft, NT Central nervous system:,Flact affect Extremities: no edema   Data Reviewed: I have personally reviewed following labs and imaging studies  CBC: Recent Labs  Lab 03/20/21 1514 03/21/21 0530 03/22/21 0508 03/23/21 0515 03/23/21 1323 03/24/21 0855 03/25/21 0548  WBC 2.3*   < > 0.6* 0.4* 0.4* 0.4* 0.5*  NEUTROABS 2.0  --   --   --   --  0.0* 0.2*  HGB 10.4*   < > 7.7* 9.8* 10.0* 9.5* 8.1*  HCT 27.8*   < > 20.9* RESULTS UNAVAILABLE DUE TO INTERFERING SUBSTANCE RESULTS UNAVAILABLE DUE TO INTERFERING SUBSTANCE 25.4* 21.6*  MCV 86.6   < > 87.4 RESULTS UNAVAILABLE DUE TO INTERFERING SUBSTANCE RESULTS UNAVAILABLE DUE TO INTERFERING SUBSTANCE 85.8 87.1  PLT 78*   < > 51* 39* 42* 44* 33*   < > = values in this interval not displayed.   Basic Metabolic Panel: Recent Labs  Lab 03/20/21 1708 03/21/21 0530 03/22/21 0508 03/23/21 0515 03/24/21 0855 03/25/21 0548  NA  --  134* 134* 135 133* 135  K  --  3.2* 3.9 4.0 3.2* 3.2*  CL  --  105 103 103 102 104  CO2  --  24 24 24 23 23   GLUCOSE  --  196* 305* 268* 174* 136*  BUN  --  12 10 18 20 13   CREATININE  --  0.40* 0.36* 0.41* 0.38* 0.37*  CALCIUM  --  7.5* 8.1* 8.3* 7.9* 7.6*  MG 1.7  --  2.0  --   --  1.5*   GFR: Estimated Creatinine Clearance: 73.9 mL/min (A) (by C-G formula based on SCr of 0.37 mg/dL (L)). Liver Function Tests: Recent Labs  Lab 03/20/21 1514 03/24/21 0855 03/25/21 0548  AST 18  13* 16  ALT 25 21 23   ALKPHOS 37* 33* 33*  BILITOT 1.8* 1.6* 1.3*  PROT 5.4* 4.9* 4.8*  ALBUMIN 3.0* 2.5* 2.3*   No results for input(s): LIPASE, AMYLASE in the last 168 hours. No results for input(s): AMMONIA in the last 168 hours. Coagulation Profile: No results for input(s): INR, PROTIME in the last 168 hours. Cardiac Enzymes: Recent Labs  Lab 03/21/21 0530  CKTOTAL 20*   BNP (last 3 results) No results for input(s): PROBNP in the last 8760 hours. HbA1C: No results for input(s): HGBA1C in the last 72 hours. CBG: Recent Labs  Lab 03/24/21 1537 03/24/21 2109 03/25/21 0858 03/25/21 1018 03/25/21 1223  GLUCAP 154* 137* 164* 204* 234*   Lipid Profile: No results for input(s): CHOL, HDL, LDLCALC, TRIG, CHOLHDL, LDLDIRECT  in the last 72 hours. Thyroid Function Tests: No results for input(s): TSH, T4TOTAL, FREET4, T3FREE, THYROIDAB in the last 72 hours. Anemia Panel: No results for input(s): VITAMINB12, FOLATE, FERRITIN, TIBC, IRON, RETICCTPCT in the last 72 hours. Sepsis Labs: Recent Labs  Lab 03/20/21 1710  LATICACIDVEN 0.9    Recent Results (from the past 240 hour(s))  Resp Panel by RT-PCR (Flu A&B, Covid) Nasopharyngeal Swab     Status: None   Collection Time: 03/20/21  5:08 PM   Specimen: Nasopharyngeal Swab; Nasopharyngeal(NP) swabs in vial transport medium  Result Value Ref Range Status   SARS Coronavirus 2 by RT PCR NEGATIVE NEGATIVE Final    Comment: (NOTE) SARS-CoV-2 target nucleic acids are NOT DETECTED.  The SARS-CoV-2 RNA is generally detectable in upper respiratory specimens during the acute phase of infection. The lowest concentration of SARS-CoV-2 viral copies this assay can detect is 138 copies/mL. A negative result does not preclude SARS-Cov-2 infection and should not be used as the sole basis for treatment or other patient management decisions. A negative result may occur with  improper specimen collection/handling, submission of specimen  other than nasopharyngeal swab, presence of viral mutation(s) within the areas targeted by this assay, and inadequate number of viral copies(<138 copies/mL). A negative result must be combined with clinical observations, patient history, and epidemiological information. The expected result is Negative.  Fact Sheet for Patients:  EntrepreneurPulse.com.au  Fact Sheet for Healthcare Providers:  IncredibleEmployment.be  This test is no t yet approved or cleared by the [place] FDA and  has been authorized for detection and/or diagnosis of SARS-CoV-2 by FDA under an Emergency Use Authorization (EUA). This EUA will remain  in effect (meaning this test can be used) for the duration of the COVID-19 declaration under Section 564(b)(1) of the Act, 21 U.S.C.section 360bbb-3(b)(1), unless the authorization is terminated  or revoked sooner.       Influenza A by PCR NEGATIVE NEGATIVE Final   Influenza B by PCR NEGATIVE NEGATIVE Final    Comment: (NOTE) The Xpert Xpress SARS-CoV-2/FLU/RSV plus assay is intended as an aid in the diagnosis of influenza from Nasopharyngeal swab specimens and should not be used as a sole basis for treatment. Nasal washings and aspirates are unacceptable for Xpert Xpress SARS-CoV-2/FLU/RSV testing.  Fact Sheet for Patients: EntrepreneurPulse.com.au  Fact Sheet for Healthcare Providers: IncredibleEmployment.be  This test is not yet approved or cleared by the [place] FDA and has been authorized for detection and/or diagnosis of SARS-CoV-2 by FDA under an Emergency Use Authorization (EUA). This EUA will remain in effect (meaning this test can be used) for the duration of the COVID-19 declaration under Section 564(b)(1) of the Act, 21 U.S.C. section 360bbb-3(b)(1), unless the authorization is terminated or revoked.  Performed at [facility], [address]  [place], [place] [address]   Culture, blood (Routine X 2) w Reflex to ID Panel     Status: None (Preliminary result)   Collection Time: 03/21/21  5:30 AM   Specimen: BLOOD  Result Value Ref Range Status   Specimen Description BLOOD RIGHT HAND  Final   Special Requests   Final    BOTTLES DRAWN AEROBIC AND ANAEROBIC Blood Culture adequate volume   Culture   Final    NO GROWTH 4 DAYS Performed at [facility], [address] [place], [place] [address]    Report Status PENDING  Incomplete  Culture, blood (Routine X 2) w Reflex to ID Panel     Status:  None (Preliminary result)   Collection Time: 03/21/21  5:36 AM   Specimen: BLOOD  Result Value Ref Range Status   Specimen Description BLOOD LEFT HAND  Final   Special Requests   Final    BOTTLES DRAWN AEROBIC ONLY Blood Culture adequate volume   Culture   Final    NO GROWTH 4 DAYS Performed at [facility], [address], [place], [place] [address]    Report Status PENDING  Incomplete         Radiology Studies: No results found.      Scheduled Meds: . bisacodyl  10 mg Rectal Once  . chlorhexidine  15 mL Mouth/Throat BID  . Chlorhexidine Gluconate Cloth  6 each Topical Daily  . feeding supplement (GLUCERNA SHAKE)  237 mL Oral BID BM  . finasteride  5 mg Oral BH-q7a  . hydrocortisone sodium succinate  100 mg Intravenous Q8H  . insulin aspart  0-15 Units Subcutaneous TID WC  . insulin aspart  0-5 Units Subcutaneous QHS  . insulin glargine  5 Units Subcutaneous Daily  . mirtazapine  7.5 mg Oral QHS  . multivitamin with minerals  1 tablet Oral Daily  . nystatin  5 mL Oral QID  . pantoprazole  40 mg Oral QAC breakfast   Continuous Infusions: . sodium chloride 75 mL/hr at 03/24/21 1114  . ciprofloxacin 400 mg (03/25/21 0016)  . fluconazole (DIFLUCAN) IV 100 mg (03/25/21 1023)  . levETIRAcetam 500 mg (03/24/21 2257)     LOS: 3 days    Time spent: 35 minutes.     Elmarie Shiley, MD [facility]   If 7PM-7AM, please contact night-coverage [email or web address]  03/25/2021, 2:52 PM

## 2021-03-26 LAB — CBC WITH DIFFERENTIAL/PLATELET
Abs Immature Granulocytes: 0.14 10*3/uL — ABNORMAL HIGH (ref 0.00–0.07)
Basophils Absolute: 0 10*3/uL (ref 0.0–0.1)
Basophils Relative: 1 %
Eosinophils Absolute: 0 10*3/uL (ref 0.0–0.5)
Eosinophils Relative: 0 %
HCT: 22.6 % — ABNORMAL LOW (ref 39.0–52.0)
Hemoglobin: 8 g/dL — ABNORMAL LOW (ref 13.0–17.0)
Immature Granulocytes: 5 %
Lymphocytes Relative: 17 %
Lymphs Abs: 0.5 10*3/uL — ABNORMAL LOW (ref 0.7–4.0)
MCH: 30.9 pg (ref 26.0–34.0)
MCHC: 35.4 g/dL (ref 30.0–36.0)
MCV: 87.3 fL (ref 80.0–100.0)
Monocytes Absolute: 0.3 10*3/uL (ref 0.1–1.0)
Monocytes Relative: 9 %
Neutro Abs: 1.9 10*3/uL (ref 1.7–7.7)
Neutrophils Relative %: 68 %
Platelets: 42 10*3/uL — ABNORMAL LOW (ref 150–400)
RBC: 2.59 MIL/uL — ABNORMAL LOW (ref 4.22–5.81)
RDW: 14.6 % (ref 11.5–15.5)
Smear Review: NORMAL
WBC: 2.8 10*3/uL — ABNORMAL LOW (ref 4.0–10.5)
nRBC: 4.3 % — ABNORMAL HIGH (ref 0.0–0.2)

## 2021-03-26 LAB — COMPREHENSIVE METABOLIC PANEL
ALT: 60 U/L — ABNORMAL HIGH (ref 0–44)
AST: 39 U/L (ref 15–41)
Albumin: 2.1 g/dL — ABNORMAL LOW (ref 3.5–5.0)
Alkaline Phosphatase: 31 U/L — ABNORMAL LOW (ref 38–126)
Anion gap: 7 (ref 5–15)
BUN: 13 mg/dL (ref 8–23)
CO2: 22 mmol/L (ref 22–32)
Calcium: 7.5 mg/dL — ABNORMAL LOW (ref 8.9–10.3)
Chloride: 108 mmol/L (ref 98–111)
Creatinine, Ser: 0.3 mg/dL — ABNORMAL LOW (ref 0.61–1.24)
GFR, Estimated: >60 mL/min (ref >60)
Glucose, Bld: 191 mg/dL — ABNORMAL HIGH (ref 70–99)
Potassium: 3.2 mmol/L — ABNORMAL LOW (ref 3.5–5.1)
Sodium: 137 mmol/L (ref 135–145)
Total Bilirubin: 1.1 mg/dL (ref 0.3–1.2)
Total Protein: 4.5 g/dL — ABNORMAL LOW (ref 6.5–8.1)

## 2021-03-26 LAB — GLUCOSE, CAPILLARY
Glucose-Capillary: 202 mg/dL — ABNORMAL HIGH (ref 70–99)
Glucose-Capillary: 243 mg/dL — ABNORMAL HIGH (ref 70–99)
Glucose-Capillary: 268 mg/dL — ABNORMAL HIGH (ref 70–99)
Glucose-Capillary: 172 mg/dL — ABNORMAL HIGH (ref 70–99)

## 2021-03-26 LAB — CBC
HCT: 19.3 % — ABNORMAL LOW (ref 39.0–52.0)
Hemoglobin: 7.1 g/dL — ABNORMAL LOW (ref 13.0–17.0)
MCH: 32.1 pg (ref 26.0–34.0)
MCHC: 36.8 g/dL — ABNORMAL HIGH (ref 30.0–36.0)
MCV: 87.3 fL (ref 80.0–100.0)
Platelets: 35 10*3/uL — ABNORMAL LOW (ref 150–400)
RBC: 2.21 MIL/uL — ABNORMAL LOW (ref 4.22–5.81)
RDW: 13.7 % (ref 11.5–15.5)
WBC: 0.9 10*3/uL — CL (ref 4.0–10.5)
nRBC: 5.3 % — ABNORMAL HIGH (ref 0.0–0.2)

## 2021-03-26 LAB — CULTURE, BLOOD (ROUTINE X 2)
Culture: NO GROWTH
Culture: NO GROWTH
Special Requests: ADEQUATE
Special Requests: ADEQUATE

## 2021-03-26 LAB — TSH: TSH: 0.109 u[IU]/mL — ABNORMAL LOW (ref 0.350–4.500)

## 2021-03-26 LAB — PREPARE RBC (CROSSMATCH)

## 2021-03-26 LAB — MAGNESIUM: Magnesium: 2.1 mg/dL (ref 1.7–2.4)

## 2021-03-26 LAB — T4, FREE: Free T4: 0.97 ng/dL (ref 0.61–1.12)

## 2021-03-26 MED ORDER — SODIUM CHLORIDE 0.9% IV SOLUTION: Freq: Once | INTRAVENOUS | Status: AC

## 2021-03-26 MED ORDER — POTASSIUM CHLORIDE CRYS ER 20 MEQ PO TBCR
40.0000 meq | EXTENDED_RELEASE_TABLET | ORAL | Status: AC
  Administered 2021-03-26 (×2): 40 meq via ORAL
  Filled 2021-03-26 (×2): qty 2

## 2021-03-26 NOTE — Progress Notes (Signed)
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  Patient has not had a bowel movement in 1  Week.  Patient report poor oral intake, 30 pounds weight loss over the last 30-month.  Patient received last chemotherapy on Mar 15, 2021 (carboplatinum, etoposide, Tecentriq) Patient receive Ellen Henri recently.  Patient was found to have severe pancytopenia.  Patient presented also with diffuse weakness, failure to thrive.     Assessment & Plan:   Active Problems:   Neuroendocrine cancer ([place])   Acute hyponatremia   Palliative care encounter   Weakness   Hypomagnesemia   Hypokalemia   Brain metastasis (HCC)   Failure to thrive in adult   Chemotherapy-induced neutropenia (HCC)   Moderate malnutrition (HCC)   Pancytopenia (HCC)   Protein-calorie malnutrition, severe   Adrenal insufficiency (HCC)  1-Diffuse weakness, failure to thrive:  -Likely post chemo -Patient will need skilled nursing facility for rehab -Continue with PT.  -Component of adrenal insufficiency. Started on IV hydrocortisone, continue  -TSH low, awaiting Free T 3 and T 4.   2-Pancytopenia: Secondary to chemotherapy. Neutropenic.  -Patient received 1 unit of packed red blood cells irradiated on 5/18. -Patient also received Udenyca 5/16 (Form of granulocyte colony-stimulating factor). No indication for Granix per Dr Tasia Catchings.  -Patient was started on Ciprofloxacin , empirically prophylax by oncologist.  -WBC 0.9 -Hb down to 7, will proceed with one unit PRBC irradiated.   3-Hypomagnesemia; Replaced.   PNA; Oxygen decrease to 90. Place on 2 L oxygen.   Antibiotics change to cefepime.   Hypokalemia; Replete IV.   Oral Candidiasis: Continue with  IV fluconazole and oral nystatin Improving.   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 -On IV Keppra prophylaxis.   DM, Hyperglycemia: Secondary to steroids. HB-A1c at 9. (02/22/2021) On Lantus, SSI.   Urinary retention: Foley catheter placed 5/19. He will need voiding trial  Severe Constipation:  He has had several bowel movement after multiple enema. Resolved.   Hyponatremia; Mild. On IV fluids.  Hyperbilirubinemia. Monitor.   Adrenal Insufficiency;  Cortisol low at 2,  He was on dexamethasone.  Now on IV hydrocortisone. Continue, change to Hydrocortisone BID when improved.     Nutrition Problem: Severe Malnutrition Etiology: chronic illness (cancer)    Signs/Symptoms: percent weight loss,severe fat depletion,severe muscle depletion (7% x 1 month) Percent weight loss: 7 %    Interventions: Glucerna shake,MVI,Liberalize Diet  Estimated body mass index is 21.93 kg/m as calculated from the following:   Height as of this encounter: 5\' 7"  (1.702 m).   Weight as of this encounter: 63.5 kg.   DVT prophylaxis: SCD Code Status: DNR Family Communication: Wife who was at bedside 5/22 Disposition Plan:  Status is: Inpatient  Remains inpatient appropriate because:IV treatments appropriate due to intensity of illness or inability to take PO   Dispo: The patient is from: Home              Anticipated d/c is to: SNF              Patient currently is not medically stable to d/c.   Difficult  to place patient No        Consultants:   Oncology  Palliative  Procedures:   None  Antimicrobials:  Cipro 5/19  Subjective: He appears in very spirit.  He feels a little better.  He drank ensure. He does not want to eat breakfast.   Objective: Vitals:   03/25/21  1959 03/25/21 2334 03/26/21 0635 03/26/21 0740  BP: (!) 95/54 100/62 112/64 99/64  Pulse: 65 61 (!) 55 62  Resp: 16 16 20 17   Temp: 98.7 F (37.1 C) 98.6 F (37 C) 98.3 F (36.8 C) 98.1 F (36.7 C)  TempSrc: Oral Oral Oral   SpO2: 95% 100% 97% 97%  Weight:      Height:        Intake/Output Summary (Last 24 hours) at 03/26/2021 1216 Last data filed at 03/26/2021 1009 Gross per 24 hour  Intake 4635.07 ml  Output 1925 ml  Net 2710.07 ml   Filed Weights   03/20/21 1648  Weight: 63.5 kg    Examination:  General exam: NAD Respiratory system: BL crackles.  Cardiovascular system: S 1, S 2 RRR Gastrointestinal system: BS present, soft, nt Central nervous system:,alert, follows command Extremities: No edema   Data Reviewed: I have personally reviewed following labs and imaging studies  CBC: Recent Labs  Lab 03/20/21 1514 03/21/21 0530 03/23/21 0515 03/23/21 1323 03/24/21 0855 03/25/21 0548 03/26/21 0530  WBC 2.3*   < > 0.4* 0.4* 0.4* 0.5* 0.9*  NEUTROABS 2.0  --   --   --  0.0* 0.2*  --   HGB 10.4*   < > 9.8* 10.0* 9.5* 8.1* 7.1*  HCT 27.8*   < > RESULTS UNAVAILABLE DUE TO INTERFERING SUBSTANCE RESULTS UNAVAILABLE DUE TO INTERFERING SUBSTANCE 25.4* 21.6* 19.3*  MCV 86.6   < > RESULTS UNAVAILABLE DUE TO INTERFERING SUBSTANCE RESULTS UNAVAILABLE DUE TO INTERFERING SUBSTANCE 85.8 87.1 87.3  PLT 78*   < > 39* 42* 44* 33* 35*   < > = values in this interval not displayed.   Basic Metabolic Panel: Recent Labs  Lab 03/20/21 1708 03/21/21 0530 03/22/21 0508 03/23/21 0515 03/24/21 0855 03/25/21 0548 03/26/21 0530 03/26/21 0817  NA  --    < > 134* 135 133* 135 137  --   K  --    < > 3.9 4.0 3.2* 3.2* 3.2*  --   CL  --    < > 103 103 102 104 108  --   CO2  --    < > 24 24 23 23 22   --   GLUCOSE  --    < > 305* 268* 174* 136* 191*  --   BUN  --    < > 10 18 20 13 13   --   CREATININE  --    < > 0.36* 0.41* 0.38* 0.37* 0.30*  --   CALCIUM  --    < > 8.1* 8.3* 7.9*  7.6* 7.5*  --   MG 1.7  --  2.0  --   --  1.5*  --  2.1   < > = values in this interval not displayed.   GFR: Estimated Creatinine Clearance: 73.9 mL/min (A) (by C-G formula based on SCr of 0.3 mg/dL (L)). Liver Function Tests: Recent Labs  Lab 03/20/21 1514 03/24/21 0855 03/25/21 0548 03/26/21 0530  AST 18 13* 16 39  ALT 25 21 23  60*  ALKPHOS 37* 33* 33* 31*  BILITOT 1.8* 1.6* 1.3* 1.1  PROT 5.4*  4.9* 4.8* 4.5*  ALBUMIN 3.0* 2.5* 2.3* 2.1*   No results for input(s): LIPASE, AMYLASE in the last 168 hours. No results for input(s): AMMONIA in the last 168 hours. Coagulation Profile: No results for input(s): INR, PROTIME in the last 168 hours. Cardiac Enzymes: Recent Labs  Lab 03/21/21 0530  CKTOTAL 20*   BNP (last 3 results) No results for input(s): PROBNP in the last 8760 hours. HbA1C: No results for input(s): HGBA1C in the last 72 hours. CBG: Recent Labs  Lab 03/25/21 1223 03/25/21 1641 03/25/21 1935 03/25/21 2054 03/26/21 0738  GLUCAP 234* 166* 97 218* 202*   Lipid Profile: No results for input(s): CHOL, HDL, LDLCALC, TRIG, CHOLHDL, LDLDIRECT in the last 72 hours. Thyroid Function Tests: Recent Labs    03/26/21 0530  TSH 0.109*  FREET4 0.97   Anemia Panel: No results for input(s): VITAMINB12, FOLATE, FERRITIN, TIBC, IRON, RETICCTPCT in the last 72 hours. Sepsis Labs: Recent Labs  Lab 03/20/21 1710  LATICACIDVEN 0.9    Recent Results (from the past 240 hour(s))  Resp Panel by RT-PCR (Flu A&B, Covid) Nasopharyngeal Swab     Status: None   Collection Time: 03/20/21  5:08 PM   Specimen: Nasopharyngeal Swab; Nasopharyngeal(NP) swabs in vial transport medium  Result Value Ref Range Status   SARS Coronavirus 2 by RT PCR NEGATIVE NEGATIVE Final    Comment: (NOTE) SARS-CoV-2 target nucleic acids are NOT DETECTED.  The SARS-CoV-2 RNA is generally detectable in upper respiratory specimens during the acute phase of infection. The lowest concentration of  SARS-CoV-2 viral copies this assay can detect is 138 copies/mL. A negative result does not preclude SARS-Cov-2 infection and should not be used as the sole basis for treatment or other patient management decisions. A negative result may occur with  improper specimen collection/handling, submission of specimen other than nasopharyngeal swab, presence of viral mutation(s) within the areas targeted by this assay, and inadequate number of viral copies(<138 copies/mL). A negative result must be combined with clinical observations, patient history, and epidemiological information. The expected result is Negative.  Fact Sheet for Patients:  EntrepreneurPulse.com.au  Fact Sheet for Healthcare Providers:  IncredibleEmployment.be  This test is no t yet approved or cleared by the [place] FDA and  has been authorized for detection and/or diagnosis of SARS-CoV-2 by FDA under an Emergency Use Authorization (EUA). This EUA will remain  in effect (meaning this test can be used) for the duration of the COVID-19 declaration under Section 564(b)(1) of the Act, 21 U.S.C.section 360bbb-3(b)(1), unless the authorization is terminated  or revoked sooner.       Influenza A by PCR NEGATIVE NEGATIVE Final   Influenza B by PCR NEGATIVE NEGATIVE Final    Comment: (NOTE) The Xpert Xpress SARS-CoV-2/FLU/RSV plus assay is intended as an aid in the diagnosis of influenza from Nasopharyngeal swab specimens and should not be used as a sole basis for treatment. Nasal washings and aspirates are unacceptable for Xpert Xpress SARS-CoV-2/FLU/RSV testing.  Fact Sheet for Patients: EntrepreneurPulse.com.au  Fact Sheet for Healthcare Providers: IncredibleEmployment.be  This test is not yet approved or cleared by the [place] FDA and has been authorized for detection and/or diagnosis of SARS-CoV-2 by FDA under an Emergency Use  Authorization (EUA). This EUA will remain in effect (meaning this test can be used) for the duration of the COVID-19 declaration under Section 564(b)(1) of the Act, 21 U.S.C. section 360bbb-3(b)(1), unless the authorization is terminated or revoked.  Performed at [facility], [address] [place], [place] [address]   Culture, blood (Routine X 2) w Reflex to ID Panel     Status: None   Collection Time: 03/21/21  5:30 AM   Specimen: BLOOD  Result Value Ref Range Status   Specimen Description BLOOD RIGHT HAND  Final   Special Requests   Final    BOTTLES DRAWN AEROBIC AND ANAEROBIC Blood Culture adequate volume   Culture   Final    NO GROWTH 5 DAYS Performed at [facility], [address] [place], [place] [address]    Report Status 03/26/2021 FINAL  Final  Culture, blood (Routine X 2) w Reflex to ID Panel     Status: None   Collection Time: 03/21/21  5:36 AM   Specimen: BLOOD  Result Value Ref Range Status   Specimen Description BLOOD LEFT HAND  Final   Special Requests   Final    BOTTLES DRAWN AEROBIC ONLY Blood Culture adequate volume   Culture   Final    NO GROWTH 5 DAYS Performed at [facility], [address] [place], [place] [address]    Report Status 03/26/2021 FINAL  Final  MRSA PCR Screening     Status: None   Collection Time: 03/25/21  7:50 PM   Specimen: Nasopharyngeal  Result Value Ref Range Status   MRSA by PCR NEGATIVE NEGATIVE Final    Comment:        The GeneXpert MRSA Assay (FDA approved for NASAL specimens only), is one component of a comprehensive MRSA colonization surveillance program. It is not intended to diagnose MRSA infection nor to guide or monitor treatment for MRSA infections. Performed at [facility], [address], [place], [place] [address]          Radiology Studies: DG Chest 1 View  Result Date: 03/25/2021 CLINICAL DATA:  Hypoxia. EXAM: CHEST  1 VIEW COMPARISON:  Mar 20, 2021.  FINDINGS: The heart size and mediastinal contours are within normal limits. No pneumothorax or pleural effusion is noted. Mild patchy airspace opacities are noted bilaterally concerning for possible pneumonia. The visualized skeletal structures are unremarkable. IMPRESSION: Possible bilateral pneumonia. Electronically Signed   By: Marijo Conception M.D.   On: 03/25/2021 19:14        Scheduled Meds: . sodium chloride   Intravenous Once  . bisacodyl  10 mg Rectal Once  . chlorhexidine  15 mL Mouth/Throat BID  . Chlorhexidine Gluconate Cloth  6 each Topical Daily  . feeding supplement (GLUCERNA SHAKE)  237 mL Oral BID BM  . finasteride  5 mg Oral BH-q7a  . guaiFENesin  600 mg Oral BID  . hydrocortisone sodium succinate  100 mg Intravenous Q8H  . insulin aspart  0-15 Units Subcutaneous TID WC  . insulin aspart  0-5 Units Subcutaneous QHS  . insulin glargine  5 Units Subcutaneous Daily  . mirtazapine  7.5 mg Oral QHS  . multivitamin with minerals  1 tablet Oral Daily  . nystatin  5 mL Oral QID  . pantoprazole  40 mg Oral QAC breakfast  . potassium chloride  40 mEq Oral Q4H   Continuous Infusions: . ceFEPime (MAXIPIME) IV 2 g (03/26/21 3500)  . fluconazole (DIFLUCAN) IV Stopped (03/25/21 1123)  . levETIRAcetam 500 mg (03/26/21 0446)  . vancomycin 1,500 mg (03/25/21 2017)     LOS: 4 days    Time spent: 35 minutes.     Elmarie Shiley, MD [facility]   If 7PM-7AM, please contact night-coverage  [email or web address]  03/26/2021, 12:16 PM

## 2021-03-27 ENCOUNTER — Other Ambulatory Visit: Payer: Self-pay | Admitting: Adult Health Nurse Practitioner

## 2021-03-27 DIAGNOSIS — C7A8 Other malignant neuroendocrine tumors: Secondary | ICD-10-CM | POA: Diagnosis not present

## 2021-03-27 DIAGNOSIS — C7931 Secondary malignant neoplasm of brain: Secondary | ICD-10-CM | POA: Diagnosis not present

## 2021-03-27 DIAGNOSIS — E876 Hypokalemia: Secondary | ICD-10-CM | POA: Diagnosis not present

## 2021-03-27 DIAGNOSIS — D701 Agranulocytosis secondary to cancer chemotherapy: Secondary | ICD-10-CM | POA: Diagnosis not present

## 2021-03-27 LAB — TYPE AND SCREEN
ABO/RH(D): A POS
Antibody Screen: NEGATIVE
Unit division: 0

## 2021-03-27 LAB — CBC WITH DIFFERENTIAL/PLATELET
Abs Immature Granulocytes: 1.06 10*3/uL — ABNORMAL HIGH (ref 0.00–0.07)
Basophils Absolute: 0 10*3/uL (ref 0.0–0.1)
Basophils Relative: 1 %
Eosinophils Absolute: 0 10*3/uL (ref 0.0–0.5)
Eosinophils Relative: 0 %
HCT: 23.4 % — ABNORMAL LOW (ref 39.0–52.0)
Hemoglobin: 8.5 g/dL — ABNORMAL LOW (ref 13.0–17.0)
Immature Granulocytes: 28 %
Lymphocytes Relative: 14 %
Lymphs Abs: 0.5 10*3/uL — ABNORMAL LOW (ref 0.7–4.0)
MCH: 32 pg (ref 26.0–34.0)
MCHC: 36.3 g/dL — ABNORMAL HIGH (ref 30.0–36.0)
MCV: 88 fL (ref 80.0–100.0)
Monocytes Absolute: 0.4 10*3/uL (ref 0.1–1.0)
Monocytes Relative: 10 %
Neutro Abs: 1.8 10*3/uL (ref 1.7–7.7)
Neutrophils Relative %: 47 %
Platelets: 47 10*3/uL — ABNORMAL LOW (ref 150–400)
RBC: 2.66 MIL/uL — ABNORMAL LOW (ref 4.22–5.81)
RDW: 16 % — ABNORMAL HIGH (ref 11.5–15.5)
Smear Review: NORMAL
WBC: 3.9 10*3/uL — ABNORMAL LOW (ref 4.0–10.5)
nRBC: 2.8 % — ABNORMAL HIGH (ref 0.0–0.2)

## 2021-03-27 LAB — COMPREHENSIVE METABOLIC PANEL
ALT: 86 U/L — ABNORMAL HIGH (ref 0–44)
AST: 36 U/L (ref 15–41)
Albumin: 2.2 g/dL — ABNORMAL LOW (ref 3.5–5.0)
Alkaline Phosphatase: 37 U/L — ABNORMAL LOW (ref 38–126)
Anion gap: 7 (ref 5–15)
BUN: 16 mg/dL (ref 8–23)
CO2: 21 mmol/L — ABNORMAL LOW (ref 22–32)
Calcium: 7.7 mg/dL — ABNORMAL LOW (ref 8.9–10.3)
Chloride: 109 mmol/L (ref 98–111)
Creatinine, Ser: 0.35 mg/dL — ABNORMAL LOW (ref 0.61–1.24)
GFR, Estimated: >60 mL/min (ref >60)
Glucose, Bld: 259 mg/dL — ABNORMAL HIGH (ref 70–99)
Potassium: 3.4 mmol/L — ABNORMAL LOW (ref 3.5–5.1)
Sodium: 137 mmol/L (ref 135–145)
Total Bilirubin: 0.8 mg/dL (ref 0.3–1.2)
Total Protein: 4.5 g/dL — ABNORMAL LOW (ref 6.5–8.1)

## 2021-03-27 LAB — CBC
HCT: 22.8 % — ABNORMAL LOW (ref 39.0–52.0)
Hemoglobin: 8.3 g/dL — ABNORMAL LOW (ref 13.0–17.0)
MCH: 31.7 pg (ref 26.0–34.0)
MCHC: 36.4 g/dL — ABNORMAL HIGH (ref 30.0–36.0)
MCV: 87 fL (ref 80.0–100.0)
Platelets: 47 10*3/uL — ABNORMAL LOW (ref 150–400)
RBC: 2.62 MIL/uL — ABNORMAL LOW (ref 4.22–5.81)
RDW: 15.3 % (ref 11.5–15.5)
WBC: 3.8 10*3/uL — ABNORMAL LOW (ref 4.0–10.5)
nRBC: 3.1 % — ABNORMAL HIGH (ref 0.0–0.2)

## 2021-03-27 LAB — GLUCOSE, CAPILLARY
Glucose-Capillary: 235 mg/dL — ABNORMAL HIGH (ref 70–99)
Glucose-Capillary: 273 mg/dL — ABNORMAL HIGH (ref 70–99)
Glucose-Capillary: 204 mg/dL — ABNORMAL HIGH (ref 70–99)
Glucose-Capillary: 175 mg/dL — ABNORMAL HIGH (ref 70–99)

## 2021-03-27 LAB — BPAM RBC
Blood Product Expiration Date: 202205282359
ISSUE DATE / TIME: 202205221440
Unit Type and Rh: 5100

## 2021-03-27 LAB — T3, FREE: T3, Free: 0.7 pg/mL — ABNORMAL LOW (ref 2.0–4.4)

## 2021-03-27 LAB — ACTH: C206 ACTH: <1.5 pg/mL — ABNORMAL LOW (ref 7.2–63.3)

## 2021-03-27 MED ORDER — HYDROCORTISONE 5 MG PO TABS
15.0000 mg | ORAL_TABLET | Freq: Every day | ORAL | Status: DC
  Administered 2021-03-27: 15 mg via ORAL
  Filled 2021-03-27 (×2): qty 1

## 2021-03-27 MED ORDER — HYDROCORTISONE 10 MG PO TABS
20.0000 mg | ORAL_TABLET | Freq: Every day | ORAL | Status: DC
  Administered 2021-03-28: 13:00:00 20 mg via ORAL
  Filled 2021-03-27 (×4): qty 2

## 2021-03-27 MED ORDER — LEVETIRACETAM 500 MG PO TABS
500.0000 mg | ORAL_TABLET | Freq: Two times a day (BID) | ORAL | Status: DC
  Administered 2021-03-27 – 2021-03-28 (×2): 500 mg via ORAL
  Filled 2021-03-27 (×5): qty 1

## 2021-03-27 MED ORDER — POTASSIUM CHLORIDE CRYS ER 20 MEQ PO TBCR
40.0000 meq | EXTENDED_RELEASE_TABLET | Freq: Once | ORAL | Status: AC
  Administered 2021-03-27: 40 meq via ORAL
  Filled 2021-03-27: qty 2

## 2021-03-27 NOTE — Care Management Important Message (Signed)
Important Message  Patient Details  Name: Travis Marshall MRN: [id number] Date of Birth: May 18, 1946   Medicare Important Message Given:  Yes     Dannette Barbara 03/27/2021, 11:14 AM
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
## 2021-03-27 NOTE — TOC Progression Note (Signed)
Transition of Care [facility]) - Progression Note    Patient Details  Name: Travis Marshall MRN: [id number] Date of Birth: [birth year]  Transition of Care [facility]) CM/SW [place], RN Phone Number: 03/27/2021, 1:00 PM  Clinical Narrative: Received a call from [facility], patient insurance authorization done can admit to facility when medically stable.      Expected Discharge Plan: [place] Barriers to Discharge: Continued Medical Work up  Expected Discharge Plan and Services Expected Discharge Plan: [place]   Discharge Planning Services: CM Consult Post Acute Care Choice: [place] Living arrangements for the past 2 months: Single Family Home                 DME Arranged: N/A DME Agency: NA       HH Arranged: NA           Social Determinants of Health (SDOH) Interventions    Readmission Risk Interventions No flowsheet data found.

## 2021-03-27 NOTE — Progress Notes (Signed)
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  Patient has not had a bowel movement in 1  Week.  Patient report poor oral intake, 30 pounds weight loss over the last 34-month.  Patient received last chemotherapy on Mar 15, 2021 (carboplatinum, etoposide, Tecentriq) Patient receive Ellen Henri recently.  Patient was found to have severe pancytopenia.  Patient presented also with diffuse weakness, failure to thrive.     Assessment & Plan:   Active Problems:   Neuroendocrine cancer ([place])   Acute hyponatremia   Palliative care encounter   Weakness   Hypomagnesemia   Hypokalemia   Brain metastasis (HCC)   Failure to thrive in adult   Chemotherapy-induced neutropenia (HCC)   Moderate malnutrition (HCC)   Pancytopenia (HCC)   Protein-calorie malnutrition, severe   Adrenal insufficiency (HCC)  1-Diffuse weakness, failure to thrive:  -Likely post chemo -Patient will need skilled nursing facility for rehab -Continue with PT.  -Component of adrenal insufficiency. Cortisol: 2.7 , ACTH less 1.5 Started on IV hydrocortisone, continue  -TSH: 0.109, Free T 3: 0.7 and T 4: 0.97. -Discussed with endocrinologist plan to repeat as an out patient.   2-Pancytopenia: Secondary to chemotherapy. Neutropenic.  -Patient received 1 unit of packed red blood cells irradiated on 5/18. -Patient also received Udenyca 5/16 (Form of granulocyte colony-stimulating factor). No indication for Granix per Dr Tasia Catchings.  -WBC 3.8 -Received one unit PRBC 5/22.  3-Hypomagnesemia; Replaced.   PNA; Oxygen decrease to 90. Place on 2 L oxygen.   Antibiotics change to cefepime. Continue with Cefepime day 3.   Hypokalemia; Replete orally.  Oral Candidiasis: Continue with  IV fluconazole and oral nystatin Improving.    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 -On IV Keppra prophylaxis.   DM, Hyperglycemia: Secondary to steroids. HB-A1c at 9. (02/22/2021) On Lantus, SSI.   Urinary retention: Foley catheter placed 5/19. Voiding trial today.   Severe Constipation:  He has had several bowel movement after multiple enema. Resolved.   Hyponatremia; Mild. Resolved,..  Hyperbilirubinemia. Monitor.   Adrenal Insufficiency;  Cortisol low at 2,  He was on dexamethasone.  Now on IV hydrocortisone. Continue, change to Hydrocortisone BID today.     Nutrition Problem: Severe Malnutrition Etiology: chronic illness (cancer)    Signs/Symptoms: percent weight loss,severe fat depletion,severe muscle depletion (7% x 1 month) Percent weight loss: 7 %    Interventions: Glucerna shake,MVI,Liberalize Diet  Estimated body mass index is 21.93 kg/m as calculated from the following:   Height as of this encounter: 5\' 7"  (1.702 m).   Weight as of this encounter: 63.5 kg.   DVT prophylaxis: SCD Code Status: DNR Family Communication: Wife who was at bedside 5/22 Disposition Plan:  Status is: Inpatient  Remains inpatient appropriate because:IV treatments appropriate due to intensity of illness or inability to take PO   Dispo: The patient is from: Home              Anticipated d/c is to: SNF              Patient currently is not medically stable to d/c.   Difficult  to place patient No        Consultants:   Oncology  Palliative  Procedures:   None  Antimicrobials:  Cipro 5/19  Subjective: He report feeling less weak.  Eating a little be more.   Objective: Vitals:   03/27/21 0019 03/27/21 0430 03/27/21 0742  03/27/21 1134  BP: 116/64 108/60 (!) 114/53 (!) 111/55  Pulse: (!) 54 (!) 56 (!) 54 (!) 59  Resp: 20 18 20 20   Temp: 98 F (36.7 C) 98.1 F (36.7 C) (!) 97.5 F (36.4 C) (!) 97 F (36.1 C)  TempSrc: Oral   Axillary  SpO2: 97% 91% 96% 93%  Weight:      Height:        Intake/Output Summary (Last 24 hours) at 03/27/2021 1339 Last data filed at 03/27/2021 1031 Gross per 24 hour  Intake 1205.88 ml  Output 975 ml  Net 230.88 ml   Filed Weights   03/20/21 1648  Weight: 63.5 kg    Examination:  General exam: NAD Respiratory system: CTA Cardiovascular system: S 1, S 2 RRR Gastrointestinal system: BS present, soft, nt Central nervous system:,Alert Extremities: No edema   Data Reviewed: I have personally reviewed following labs and imaging studies  CBC: Recent Labs  Lab 03/20/21 1514 03/21/21 0530 03/24/21 0855 03/25/21 0548 03/26/21 0530 03/26/21 1910 03/27/21 0441  WBC 2.3*   < > 0.4* 0.5* 0.9* 2.8* 3.8*  NEUTROABS 2.0  --  0.0* 0.2*  --  1.9  --   HGB 10.4*   < > 9.5* 8.1* 7.1* 8.0* 8.3*  HCT 27.8*   < > 25.4* 21.6* 19.3* 22.6* 22.8*  MCV 86.6   < > 85.8 87.1 87.3 87.3 87.0  PLT 78*   < > 44* 33* 35* 42* 47*   < > = values in this interval not displayed.   Basic Metabolic Panel: Recent Labs  Lab 03/20/21 1708 03/21/21 0530 03/22/21 0508 03/23/21 0515 03/24/21 0855 03/25/21 0548 03/26/21 0530 03/26/21 0817 03/27/21 0441  NA  --    < > 134* 135 133* 135 137  --  137  K  --    < > 3.9 4.0 3.2* 3.2* 3.2*  --  3.4*  CL  --    < > 103 103 102 104 108  --  109  CO2  --    < > 24 24 23 23 22   --  21*  GLUCOSE  --    < > 305* 268* 174* 136* 191*  --  259*  BUN  --    < > 10 18 20 13 13   --  16  CREATININE  --    < > 0.36* 0.41* 0.38* 0.37* 0.30*  --  0.35*  CALCIUM  --    < > 8.1* 8.3* 7.9* 7.6* 7.5*  --  7.7*  MG 1.7  --  2.0  --   --  1.5*  --  2.1  --    < > = values in this interval not displayed.   GFR: Estimated Creatinine Clearance: 73.9 mL/min (A)  (by C-G formula based on SCr of 0.35 mg/dL (L)). Liver Function Tests: Recent Labs  Lab 03/20/21 1514 03/24/21 0855 03/25/21 0548 03/26/21 0530 03/27/21 0441  AST 18 13* 16 39 36  ALT 25 21 23  60* 86*  ALKPHOS 37* 33* 33* 31* 37*  BILITOT 1.8* 1.6* 1.3* 1.1 0.8  PROT 5.4* 4.9* 4.8* 4.5* 4.5*  ALBUMIN 3.0* 2.5* 2.3* 2.1* 2.2*  No results for input(s): LIPASE, AMYLASE in the last 168 hours. No results for input(s): AMMONIA in the last 168 hours. Coagulation Profile: No results for input(s): INR, PROTIME in the last 168 hours. Cardiac Enzymes: Recent Labs  Lab 03/21/21 0530  CKTOTAL 20*   BNP (last 3 results) No results for input(s): PROBNP in the last 8760 hours. HbA1C: No results for input(s): HGBA1C in the last 72 hours. CBG: Recent Labs  Lab 03/26/21 1242 03/26/21 1555 03/26/21 2050 03/27/21 0744 03/27/21 1136  GLUCAP 243* 268* 172* 235* 273*   Lipid Profile: No results for input(s): CHOL, HDL, LDLCALC, TRIG, CHOLHDL, LDLDIRECT in the last 72 hours. Thyroid Function Tests: Recent Labs    03/26/21 0530 03/26/21 0817  TSH 0.109*  --   FREET4 0.97  --   T3FREE  --  0.7*   Anemia Panel: No results for input(s): VITAMINB12, FOLATE, FERRITIN, TIBC, IRON, RETICCTPCT in the last 72 hours. Sepsis Labs: Recent Labs  Lab 03/20/21 1710  LATICACIDVEN 0.9    Recent Results (from the past 240 hour(s))  Resp Panel by RT-PCR (Flu A&B, Covid) Nasopharyngeal Swab     Status: None   Collection Time: 03/20/21  5:08 PM   Specimen: Nasopharyngeal Swab; Nasopharyngeal(NP) swabs in vial transport medium  Result Value Ref Range Status   SARS Coronavirus 2 by RT PCR NEGATIVE NEGATIVE Final    Comment: (NOTE) SARS-CoV-2 target nucleic acids are NOT DETECTED.  The SARS-CoV-2 RNA is generally detectable in upper respiratory specimens during the acute phase of infection. The lowest concentration of SARS-CoV-2 viral copies this assay can detect is 138 copies/mL. A negative  result does not preclude SARS-Cov-2 infection and should not be used as the sole basis for treatment or other patient management decisions. A negative result may occur with  improper specimen collection/handling, submission of specimen other than nasopharyngeal swab, presence of viral mutation(s) within the areas targeted by this assay, and inadequate number of viral copies(<138 copies/mL). A negative result must be combined with clinical observations, patient history, and epidemiological information. The expected result is Negative.  Fact Sheet for Patients:  EntrepreneurPulse.com.au  Fact Sheet for Healthcare Providers:  IncredibleEmployment.be  This test is no t yet approved or cleared by the [place] FDA and  has been authorized for detection and/or diagnosis of SARS-CoV-2 by FDA under an Emergency Use Authorization (EUA). This EUA will remain  in effect (meaning this test can be used) for the duration of the COVID-19 declaration under Section 564(b)(1) of the Act, 21 U.S.C.section 360bbb-3(b)(1), unless the authorization is terminated  or revoked sooner.       Influenza A by PCR NEGATIVE NEGATIVE Final   Influenza B by PCR NEGATIVE NEGATIVE Final    Comment: (NOTE) The Xpert Xpress SARS-CoV-2/FLU/RSV plus assay is intended as an aid in the diagnosis of influenza from Nasopharyngeal swab specimens and should not be used as a sole basis for treatment. Nasal washings and aspirates are unacceptable for Xpert Xpress SARS-CoV-2/FLU/RSV testing.  Fact Sheet for Patients: EntrepreneurPulse.com.au  Fact Sheet for Healthcare Providers: IncredibleEmployment.be  This test is not yet approved or cleared by the [place] FDA and has been authorized for detection and/or diagnosis of SARS-CoV-2 by FDA under an Emergency Use Authorization (EUA). This EUA will remain in effect (meaning this test can be used)  for the duration of the COVID-19 declaration under Section 564(b)(1) of the Act, 21 U.S.C. section 360bbb-3(b)(1), unless the authorization is terminated or revoked.  Performed at [facility]  Lab, [address] [place], [place] [address]   Culture, blood (Routine X 2) w Reflex to ID Panel     Status: None   Collection Time: 03/21/21  5:30 AM   Specimen: BLOOD  Result Value Ref Range Status   Specimen Description BLOOD RIGHT HAND  Final   Special Requests   Final    BOTTLES DRAWN AEROBIC AND ANAEROBIC Blood Culture adequate volume   Culture   Final    NO GROWTH 5 DAYS Performed at [facility], [address] [place], [place] [address]    Report Status 03/26/2021 FINAL  Final  Culture, blood (Routine X 2) w Reflex to ID Panel     Status: None   Collection Time: 03/21/21  5:36 AM   Specimen: BLOOD  Result Value Ref Range Status   Specimen Description BLOOD LEFT HAND  Final   Special Requests   Final    BOTTLES DRAWN AEROBIC ONLY Blood Culture adequate volume   Culture   Final    NO GROWTH 5 DAYS Performed at [facility], [address], [place], [place] [address]    Report Status 03/26/2021 FINAL  Final  MRSA PCR Screening     Status: None   Collection Time: 03/25/21  7:50 PM   Specimen: Nasopharyngeal  Result Value Ref Range Status   MRSA by PCR NEGATIVE NEGATIVE Final    Comment:        The GeneXpert MRSA Assay (FDA approved for NASAL specimens only), is one component of a comprehensive MRSA colonization surveillance program. It is not intended to diagnose MRSA infection nor to guide or monitor treatment for MRSA infections. Performed at [facility], [address] [place], [place] [address]          Radiology Studies: DG Chest 1 View  Result Date: 03/25/2021 CLINICAL DATA:  Hypoxia. EXAM: CHEST  1 VIEW COMPARISON:  Mar 20, 2021. FINDINGS: The heart size and mediastinal contours are within normal limits. No  pneumothorax or pleural effusion is noted. Mild patchy airspace opacities are noted bilaterally concerning for possible pneumonia. The visualized skeletal structures are unremarkable. IMPRESSION: Possible bilateral pneumonia. Electronically Signed   By: Marijo Conception M.D.   On: 03/25/2021 19:14        Scheduled Meds: . bisacodyl  10 mg Rectal Once  . chlorhexidine  15 mL Mouth/Throat BID  . Chlorhexidine Gluconate Cloth  6 each Topical Daily  . feeding supplement (GLUCERNA SHAKE)  237 mL Oral BID BM  . finasteride  5 mg Oral BH-q7a  . guaiFENesin  600 mg Oral BID  . hydrocortisone sodium succinate  100 mg Intravenous Q8H  . insulin aspart  0-15 Units Subcutaneous TID WC  . insulin aspart  0-5 Units Subcutaneous QHS  . insulin glargine  5 Units Subcutaneous Daily  . mirtazapine  7.5 mg Oral QHS  . multivitamin with minerals  1 tablet Oral Daily  . nystatin  5 mL Oral QID  . pantoprazole  40 mg Oral QAC breakfast   Continuous Infusions: . ceFEPime (MAXIPIME) IV 2 g (03/27/21 1239)  . fluconazole (DIFLUCAN) IV Stopped (03/26/21 1420)  . levETIRAcetam 500 mg (03/27/21 0431)     LOS: 5 days    Time spent: 35 minutes.     Elmarie Shiley, MD [facility]   If 7PM-7AM, please contact night-coverage [email or web address]  03/27/2021, 1:39 PM
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
## 2021-03-27 NOTE — Progress Notes (Signed)
Inpatient Diabetes Program Recommendations  AACE/ADA: New Consensus Statement on Inpatient Glycemic Control   Target Ranges:  Prepandial:   less than 140 mg/dL      Peak postprandial:   less than 180 mg/dL (1-2 hours)      Critically ill patients:  140 - 180 mg/dL  Results for ABDURAHMAN, RUGG (MRN [id number]) as of 03/27/2021 11:40  Ref. Range 03/26/2021 07:38 03/26/2021 12:42 03/26/2021 15:55 03/26/2021 20:50 03/27/2021 07:44 03/27/2021 11:36  Glucose-Capillary Latest Ref Range: 70 - 99 mg/dL 202 (H) 243 (H) 268 (H) 172 (H) 235 (H) 273 (H)    Review of Glycemic Control  Current orders for Inpatient glycemic control: Lantus 5 units daily, Novolog 0-15 units TID with meals, Novolog 0-5 units QHS; Solucortef 100 mg Q8H  Inpatient Diabetes Program Recommendations:    Insulin: If steroids are continued as ordered, please consider increasing Lantus to 8 units daily.  Thanks, Barnie Alderman, RN, MSN, CDE Diabetes Coordinator Inpatient Diabetes Program [phone] (Team Pager from 8am to 5pm)
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
## 2021-03-28 ENCOUNTER — Telehealth: Payer: Self-pay

## 2021-03-28 DIAGNOSIS — Z7401 Bed confinement status: Secondary | ICD-10-CM | POA: Diagnosis not present

## 2021-03-28 DIAGNOSIS — E271 Primary adrenocortical insufficiency: Secondary | ICD-10-CM | POA: Diagnosis not present

## 2021-03-28 DIAGNOSIS — C7931 Secondary malignant neoplasm of brain: Secondary | ICD-10-CM | POA: Diagnosis not present

## 2021-03-28 DIAGNOSIS — I219 Acute myocardial infarction, unspecified: Secondary | ICD-10-CM | POA: Diagnosis not present

## 2021-03-28 DIAGNOSIS — K219 Gastro-esophageal reflux disease without esophagitis: Secondary | ICD-10-CM | POA: Diagnosis not present

## 2021-03-28 DIAGNOSIS — M2550 Pain in unspecified joint: Secondary | ICD-10-CM | POA: Diagnosis not present

## 2021-03-28 DIAGNOSIS — R627 Adult failure to thrive: Secondary | ICD-10-CM | POA: Diagnosis not present

## 2021-03-28 DIAGNOSIS — I10 Essential (primary) hypertension: Secondary | ICD-10-CM | POA: Diagnosis not present

## 2021-03-28 DIAGNOSIS — R739 Hyperglycemia, unspecified: Secondary | ICD-10-CM | POA: Diagnosis not present

## 2021-03-28 DIAGNOSIS — C7A8 Other malignant neuroendocrine tumors: Secondary | ICD-10-CM | POA: Diagnosis not present

## 2021-03-28 DIAGNOSIS — E43 Unspecified severe protein-calorie malnutrition: Secondary | ICD-10-CM | POA: Diagnosis not present

## 2021-03-28 DIAGNOSIS — E2740 Unspecified adrenocortical insufficiency: Secondary | ICD-10-CM | POA: Diagnosis not present

## 2021-03-28 DIAGNOSIS — R5381 Other malaise: Secondary | ICD-10-CM | POA: Diagnosis not present

## 2021-03-28 DIAGNOSIS — T451X5A Adverse effect of antineoplastic and immunosuppressive drugs, initial encounter: Secondary | ICD-10-CM | POA: Diagnosis not present

## 2021-03-28 DIAGNOSIS — D701 Agranulocytosis secondary to cancer chemotherapy: Secondary | ICD-10-CM | POA: Diagnosis not present

## 2021-03-28 DIAGNOSIS — E876 Hypokalemia: Secondary | ICD-10-CM | POA: Diagnosis not present

## 2021-03-28 DIAGNOSIS — E440 Moderate protein-calorie malnutrition: Secondary | ICD-10-CM | POA: Diagnosis not present

## 2021-03-28 DIAGNOSIS — R531 Weakness: Secondary | ICD-10-CM | POA: Diagnosis not present

## 2021-03-28 DIAGNOSIS — R0902 Hypoxemia: Secondary | ICD-10-CM | POA: Diagnosis not present

## 2021-03-28 LAB — BASIC METABOLIC PANEL
Anion gap: 10 (ref 5–15)
BUN: 13 mg/dL (ref 8–23)
CO2: 23 mmol/L (ref 22–32)
Calcium: 7.8 mg/dL — ABNORMAL LOW (ref 8.9–10.3)
Chloride: 103 mmol/L (ref 98–111)
Creatinine, Ser: 0.42 mg/dL — ABNORMAL LOW (ref 0.61–1.24)
GFR, Estimated: >60 mL/min (ref >60)
Glucose, Bld: 126 mg/dL — ABNORMAL HIGH (ref 70–99)
Potassium: 3.9 mmol/L (ref 3.5–5.1)
Sodium: 136 mmol/L (ref 135–145)

## 2021-03-28 LAB — COMPREHENSIVE METABOLIC PANEL
ALT: 69 U/L — ABNORMAL HIGH (ref 0–44)
AST: 25 U/L (ref 15–41)
Albumin: 2.3 g/dL — ABNORMAL LOW (ref 3.5–5.0)
Alkaline Phosphatase: 45 U/L (ref 38–126)
Anion gap: 10 (ref 5–15)
BUN: 12 mg/dL (ref 8–23)
CO2: 24 mmol/L (ref 22–32)
Calcium: 7.7 mg/dL — ABNORMAL LOW (ref 8.9–10.3)
Chloride: 105 mmol/L (ref 98–111)
Creatinine, Ser: 0.39 mg/dL — ABNORMAL LOW (ref 0.61–1.24)
GFR, Estimated: >60 mL/min (ref >60)
Glucose, Bld: 102 mg/dL — ABNORMAL HIGH (ref 70–99)
Potassium: 2.7 mmol/L — CL (ref 3.5–5.1)
Sodium: 139 mmol/L (ref 135–145)
Total Bilirubin: 0.9 mg/dL (ref 0.3–1.2)
Total Protein: 4.6 g/dL — ABNORMAL LOW (ref 6.5–8.1)

## 2021-03-28 LAB — GLUCOSE, CAPILLARY
Glucose-Capillary: 94 mg/dL (ref 70–99)
Glucose-Capillary: 123 mg/dL — ABNORMAL HIGH (ref 70–99)
Glucose-Capillary: 143 mg/dL — ABNORMAL HIGH (ref 70–99)
Glucose-Capillary: 197 mg/dL — ABNORMAL HIGH (ref 70–99)

## 2021-03-28 LAB — CBC
HCT: 25.5 % — ABNORMAL LOW (ref 39.0–52.0)
Hemoglobin: 9.3 g/dL — ABNORMAL LOW (ref 13.0–17.0)
MCH: 31.2 pg (ref 26.0–34.0)
MCHC: 36.5 g/dL — ABNORMAL HIGH (ref 30.0–36.0)
MCV: 85.6 fL (ref 80.0–100.0)
Platelets: 60 10*3/uL — ABNORMAL LOW (ref 150–400)
RBC: 2.98 MIL/uL — ABNORMAL LOW (ref 4.22–5.81)
RDW: 15.3 % (ref 11.5–15.5)
WBC: 10.1 10*3/uL (ref 4.0–10.5)
nRBC: 1.4 % — ABNORMAL HIGH (ref 0.0–0.2)

## 2021-03-28 LAB — TSH: TSH: 2.206 u[IU]/mL (ref 0.350–4.500)

## 2021-03-28 LAB — RESP PANEL BY RT-PCR (FLU A&B, COVID) ARPGX2
Influenza A by PCR: NEGATIVE
Influenza B by PCR: NEGATIVE
SARS Coronavirus 2 by RT PCR: NEGATIVE

## 2021-03-28 LAB — SARS CORONAVIRUS 2 (TAT 6-24 HRS): SARS Coronavirus 2: NEGATIVE

## 2021-03-28 LAB — T4, FREE: Free T4: 0.96 ng/dL (ref 0.61–1.12)

## 2021-03-28 MED ORDER — POTASSIUM CHLORIDE CRYS ER 20 MEQ PO TBCR
40.0000 meq | EXTENDED_RELEASE_TABLET | ORAL | Status: AC
  Administered 2021-03-28 (×2): 40 meq via ORAL
  Filled 2021-03-28 (×2): qty 2

## 2021-03-28 MED ORDER — MIRTAZAPINE 7.5 MG PO TABS: 7.5000 mg | ORAL_TABLET | Freq: Every day | ORAL | 0 refills | Status: DC

## 2021-03-28 MED ORDER — INSULIN ASPART 100 UNIT/ML IJ SOLN: 0.0000 [IU] | Freq: Three times a day (TID) | INTRAMUSCULAR | 11 refills | Status: DC

## 2021-03-28 MED ORDER — HYDROCORTISONE 5 MG PO TABS: 15.0000 mg | ORAL_TABLET | Freq: Every day | ORAL | 2 refills | Status: DC

## 2021-03-28 MED ORDER — PROCHLORPERAZINE MALEATE 10 MG PO TABS: 10.0000 mg | ORAL_TABLET | Freq: Four times a day (QID) | ORAL | 1 refills | Status: AC | PRN

## 2021-03-28 MED ORDER — LEVETIRACETAM 500 MG PO TABS: 500.0000 mg | ORAL_TABLET | Freq: Two times a day (BID) | ORAL | 1 refills | Status: DC

## 2021-03-28 MED ORDER — GUAIFENESIN ER 600 MG PO TB12: 600.0000 mg | ORAL_TABLET | Freq: Two times a day (BID) | ORAL | 0 refills | Status: DC

## 2021-03-28 MED ORDER — OXYCODONE-ACETAMINOPHEN 7.5-325 MG PO TABS: 1.0000 | ORAL_TABLET | Freq: Four times a day (QID) | ORAL | 0 refills | Status: AC | PRN

## 2021-03-28 MED ORDER — INSULIN GLARGINE 100 UNIT/ML ~~LOC~~ SOLN: 5.0000 [IU] | Freq: Every day | SUBCUTANEOUS | 11 refills | Status: DC

## 2021-03-28 MED ORDER — HEPARIN SOD (PORK) LOCK FLUSH 100 UNIT/ML IV SOLN
INTRAVENOUS | Status: AC
  Filled 2021-03-28: qty 5

## 2021-03-28 MED ORDER — POLYETHYLENE GLYCOL 3350 17 G PO PACK: 17.0000 g | PACK | Freq: Every day | ORAL | 0 refills | Status: AC

## 2021-03-28 MED ORDER — ADULT MULTIVITAMIN W/MINERALS CH: 1.0000 | ORAL_TABLET | Freq: Every day | ORAL | 0 refills | Status: DC

## 2021-03-28 MED ORDER — LEVOFLOXACIN 750 MG PO TABS: 750.0000 mg | ORAL_TABLET | Freq: Every day | ORAL | 0 refills | Status: AC

## 2021-03-28 MED ORDER — NYSTATIN 100000 UNIT/ML MT SUSP: 5.0000 mL | Freq: Four times a day (QID) | OROMUCOSAL | 0 refills | Status: DC

## 2021-03-28 MED ORDER — POTASSIUM CHLORIDE 10 MEQ/100ML IV SOLN
10.0000 meq | INTRAVENOUS | Status: AC
  Administered 2021-03-28 (×3): 10 meq via INTRAVENOUS
  Filled 2021-03-28 (×3): qty 100

## 2021-03-28 MED ORDER — POTASSIUM CHLORIDE CRYS ER 20 MEQ PO TBCR: 40.0000 meq | EXTENDED_RELEASE_TABLET | Freq: Two times a day (BID) | ORAL | 0 refills | Status: DC

## 2021-03-28 MED ORDER — HYDROCORTISONE 20 MG PO TABS: 20.0000 mg | ORAL_TABLET | Freq: Every day | ORAL | 1 refills | Status: DC

## 2021-03-28 NOTE — TOC Progression Note (Signed)
Transition of Care [facility]) - Progression Note    Patient Details  Name: Travis Marshall MRN: [id number] Date of Birth: Mar 08, 1946  Transition of Care [facility]) CM/SW [place], RN Phone Number: 03/28/2021, 1:36 PM  Clinical Narrative: To discharge via [facility] room 23-B.      Expected Discharge Plan: [place] Barriers to Discharge: Barriers Resolved  Expected Discharge Plan and Services Expected Discharge Plan: [place]   Discharge Planning Services: CM Consult Post Acute Care Choice: [place] Living arrangements for the past 2 months: Single Family Home Expected Discharge Date: 03/28/21               DME Arranged: N/A DME Agency: NA       HH Arranged: NA           Social Determinants of Health (SDOH) Interventions    Readmission Risk Interventions No flowsheet data found.

## 2021-03-28 NOTE — Telephone Encounter (Signed)
Per [place] from Dr. Tasia Catchings: he is going to be dc to SNF today. cancel next weeks chemo. I can still see him.   Message will be sent to scheduling pool to cancel next weeks tx:
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
## 2021-03-28 NOTE — Progress Notes (Signed)
[facility] arrived to Unit to transport pt. To [place] [facility]. Covid results were still pending, per lab the swab was sent out to [facility]. Another covid sample was sent to lab and results are pending. I tried several times to call [facility] to give report. Per desk "we are so short staff," I was on hold for long periods of times. I left the unit number for the nurse to call back and get report. I will contact [facility]  for transportation.

## 2021-03-28 NOTE — Discharge Summary (Signed)
Physician Discharge Summary  Travis Marshall BMW:[id number] DOB: 1946-06-02 DOA: 03/20/2021  PCP: Baxter Hire, MD  Admit date: 03/20/2021 Discharge date: 03/28/2021  Admitted From: Home  Disposition:  SNF  Recommendations for Outpatient Follow-up:  1. Follow up with PCP in 1-2 weeks 2. Please obtain BMP/CBC in one week 3. Needs B-met to follow potassium level.  4. Needs to follow up with endocrinologist for further evaluation of adrenal insufficiency.  5.    Discharge Condition: Stable.  CODE STATUS: Full code Diet recommendation: Dysphagia 3, carb modified.   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  Patient has not had a bowel movement in 1  Week.  Patient report poor oral intake, 30 pounds weight loss over the last 70-month  Patient received last chemotherapy on Mar 15, 2021 (carboplatinum, etoposide, Tecentriq) Patient receive UEllen Henrirecently.  Patient was found to have severe pancytopenia.  Patient presented also with diffuse weakness, failure to thrive.     1-Diffuse weakness, failure to thrive:  -Likely post chemo -Patient will need skilled nursing facility for rehab -Continue with PT.  -Component of adrenal insufficiency. Cortisol: 2.7 , ACTH less 1.5 Started on IV hydrocortisone, continue  -TSH: 0.109, Free T 3: 0.7 and T 4: 0.97. repeat TSH normal.  -Discussed with endocrinologist plan to repeat as an out patient.   2-Pancytopenia: Secondary to chemotherapy. Neutropenic.  -Patient received 1 unit of packed red blood cells irradiated on 5/18. -Patient also received Udenyca 5/16 (Form of granulocyte colony-stimulating factor). No indication for Granix per Dr YTasia Catchings  -WBC 3.8 -Received  one unit PRBC 5/22. Labs stable/   3-Hypomagnesemia; Replaced.   PNA; Oxygen decrease to 90. Place on 2 L oxygen.  Antibiotics change to cefepime. Continue with Cefepime day 4. Discharge on levaquin for one more days.    Hypokalemia; Replete orally. Check labs prior to transfer to SNF  Oral Candidiasis: Received  IV fluconazole and oral nystatin while in the hospital. Discharge on Nystatin.  Improving.    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 -On oral Keppra prophylaxis.   DM, Hyperglycemia: Secondary to steroids. HB-A1c at 9. (02/22/2021) On Lantus, SSI.   Urinary retention: Foley catheter placed 5/19. Foley removed 5/23. He was able to urinate.   Severe Constipation:  He has had several bowel movement after multiple enema. Resolved.   Hyponatremia; Mild. Resolved,..  Hyperbilirubinemia. Monitor.   Adrenal Insufficiency;  Cortisol low at 2,  He was on dexamethasone.  Received  IV hydrocortisone. Continue, change to Hydrocortisone BID t20 mg in am and 15 afternoon.  Referral made to endocrinologist Dr AFelipa EvenerSolum.       Discharge Diagnoses:  Active Problems:   Neuroendocrine cancer ([facility]   Acute hyponatremia   Palliative care encounter   Weakness   Hypomagnesemia   Hypokalemia   Brain metastasis (HCC)   Failure to thrive in adult   Chemotherapy-induced neutropenia (HCC)   Moderate malnutrition (HCC)   Pancytopenia (HCC)   Protein-calorie malnutrition, severe   Adrenal insufficiency ([facility]    Discharge Instructions  Discharge Instructions    Ambulatory referral to Endocrinology   Complete by: As directed    Diet - low sodium heart healthy   Complete by:  As directed    Increase activity slowly   Complete by: As directed      Allergies as of 03/28/2021      Reactions   Ace Inhibitors Other (See Comments)   Atorvastatin Other (See  Comments)      Medication List    STOP taking these medications   dexamethasone 4 MG tablet Commonly known as: DECADRON   diphenhydrAMINE 25 mg capsule Commonly known as: BENADRYL   Garlic 8185 MG Caps   ibuprofen 800 MG tablet Commonly known as: ADVIL   metFORMIN 500 MG tablet Commonly known as: GLUCOPHAGE   ondansetron 8 MG tablet Commonly known as: Zofran   Potassium 99 MG Tabs     TAKE these medications   aspirin EC 81 MG tablet Take 81 mg by mouth daily. Swallow whole.   finasteride 5 MG tablet Commonly known as: PROSCAR Take 5 mg by mouth every morning.   guaiFENesin 600 MG 12 hr tablet Commonly known as: MUCINEX Take 1 tablet (600 mg total) by mouth 2 (two) times daily.   hydrocortisone 5 MG tablet Commonly known as: CORTEF Take 3 tablets (15 mg total) by mouth daily at 4 PM.   hydrocortisone 20 MG tablet Commonly known as: CORTEF Take 1 tablet (20 mg total) by mouth daily.   insulin aspart 100 UNIT/ML injection Commonly known as: novoLOG Inject 0-15 Units into the skin 3 (three) times daily with meals. CBG 70 - 120: 0 units  CBG 121 - 150: 2 units  CBG 151 - 200: 3 units  CBG 201 - 250: 5 units  CBG 251 - 300: 8 units  CBG 301 - 350: 11 units  CBG 351 - 400: 15 units  CBG > 400 call MD and obtain STAT lab verification   insulin glargine 100 UNIT/ML injection Commonly known as: LANTUS Inject 0.05 mLs (5 Units total) into the skin daily. Start taking on: Mar 29, 2021   levETIRAcetam 500 MG tablet Commonly known as: KEPPRA Take 1 tablet (500 mg total) by mouth 2 (two) times daily.   levofloxacin 750 MG tablet Commonly known as: Levaquin Take 1 tablet (750 mg total) by mouth daily for 1 day.   lidocaine-prilocaine cream Commonly known as: EMLA Apply 1 application topically as needed.   metoprolol succinate 25 MG 24 hr tablet Commonly known as: TOPROL-XL Take 25 mg by mouth every morning.   mirtazapine 7.5 MG tablet Commonly known as:  REMERON Take 1 tablet (7.5 mg total) by mouth at bedtime.   multivitamin with minerals Tabs tablet Take 1 tablet by mouth daily.   nystatin 100000 UNIT/ML suspension Commonly known as: MYCOSTATIN Take 5 mLs (500,000 Units total) by mouth 4 (four) times daily.   omeprazole 20 MG capsule Commonly known as: PRILOSEC Take 20 mg by mouth every morning.   oxyCODONE-acetaminophen 7.5-325 MG tablet Commonly known as: PERCOCET Take 1 tablet by mouth every 6 (six) hours as needed for up to 3 days for severe pain. What changed:   when to take this  Another medication with the same name was removed. Continue taking this medication, and follow the directions you see here.   polyethylene glycol 17 g packet Commonly known as: MIRALAX / GLYCOLAX Take 17 g by mouth daily.   potassium chloride SA 20 MEQ tablet Commonly known as: KLOR-CON Take 2 tablets (40 mEq total) by mouth 2 (two) times daily for 8 days.   pravastatin 40 MG tablet Commonly known as: PRAVACHOL Take 40 mg by  mouth every morning.   prochlorperazine 10 MG tablet Commonly known as: COMPAZINE Take 1 tablet (10 mg total) by mouth every 6 (six) hours as needed (Nausea or vomiting).   Procysbi 300 MG Pack Generic drug: Cysteamine Bitartrate See admin instructions.       Contact information for after-discharge care    [facility] .   Service: Skilled Nursing Contact information: [place] [phone]                 Allergies  Allergen Reactions  . Ace Inhibitors Other (See Comments)  . Atorvastatin Other (See Comments)    Consultations:  Oncologist    Procedures/Studies: CT ABDOMEN PELVIS WO CONTRAST  Result Date: 03/20/2021 CLINICAL DATA:  Known high-grade neuroendocrine carcinoma with abdominal distension, initial encounter EXAM: CT ABDOMEN AND PELVIS WITHOUT CONTRAST TECHNIQUE: Multidetector CT imaging of the  abdomen and pelvis was performed following the standard protocol without IV contrast. COMPARISON:  01/23/2021 FINDINGS: Lower chest: Previously seen right lower lobe mass is again identified but has decreased in size significantly now measuring approximately 2.4 by 1.3 cm in greatest dimension. It previously measured 4.5 cm in dimension. No sizable effusion is seen. Hepatobiliary: No focal liver abnormality is seen. No gallstones, gallbladder wall thickening, or biliary dilatation. Pancreas: Unremarkable. No pancreatic ductal dilatation or surrounding inflammatory changes. Spleen: Normal in size without focal abnormality. Adrenals/Urinary Tract: Adrenal glands are within normal limits. Kidneys are well visualized bilaterally. Tiny 1-2 mm stone is noted in the midportion of the left kidney. No mass lesion or hydronephrosis is noted. The bladder is partially distended. Stomach/Bowel: Scattered diverticular change of the colon is noted. Mild retained fecal material is seen. No obstructive or inflammatory changes are noted. The appendix has been surgically removed. Small bowel and stomach appear within normal limits. Vascular/Lymphatic: Atherosclerotic calcifications are noted. The previously seen retrocaval lymph node has decreased in size now measuring 3-4 mm in short axis decreased from 11 mm on the prior exam. Reproductive: Prostate is unremarkable. Other: No abdominal wall hernia or abnormality. No abdominopelvic ascites. Musculoskeletal: No acute or significant osseous findings. IMPRESSION: Interval reduction in size of right lower lobe mass and retrocaval lymph nodes seen on prior exam. Tiny 1-2 mm stone in the left kidney without obstructive change. Changes of mild diverticulosis without diverticulitis. Electronically Signed   By: Inez Catalina M.D.   On: 03/20/2021 19:08   DG Chest 1 View  Result Date: 03/25/2021 CLINICAL DATA:  Hypoxia. EXAM: CHEST  1 VIEW COMPARISON:  Mar 20, 2021. FINDINGS: The heart size  and mediastinal contours are within normal limits. No pneumothorax or pleural effusion is noted. Mild patchy airspace opacities are noted bilaterally concerning for possible pneumonia. The visualized skeletal structures are unremarkable. IMPRESSION: Possible bilateral pneumonia. Electronically Signed   By: Marijo Conception M.D.   On: 03/25/2021 19:14   DG Chest 2 View  Result Date: 03/20/2021 CLINICAL DATA:  Possible sepsis. Weakness. Neuroendocrine carcinoma. EXAM: CHEST - 2 VIEW COMPARISON:  01/10/2021 chest radiograph. A PET of 01/23/2021 is also reviewed. FINDINGS: Right Port-A-Cath tip high right atrium. Patient rotated right. Midline trachea. Normal heart size. Tortuous thoracic aorta. No pleural effusion or pneumothorax. Basilar predominant interstitial thickening is likely related to interstitial lung disease when correlated with prior PET. No lobar consolidation. Lateral view degraded by patient arm position. IMPRESSION: No evidence of pneumonia. 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 Electronically Signed   By: Abigail Miyamoto M.D.   On: 03/20/2021 18:55   CT Head Wo Contrast  Result Date: 03/20/2021 CLINICAL DATA:  History of known brain metastatic disease with neuroendocrine carcinoma, this shell encounter EXAM: CT HEAD WITHOUT CONTRAST TECHNIQUE: Contiguous axial images were obtained from the base of the skull through the vertex without intravenous contrast. COMPARISON:  01/20/2021 MRI FINDINGS: Brain: Previously seen metastatic disease is not well appreciated on this exam. No surrounding white matter edema to suggest persistent large metastatic disease is noted. Mild atrophic changes are noted. No findings to suggest acute hemorrhage or acute infarction are noted. Vascular: No hyperdense vessel or unexpected calcification. Skull: Normal. Negative for fracture or focal lesion. Sinuses/Orbits: No acute finding. Other: None. IMPRESSION:  Previously seen metastatic disease is not well appreciated on today's exam. No acute abnormality is noted. Mild atrophic changes. Electronically Signed   By: Inez Catalina M.D.   On: 03/20/2021 19:10   MR Brain W and Wo Contrast  Result Date: 03/20/2021 CLINICAL DATA:  Progressive weakness for 2 months. 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 Prior whole brain radiation. EXAM: MRI HEAD WITHOUT AND WITH CONTRAST TECHNIQUE: Multiplanar, multiecho pulse sequences of the brain and surrounding structures were obtained without and with intravenous contrast. CONTRAST:  33m GADAVIST GADOBUTROL 1 MMOL/ML IV SOLN COMPARISON:  01/20/2021 FINDINGS: Brain: No acute infarct, mass effect or extra-axial collection. 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 There is multifocal hyperintense T2-weighted signal within the white matter. Parenchymal volume and CSF spaces are normal. The midline structures are normal. Posterior left temporal lesion has decreased in size (series 18, image 90), now measuring 90 mm. Inferior left cerebellar lesion has also decreased in size and now measures 3 mm (image 40). The other lesions demonstrated on the previous MRI are no longer visible. There are no new lesions. There is no perilesional edema. Vascular: Major flow voids are preserved. Skull and upper cervical spine: Normal calvarium and skull base. Visualized upper cervical spine and soft tissues are normal. Sinuses/Orbits:No paranasal sinus fluid levels or advanced mucosal thickening. No mastoid or middle ear effusion. Normal orbits. IMPRESSION: 1. Positive response to therapy with decreased size of 2 lesions located in the posterior left temporal lobe and inferior left cerebellar hemisphere. The other lesions have resolved. There are no new lesions. 2. No acute intracranial abnormality. Electronically Signed   By: KUlyses JarredM.D.   On: 03/20/2021 23:15    Subjective: He is  eating a little be more.  Denies pain.   Discharge Exam: Vitals:   03/27/21 2353 03/28/21 0423  BP: 121/64 (!) 105/54  Pulse: 63 (!) 58  Resp: 16 20  Temp: 98.1 F (36.7 C) 98.7 F (37.1 C)  SpO2: 95% 96%     General: Pt is alert, awake, not in acute distress Cardiovascular: RRR, S1/S2 +, no rubs, no gallops Respiratory: CTA bilaterally, no wheezing, no rhonchi Abdominal: Soft, NT, ND, bowel sounds + Extremities: no edema, no cyanosis    The results of significant diagnostics from this hospitalization (including imaging, microbiology, ancillary and laboratory) are listed below for reference.     Microbiology: Recent Results (from the past 240 hour(s))  Resp Panel by RT-PCR (Flu A&B, Covid) Nasopharyngeal Swab     Status: None   Collection Time: 03/20/21  5:08 PM   Specimen: Nasopharyngeal Swab; Nasopharyngeal(NP) swabs in vial transport medium  Result Value Ref Range Status   SARS  Coronavirus 2 by RT PCR NEGATIVE NEGATIVE Final    Comment: (NOTE) SARS-CoV-2 target nucleic acids are NOT DETECTED.  The SARS-CoV-2 RNA is generally detectable in upper respiratory specimens during the acute phase of infection. The lowest concentration of SARS-CoV-2 viral copies this assay can detect is 138 copies/mL. A negative result does not preclude SARS-Cov-2 infection and should not be used as the sole basis for treatment or other patient management decisions. A negative result may occur with  improper specimen collection/handling, submission of specimen other than nasopharyngeal swab, presence of viral mutation(s) within the areas targeted by this assay, and inadequate number of viral copies(<138 copies/mL). A negative result must be combined with clinical observations, patient history, and epidemiological information. The expected result is Negative.  Fact Sheet for Patients:  EntrepreneurPulse.com.au  Fact Sheet for Healthcare Providers:   IncredibleEmployment.be  This test is no t yet approved or cleared by the [place] FDA and  has been authorized for detection and/or diagnosis of SARS-CoV-2 by FDA under an Emergency Use Authorization (EUA). This EUA will remain  in effect (meaning this test can be used) for the duration of the COVID-19 declaration under Section 564(b)(1) of the Act, 21 U.S.C.section 360bbb-3(b)(1), unless the authorization is terminated  or revoked sooner.       Influenza A by PCR NEGATIVE NEGATIVE Final   Influenza B by PCR NEGATIVE NEGATIVE Final    Comment: (NOTE) The Xpert Xpress SARS-CoV-2/FLU/RSV plus assay is intended as an aid in the diagnosis of influenza from Nasopharyngeal swab specimens and should not be used as a sole basis for treatment. Nasal washings and aspirates are unacceptable for Xpert Xpress SARS-CoV-2/FLU/RSV testing.  Fact Sheet for Patients: EntrepreneurPulse.com.au  Fact Sheet for Healthcare Providers: IncredibleEmployment.be  This test is not yet approved or cleared by the [place] FDA and has been authorized for detection and/or diagnosis of SARS-CoV-2 by FDA under an Emergency Use Authorization (EUA). This EUA will remain in effect (meaning this test can be used) for the duration of the COVID-19 declaration under Section 564(b)(1) of the Act, 21 U.S.C. section 360bbb-3(b)(1), unless the authorization is terminated or revoked.  Performed at [facility], [address] [place], [place] [address]   Culture, blood (Routine X 2) w Reflex to ID Panel     Status: None   Collection Time: 03/21/21  5:30 AM   Specimen: BLOOD  Result Value Ref Range Status   Specimen Description BLOOD RIGHT HAND  Final   Special Requests   Final    BOTTLES DRAWN AEROBIC AND ANAEROBIC Blood Culture adequate volume   Culture   Final    NO GROWTH 5 DAYS Performed at [facility], [address],  [place], [place] [address]    Report Status 03/26/2021 FINAL  Final  Culture, blood (Routine X 2) w Reflex to ID Panel     Status: None   Collection Time: 03/21/21  5:36 AM   Specimen: BLOOD  Result Value Ref Range Status   Specimen Description BLOOD LEFT HAND  Final   Special Requests   Final    BOTTLES DRAWN AEROBIC ONLY Blood Culture adequate volume   Culture   Final    NO GROWTH 5 DAYS Performed at [facility], [address], [place], [place] [address]    Report Status 03/26/2021 FINAL  Final  MRSA PCR Screening     Status: None   Collection Time: 03/25/21  7:50 PM   Specimen: Nasopharyngeal  Result Value  Ref Range Status   MRSA by PCR NEGATIVE NEGATIVE Final    Comment:        The GeneXpert MRSA Assay (FDA approved for NASAL specimens only), is one component of a comprehensive MRSA colonization surveillance program. It is not intended to diagnose MRSA infection nor to guide or monitor treatment for MRSA infections. Performed at [facility], [address] [place], [place] [address]      Labs: BNP (last 3 results) No results for input(s): BNP in the last 8760 hours. Basic Metabolic Panel: Recent Labs  Lab 03/22/21 0508 03/23/21 0515 03/24/21 0855 03/25/21 0548 03/26/21 0530 03/26/21 0817 03/27/21 0441 03/28/21 0625  NA 134*   < > 133* 135 137  --  137 139  K 3.9   < > 3.2* 3.2* 3.2*  --  3.4* 2.7*  CL 103   < > 102 104 108  --  109 105  CO2 24   < > 23 23 22   --  21* 24  GLUCOSE 305*   < > 174* 136* 191*  --  259* 102*  BUN 10   < > 20 13 13   --  16 12  CREATININE 0.36*   < > 0.38* 0.37* 0.30*  --  0.35* 0.39*  CALCIUM 8.1*   < > 7.9* 7.6* 7.5*  --  7.7* 7.7*  MG 2.0  --   --  1.5*  --  2.1  --   --    < > = values in this interval not displayed.   Liver Function Tests: Recent Labs  Lab 03/24/21 0855 03/25/21 0548 03/26/21 0530 03/27/21 0441 03/28/21 0625  AST 13* 16 39 36 25  ALT 21 23 60* 86* 69*  ALKPHOS 33* 33* 31* 37* 45   BILITOT 1.6* 1.3* 1.1 0.8 0.9  PROT 4.9* 4.8* 4.5* 4.5* 4.6*  ALBUMIN 2.5* 2.3* 2.1* 2.2* 2.3*   No results for input(s): LIPASE, AMYLASE in the last 168 hours. No results for input(s): AMMONIA in the last 168 hours. CBC: Recent Labs  Lab 03/24/21 0855 03/25/21 0548 03/26/21 0530 03/26/21 1910 03/27/21 0441 03/28/21 0625  WBC 0.4* 0.5* 0.9* 2.8* 3.9*  3.8* 10.1  NEUTROABS 0.0* 0.2*  --  1.9 1.8  --   HGB 9.5* 8.1* 7.1* 8.0* 8.5*  8.3* 9.3*  HCT 25.4* 21.6* 19.3* 22.6* 23.4*  22.8* 25.5*  MCV 85.8 87.1 87.3 87.3 88.0  87.0 85.6  PLT 44* 33* 35* 42* 47*  47* 60*   Cardiac Enzymes: No results for input(s): CKTOTAL, CKMB, CKMBINDEX, TROPONINI in the last 168 hours. BNP: Invalid input(s): POCBNP CBG: Recent Labs  Lab 03/27/21 0744 03/27/21 1136 03/27/21 1541 03/27/21 2116 03/28/21 0732  GLUCAP 235* 273* 204* 175* 94   D-Dimer No results for input(s): DDIMER in the last 72 hours. Hgb A1c No results for input(s): HGBA1C in the last 72 hours. Lipid Profile No results for input(s): CHOL, HDL, LDLCALC, TRIG, CHOLHDL, LDLDIRECT in the last 72 hours. Thyroid function studies Recent Labs    03/26/21 0817 03/28/21 0625  TSH  --  2.206  T3FREE 0.7*  --    Anemia work up No results for input(s): VITAMINB12, FOLATE, FERRITIN, TIBC, IRON, RETICCTPCT in the last 72 hours. Urinalysis    Component Value Date/Time   COLORURINE YELLOW (A) 02/22/2021 1016   APPEARANCEUR CLEAR (A) 02/22/2021 1016   LABSPEC 1.040 (H) 02/22/2021 1016   PHURINE 5.0 02/22/2021 1016   GLUCOSEU >=500 (A) 02/22/2021 1016  HGBUR SMALL (A) 02/22/2021 1016   BILIRUBINUR NEGATIVE 02/22/2021 1016   KETONESUR 5 (A) 02/22/2021 1016   PROTEINUR NEGATIVE 02/22/2021 1016   NITRITE NEGATIVE 02/22/2021 1016   LEUKOCYTESUR NEGATIVE 02/22/2021 1016   Sepsis Labs Invalid input(s): PROCALCITONIN,  WBC,  LACTICIDVEN Microbiology Recent Results (from the past 240 hour(s))  Resp Panel by RT-PCR (Flu A&B,  Covid) Nasopharyngeal Swab     Status: None   Collection Time: 03/20/21  5:08 PM   Specimen: Nasopharyngeal Swab; Nasopharyngeal(NP) swabs in vial transport medium  Result Value Ref Range Status   SARS Coronavirus 2 by RT PCR NEGATIVE NEGATIVE Final    Comment: (NOTE) SARS-CoV-2 target nucleic acids are NOT DETECTED.  The SARS-CoV-2 RNA is generally detectable in upper respiratory specimens during the acute phase of infection. The lowest concentration of SARS-CoV-2 viral copies this assay can detect is 138 copies/mL. A negative result does not preclude SARS-Cov-2 infection and should not be used as the sole basis for treatment or other patient management decisions. A negative result may occur with  improper specimen collection/handling, submission of specimen other than nasopharyngeal swab, presence of viral mutation(s) within the areas targeted by this assay, and inadequate number of viral copies(<138 copies/mL). A negative result must be combined with clinical observations, patient history, and epidemiological information. The expected result is Negative.  Fact Sheet for Patients:  EntrepreneurPulse.com.au  Fact Sheet for Healthcare Providers:  IncredibleEmployment.be  This test is no t yet approved or cleared by the [place] FDA and  has been authorized for detection and/or diagnosis of SARS-CoV-2 by FDA under an Emergency Use Authorization (EUA). This EUA will remain  in effect (meaning this test can be used) for the duration of the COVID-19 declaration under Section 564(b)(1) of the Act, 21 U.S.C.section 360bbb-3(b)(1), unless the authorization is terminated  or revoked sooner.       Influenza A by PCR NEGATIVE NEGATIVE Final   Influenza B by PCR NEGATIVE NEGATIVE Final    Comment: (NOTE) The Xpert Xpress SARS-CoV-2/FLU/RSV plus assay is intended as an aid in the diagnosis of influenza from Nasopharyngeal swab specimens and should  not be used as a sole basis for treatment. Nasal washings and aspirates are unacceptable for Xpert Xpress SARS-CoV-2/FLU/RSV testing.  Fact Sheet for Patients: EntrepreneurPulse.com.au  Fact Sheet for Healthcare Providers: IncredibleEmployment.be  This test is not yet approved or cleared by the [place] FDA and has been authorized for detection and/or diagnosis of SARS-CoV-2 by FDA under an Emergency Use Authorization (EUA). This EUA will remain in effect (meaning this test can be used) for the duration of the COVID-19 declaration under Section 564(b)(1) of the Act, 21 U.S.C. section 360bbb-3(b)(1), unless the authorization is terminated or revoked.  Performed at [facility], [address] [place], [place] [address]   Culture, blood (Routine X 2) w Reflex to ID Panel     Status: None   Collection Time: 03/21/21  5:30 AM   Specimen: BLOOD  Result Value Ref Range Status   Specimen Description BLOOD RIGHT HAND  Final   Special Requests   Final    BOTTLES DRAWN AEROBIC AND ANAEROBIC Blood Culture adequate volume   Culture   Final    NO GROWTH 5 DAYS Performed at [facility], [address] [place], [place] [address]    Report Status 03/26/2021 FINAL  Final  Culture, blood (Routine X 2) w Reflex to ID Panel     Status: None   Collection Time: 03/21/21  5:36 AM   Specimen: BLOOD  Result Value Ref Range Status   Specimen Description BLOOD LEFT HAND  Final   Special Requests   Final    BOTTLES DRAWN AEROBIC ONLY Blood Culture adequate volume   Culture   Final    NO GROWTH 5 DAYS Performed at [facility], [address] [place], [place] [address]    Report Status 03/26/2021 FINAL  Final  MRSA PCR Screening     Status: None   Collection Time: 03/25/21  7:50 PM   Specimen: Nasopharyngeal  Result Value Ref Range Status   MRSA by PCR NEGATIVE NEGATIVE Final    Comment:        The GeneXpert MRSA Assay  (FDA approved for NASAL specimens only), is one component of a comprehensive MRSA colonization surveillance program. It is not intended to diagnose MRSA infection nor to guide or monitor treatment for MRSA infections. Performed at [facility], [address], [place],  [address]      Time coordinating discharge: 40 minutes  SIGNED:   Elmarie Shiley, MD  [facility]

## 2021-03-29 ENCOUNTER — Encounter: Payer: Self-pay | Admitting: Oncology

## 2021-03-29 LAB — PROLACTIN: Prolactin: 8.2 ng/mL (ref 4.0–15.2)

## 2021-03-31 ENCOUNTER — Other Ambulatory Visit: Payer: Self-pay

## 2021-03-31 NOTE — Patient Outreach (Signed)
[facility]) Care Management  03/31/2021  Travis Marshall 18-Nov-1945 [id number]   Referral Date: 03/31/21 Referral Source: [employer] Report Date of Discharge: 03/29/21 Facility:  [place]: [facility]   Referral received.  No outreach warranted at this time.  Transition of Care calls being completed via EMMI. RN CM will outreach patient for any red flags received.    Plan: RN CM will close case.    Jone Baseman, RN, MSN [facility] Care Management Care Management Coordinator Direct Line [phone] Toll Free: [phone]  Fax: [phone]

## 2021-04-04 ENCOUNTER — Telehealth: Payer: Self-pay | Admitting: Adult Health Nurse Practitioner

## 2021-04-04 NOTE — Telephone Encounter (Signed)
Spoke with patient's wife, Enid Derry, to see if she was in agreement with rescheduling the Palliative Consult, (wife signed patient out of [facility] after being discharged there from [facility] on 03/20/21) and she was in agreement with scheduling visit.  I have scheduled an In-home Consult for 04/06/21 @ 9 AM.

## 2021-04-05 ENCOUNTER — Inpatient Hospital Stay: Payer: Medicare HMO

## 2021-04-05 ENCOUNTER — Other Ambulatory Visit: Payer: Self-pay | Admitting: *Deleted

## 2021-04-05 ENCOUNTER — Ambulatory Visit: Payer: Medicare HMO | Admitting: Radiation Oncology

## 2021-04-05 ENCOUNTER — Telehealth: Payer: Self-pay

## 2021-04-05 ENCOUNTER — Inpatient Hospital Stay: Payer: Medicare HMO | Attending: Oncology

## 2021-04-05 ENCOUNTER — Inpatient Hospital Stay (HOSPITAL_BASED_OUTPATIENT_CLINIC_OR_DEPARTMENT_OTHER): Payer: Medicare HMO | Admitting: Hospice and Palliative Medicine

## 2021-04-05 ENCOUNTER — Other Ambulatory Visit: Payer: Self-pay

## 2021-04-05 ENCOUNTER — Encounter: Payer: Self-pay | Admitting: Oncology

## 2021-04-05 ENCOUNTER — Inpatient Hospital Stay (HOSPITAL_BASED_OUTPATIENT_CLINIC_OR_DEPARTMENT_OTHER): Payer: Medicare HMO | Admitting: Oncology

## 2021-04-05 VITALS — BP 98/60 | HR 87 | Temp 99.7°F | Resp 20 | Wt 138.6 lb

## 2021-04-05 VITALS — BP 99/63 | HR 65 | Temp 99.1°F | Resp 18

## 2021-04-05 DIAGNOSIS — Z515 Encounter for palliative care: Secondary | ICD-10-CM

## 2021-04-05 DIAGNOSIS — R590 Localized enlarged lymph nodes: Secondary | ICD-10-CM | POA: Insufficient documentation

## 2021-04-05 DIAGNOSIS — R109 Unspecified abdominal pain: Secondary | ICD-10-CM | POA: Insufficient documentation

## 2021-04-05 DIAGNOSIS — R197 Diarrhea, unspecified: Secondary | ICD-10-CM | POA: Insufficient documentation

## 2021-04-05 DIAGNOSIS — R0602 Shortness of breath: Secondary | ICD-10-CM | POA: Insufficient documentation

## 2021-04-05 DIAGNOSIS — C7A1 Malignant poorly differentiated neuroendocrine tumors: Secondary | ICD-10-CM

## 2021-04-05 DIAGNOSIS — I2510 Atherosclerotic heart disease of native coronary artery without angina pectoris: Secondary | ICD-10-CM | POA: Insufficient documentation

## 2021-04-05 DIAGNOSIS — R627 Adult failure to thrive: Secondary | ICD-10-CM | POA: Diagnosis not present

## 2021-04-05 DIAGNOSIS — R0902 Hypoxemia: Secondary | ICD-10-CM | POA: Diagnosis not present

## 2021-04-05 DIAGNOSIS — C7802 Secondary malignant neoplasm of left lung: Secondary | ICD-10-CM | POA: Diagnosis not present

## 2021-04-05 DIAGNOSIS — C7B8 Other secondary neuroendocrine tumors: Secondary | ICD-10-CM | POA: Insufficient documentation

## 2021-04-05 DIAGNOSIS — C7931 Secondary malignant neoplasm of brain: Secondary | ICD-10-CM | POA: Diagnosis not present

## 2021-04-05 DIAGNOSIS — E861 Hypovolemia: Secondary | ICD-10-CM | POA: Diagnosis not present

## 2021-04-05 DIAGNOSIS — E2740 Unspecified adrenocortical insufficiency: Secondary | ICD-10-CM

## 2021-04-05 DIAGNOSIS — I10 Essential (primary) hypertension: Secondary | ICD-10-CM | POA: Insufficient documentation

## 2021-04-05 DIAGNOSIS — Z9049 Acquired absence of other specified parts of digestive tract: Secondary | ICD-10-CM | POA: Insufficient documentation

## 2021-04-05 DIAGNOSIS — E876 Hypokalemia: Secondary | ICD-10-CM | POA: Insufficient documentation

## 2021-04-05 DIAGNOSIS — R531 Weakness: Secondary | ICD-10-CM | POA: Insufficient documentation

## 2021-04-05 DIAGNOSIS — I252 Old myocardial infarction: Secondary | ICD-10-CM | POA: Diagnosis not present

## 2021-04-05 DIAGNOSIS — Z87891 Personal history of nicotine dependence: Secondary | ICD-10-CM | POA: Diagnosis not present

## 2021-04-05 DIAGNOSIS — R634 Abnormal weight loss: Secondary | ICD-10-CM | POA: Diagnosis not present

## 2021-04-05 DIAGNOSIS — R918 Other nonspecific abnormal finding of lung field: Secondary | ICD-10-CM | POA: Diagnosis not present

## 2021-04-05 DIAGNOSIS — R739 Hyperglycemia, unspecified: Secondary | ICD-10-CM | POA: Insufficient documentation

## 2021-04-05 DIAGNOSIS — R5383 Other fatigue: Secondary | ICD-10-CM | POA: Diagnosis not present

## 2021-04-05 DIAGNOSIS — R42 Dizziness and giddiness: Secondary | ICD-10-CM | POA: Insufficient documentation

## 2021-04-05 DIAGNOSIS — Z801 Family history of malignant neoplasm of trachea, bronchus and lung: Secondary | ICD-10-CM | POA: Diagnosis not present

## 2021-04-05 DIAGNOSIS — E860 Dehydration: Secondary | ICD-10-CM

## 2021-04-05 DIAGNOSIS — Z809 Family history of malignant neoplasm, unspecified: Secondary | ICD-10-CM | POA: Insufficient documentation

## 2021-04-05 DIAGNOSIS — E43 Unspecified severe protein-calorie malnutrition: Secondary | ICD-10-CM

## 2021-04-05 DIAGNOSIS — Z79899 Other long term (current) drug therapy: Secondary | ICD-10-CM | POA: Diagnosis not present

## 2021-04-05 LAB — COMPREHENSIVE METABOLIC PANEL
ALT: 30 U/L (ref 0–44)
AST: 30 U/L (ref 15–41)
Albumin: 2.5 g/dL — ABNORMAL LOW (ref 3.5–5.0)
Alkaline Phosphatase: 75 U/L (ref 38–126)
Anion gap: 13 (ref 5–15)
BUN: 8 mg/dL (ref 8–23)
CO2: 22 mmol/L (ref 22–32)
Calcium: 7.7 mg/dL — ABNORMAL LOW (ref 8.9–10.3)
Chloride: 100 mmol/L (ref 98–111)
Creatinine, Ser: 0.54 mg/dL — ABNORMAL LOW (ref 0.61–1.24)
GFR, Estimated: >60 mL/min (ref >60)
Glucose, Bld: 190 mg/dL — ABNORMAL HIGH (ref 70–99)
Potassium: 2.9 mmol/L — ABNORMAL LOW (ref 3.5–5.1)
Sodium: 135 mmol/L (ref 135–145)
Total Bilirubin: 1.1 mg/dL (ref 0.3–1.2)
Total Protein: 5.4 g/dL — ABNORMAL LOW (ref 6.5–8.1)

## 2021-04-05 LAB — CBC WITH DIFFERENTIAL/PLATELET
Abs Immature Granulocytes: 1.12 10*3/uL — ABNORMAL HIGH (ref 0.00–0.07)
Basophils Absolute: 0.1 10*3/uL (ref 0.0–0.1)
Basophils Relative: 1 %
Eosinophils Absolute: 0 10*3/uL (ref 0.0–0.5)
Eosinophils Relative: 0 %
HCT: 25.7 % — ABNORMAL LOW (ref 39.0–52.0)
Hemoglobin: 9.1 g/dL — ABNORMAL LOW (ref 13.0–17.0)
Immature Granulocytes: 8 %
Lymphocytes Relative: 14 %
Lymphs Abs: 2 10*3/uL (ref 0.7–4.0)
MCH: 31.2 pg (ref 26.0–34.0)
MCHC: 35.4 g/dL (ref 30.0–36.0)
MCV: 88 fL (ref 80.0–100.0)
Monocytes Absolute: 0.9 10*3/uL (ref 0.1–1.0)
Monocytes Relative: 6 %
Neutro Abs: 10.4 10*3/uL — ABNORMAL HIGH (ref 1.7–7.7)
Neutrophils Relative %: 71 %
Platelets: 237 10*3/uL (ref 150–400)
RBC: 2.92 MIL/uL — ABNORMAL LOW (ref 4.22–5.81)
RDW: 16.8 % — ABNORMAL HIGH (ref 11.5–15.5)
WBC: 14.5 10*3/uL — ABNORMAL HIGH (ref 4.0–10.5)
nRBC: 2.1 % — ABNORMAL HIGH (ref 0.0–0.2)

## 2021-04-05 MED ORDER — POTASSIUM CHLORIDE 20 MEQ/100ML IV SOLN: 20.0000 meq | Freq: Once | INTRAVENOUS | Status: DC

## 2021-04-05 MED ORDER — SODIUM CHLORIDE 0.9% FLUSH
10.0000 mL | INTRAVENOUS | Status: DC | PRN
  Administered 2021-04-05: 10 mL via INTRAVENOUS
  Filled 2021-04-05: qty 10

## 2021-04-05 MED ORDER — POTASSIUM CHLORIDE IN NACL 40-0.9 MEQ/L-% IV SOLN
Freq: Once | INTRAVENOUS | Status: AC
  Filled 2021-04-05: qty 1000

## 2021-04-05 MED ORDER — POTASSIUM CHLORIDE CRYS ER 20 MEQ PO TBCR: 40.0000 meq | EXTENDED_RELEASE_TABLET | Freq: Two times a day (BID) | ORAL | 0 refills | Status: DC

## 2021-04-05 MED ORDER — HYDROCORTISONE 20 MG PO TABS: 20.0000 mg | ORAL_TABLET | Freq: Every day | ORAL | 1 refills | Status: AC

## 2021-04-05 MED ORDER — LEVETIRACETAM 500 MG PO TABS: 500.0000 mg | ORAL_TABLET | Freq: Two times a day (BID) | ORAL | 1 refills | Status: DC

## 2021-04-05 MED ORDER — SODIUM CHLORIDE 0.9 % IV SOLN: 40.0000 meq | Freq: Once | INTRAVENOUS | Status: DC

## 2021-04-05 MED ORDER — SODIUM CHLORIDE 0.9 % IV SOLN
Freq: Once | INTRAVENOUS | Status: AC
  Filled 2021-04-05: qty 250

## 2021-04-05 MED ORDER — HEPARIN SOD (PORK) LOCK FLUSH 100 UNIT/ML IV SOLN
500.0000 [IU] | Freq: Once | INTRAVENOUS | Status: AC
  Administered 2021-04-05: 500 [IU] via INTRAVENOUS
  Filled 2021-04-05: qty 5

## 2021-04-05 MED ORDER — HYDROCORTISONE 5 MG PO TABS: 15.0000 mg | ORAL_TABLET | Freq: Every day | ORAL | 2 refills | Status: DC
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
## 2021-04-05 NOTE — Telephone Encounter (Signed)
[facility] has been unsuccessful in getting in touch with the patient. Have him call the office at [facility].   This information has been communicated to patient's wife.

## 2021-04-05 NOTE — Progress Notes (Signed)
Patient receiving IV hydration with Potassium added on to treatment plan for today. Patient discharged to home via wheelchair. Accompanied by wife. Assisted pt into car as he is very weak.

## 2021-04-05 NOTE — Patient Instructions (Signed)
Potassium chloride injection What is this medicine? POTASSIUM CHLORIDE (poe TASS i um KLOOR ide) is a potassium supplement used to prevent and to treat low potassium. Potassium is important for the heart, muscles, and nerves. Too much or too little potassium in the body can cause serious problems. This medicine may be used for other purposes; ask your health care provider or pharmacist if you have questions. COMMON BRAND NAME(S): PROAMP What should I tell my health care provider before I take this medicine? They need to know if you have any of these conditions:  Addison disease  dehydration  diabetes (high blood sugar)  heart disease  high levels of potassium in the blood  irregular heartbeat or rhythm  kidney disease  large areas of burned skin  an unusual or allergic reaction to potassium, other medicines, foods, dyes, or preservatives  pregnant or trying to get pregnant  breast-feeding How should I use this medicine? This medicine is injected into a vein. It is given by a health care provider in a hospital or clinic setting. Talk to your health care provider about the use of this medicine in children. Special care may be needed. Overdosage: If you think you have taken too much of this medicine contact a poison control center or emergency room at once. NOTE: This medicine is only for you. Do not share this medicine with others. What if I miss a dose? This does not apply. This medicine is not for regular use. What may interact with this medicine? Do not take this medicine with any of the following medications:  certain diuretics such as spironolactone, triamterene  eplerenone  sodium polystyrene sulfonate This medicine may also interact with the following medications:  certain medicines for blood pressure or heart disease like lisinopril, losartan, quinapril, valsartan  medicines that lower your chance of fighting infection such as cyclosporine, tacrolimus  NSAIDs,  medicines for pain and inflammation, like ibuprofen or naproxen  other potassium supplements  salt substitutes This list may not describe all possible interactions. Give your health care provider a list of all the medicines, herbs, non-prescription drugs, or dietary supplements you use. Also tell them if you smoke, drink alcohol, or use illegal drugs. Some items may interact with your medicine. What should I watch for while using this medicine? Visit your health care provider for regular checks on your progress. Tell your health care provider if your symptoms do not start to get better or if they get worse. You may need blood work while you are taking this medicine. Avoid salt substitutes unless you are told otherwise by your health care provider. What side effects may I notice from receiving this medicine? Side effects that you should report to your doctor or health care professional as soon as possible:  allergic reactions (skin rash, itching, hives, swelling of the face, lips, tongue, or throat)  confusion  high potassium levels (muscle weakness, fast or irregular heartbeat)  low blood pressure (dizziness, feeling faint or lightheaded, blurry vision)  pain, tingling, or numbness in lips, hands, or feet  pain, redness, or irritation at site where injected  trouble breathing Side effects that usually do not require medical attention (report to your doctor or health care professional if they continue or are bothersome):  diarrhea  nausea, vomiting  passing gas  stomach pain This list may not describe all possible side effects. Call your doctor for medical advice about side effects. You may report side effects to FDA at 1-800-FDA-1088. Where should I keep my  medicine? This medicine is given in a hospital or clinic. It will not be stored at home. NOTE: This sheet is a summary. It may not cover all possible information. If you have questions about this medicine, talk to your  doctor, pharmacist, or health care provider.  2021 Elsevier/Gold Standard (2019-08-20 18:18:09)

## 2021-04-05 NOTE — Progress Notes (Signed)
Patient here for follow up. Pt's wife reports that pt was in nursing home but she took him out. Pt reports feeling weaker and having no appetite.

## 2021-04-06 ENCOUNTER — Encounter: Payer: Self-pay | Admitting: Oncology

## 2021-04-06 ENCOUNTER — Other Ambulatory Visit: Payer: Medicare HMO | Admitting: Adult Health Nurse Practitioner

## 2021-04-06 ENCOUNTER — Encounter: Payer: Self-pay | Admitting: Adult Health Nurse Practitioner

## 2021-04-06 ENCOUNTER — Other Ambulatory Visit: Payer: Self-pay

## 2021-04-06 ENCOUNTER — Inpatient Hospital Stay: Payer: Medicare HMO

## 2021-04-06 VITALS — BP 98/50 | HR 82 | Wt 138.6 lb

## 2021-04-06 DIAGNOSIS — R5381 Other malaise: Secondary | ICD-10-CM | POA: Diagnosis not present

## 2021-04-06 DIAGNOSIS — C7802 Secondary malignant neoplasm of left lung: Secondary | ICD-10-CM | POA: Diagnosis not present

## 2021-04-06 DIAGNOSIS — Z515 Encounter for palliative care: Secondary | ICD-10-CM | POA: Diagnosis not present

## 2021-04-06 DIAGNOSIS — E43 Unspecified severe protein-calorie malnutrition: Secondary | ICD-10-CM

## 2021-04-06 DIAGNOSIS — C7931 Secondary malignant neoplasm of brain: Secondary | ICD-10-CM

## 2021-04-06 NOTE — Progress Notes (Signed)
[facility] Consult Note Telephone: [phone]  Fax: [phone]    Date of encounter: 04/06/21 PATIENT NAME: Travis Marshall [id number]   [phone] (home)  DOB: 11/03/1946 MRN: [id number] PRIMARY CARE PROVIDER:    Baxter Hire, MD,  [place] [address] [phone]  REFERRING PROVIDER:   Billey Chang NP  RESPONSIBLE PARTY:    Contact Information    Name Relation Home Work Mobile   Travis Marshall, Travis Marshall [phone]  [phone]       I met face to face with patient and family in home. Palliative Care was asked to follow this patient by consultation request of  Billey Chang NP to address advance care planning and complex medical decision making. This is the initial visit.   Wife present during visit today                                    ASSESSMENT AND PLAN / RECOMMENDATIONS:   Advance Care Planning/Goals of Care: Goals include to maximize quality of life and symptom management. Our advance care planning conversation included a discussion about:     The value and importance of advance care planning  Identification and preparation of a healthcare agent. Emailed wife copy of [place] HCPOA  Decision not to resuscitate or to de-escalate disease focused treatments due to poor prognosis.  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  He is unsure if he should continue or not.  Did have discussion about quality of life, continuing treatments versus hospice.  He does have an appointment with oncology on June 15.  Continue follow-up with oncology.  We will continue to have further discussions on pursuing treatment in the future.  PCM: Have encouraged smaller more frequent meals.  Have also encouraged to continue supplementation with  Ensure/boost and if not able to tolerate this can use clear versions of Ensure/boost  Debility: Patient and wife state that home health PT has been ordered through [facility] and they have not heard anything about the initial assessment yet.  Wife does state that she has called the number for [facility] that was given to her and left a message.  We will have RN navigator help pursue this order.  If qualifies patient could also benefit with aide services through home health as he is unable to perform his own ADLs.  Support: Patient now chair bound having concerns getting back and forth to doctors appointments.  Have put in social work referral for assistance with transportation resources   Follow up Palliative Care Visit: Palliative care will continue to follow for complex medical decision making, advance care planning, and clarification of goals. Return 4 weeks or prn. Encouraged to call with any questions or concerns  I spent 90 minutes providing this consultation. More than 50% of the time in this consultation was spent in counseling and care coordination.   PPS: 30%  HOSPICE ELIGIBILITY/DIAGNOSIS: TBD  Chief Complaint: initial palliative visit  HISTORY OF PRESENT ILLNESS:  Travis Marshall is a 75 y.o. 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  Patient has metastatic lung cancer and on 01/20/2021 MRI showed mets to the brain and 5  areas.  On 01/23/2021 PET scan showed RLL mass.  Patient has undergone full brain radiation.  He did go through 2 rounds of chemo.  Patient was hospitalized 5/16 through 03/28/2021 for pancytopenia secondary to the chemo.  He did require 2 units PRBCs.  His albumin is 2.5 and protein 4.9.  Patient has gotten weaker since undergoing treatment.  In April patient states that he was out mowing his lawn and able to perform his own ADLs.  Patient is now bed and chair bound.  Requires help with ADLs.  States he  does not have an appetite and has to force himself to eat.  Does supplement with Ensure/boost.  States feeling tired and sleepy all the time and sleeps about 80% of the day.  Patient's baseline weight was around 174 pounds with BMI of 28.64 up until April.  Patient has lost 36 pounds over the past 2 months and now weighs 138 pounds with BMI of 21.71.  Patient does state that he has numbness in his toes and that they get cold.  Patient is still able to feel touch over toes and top and bottom of feet.  Patient denies pain, headaches, dizziness, N/V/D, constipation, fever, palpitations.  History obtained from review of EMR and interview with family and Travis Marshall.  I reviewed available labs, medications, imaging, studies and related documents from the EMR.  Records reviewed and summarized above.    Physical Exam:  Constitutional: NAD General: frail appearing, thin EYES: anicteric sclera, lids intact, no discharge  ENMT: intact hearing, oral mucous membranes moist CV: S1S2, RRR, no LE edema Pulmonary: LCTA, no increased work of breathing, patient has cough;  O2 87-88% and went up to 95% after patient coughed Abdomen: intake 100%, normo-active BS + 4 quadrants, soft and non tender MSK: non ambulatory Skin: warm and dry, no rashes on visible skin Neuro:  A and O x 3 Hem/lymph/immuno: no widespread bruising   CURRENT PROBLEM LIST:  Patient Active Problem List   Diagnosis Date Noted  . Hyperglycemia 04/05/2021  . Protein-calorie malnutrition, severe 03/24/2021  . Adrenal insufficiency ([place])   . Pancytopenia ([place]) 03/22/2021  . Chemotherapy-induced neutropenia ([place])   . Moderate malnutrition ([place])   . Palliative care encounter   . Weakness   . Hypomagnesemia   . Hypokalemia   . Brain metastasis ([place])   . Failure to thrive in adult   . Acute hyponatremia 03/20/2021  . Goals of care, counseling/discussion 02/08/2021  . Neuroendocrine cancer ([place]) 02/01/2021  . Former smoker 01/10/2021   . Encounter for antineoplastic chemotherapy 01/10/2021  . Metastatic lung carcinoma, left ([place]) 01/09/2021   PAST MEDICAL HISTORY:  Active Ambulatory Problems    Diagnosis Date Noted  . Metastatic lung carcinoma, left ([place]) 01/09/2021  . Former smoker 01/10/2021  . Encounter for antineoplastic chemotherapy 01/10/2021  . Neuroendocrine cancer ([place]) 02/01/2021  . Goals of care, counseling/discussion 02/08/2021  . Acute hyponatremia 03/20/2021  . Palliative care encounter   . Weakness   . Hypomagnesemia   . Hypokalemia   . Brain metastasis ([place])   . Failure to thrive in adult   . Chemotherapy-induced neutropenia ([place])   . Moderate malnutrition ([place])   . Pancytopenia ([place]) 03/22/2021  . Protein-calorie malnutrition, severe 03/24/2021  . Adrenal insufficiency ([place])   . Hyperglycemia 04/05/2021   Resolved Ambulatory Problems    Diagnosis Date Noted  . No Resolved Ambulatory Problems   Past Medical History:  Diagnosis Date  . Arthritis   . CAD (coronary  artery disease)   . Cancer ([place])   . GERD (gastroesophageal reflux disease)   . Hypertension   . Myocardial infarction ([place]) 02/2005   SOCIAL HX:  Social History   Tobacco Use  . Smoking status: Former Smoker    Packs/day: 1.00    Years: 50.00    Pack years: 50.00    Types: Cigarettes    Quit date: 12/21/2004    Years since quitting: 16.3  . Smokeless tobacco: Never Used  Substance Use Topics  . Alcohol use: Yes    Comment: RARE   FAMILY HX:  Family History  Problem Relation Age of Onset  . Cancer Mother        unkown origin  . Lung cancer Sister       ALLERGIES:  Allergies  Allergen Reactions  . Ace Inhibitors Other (See Comments)  . Atorvastatin Other (See Comments)     PERTINENT MEDICATIONS:  Outpatient Encounter Medications as of 04/06/2021  Medication Sig  . aspirin EC 81 MG tablet Take 81 mg by mouth daily. Swallow whole.  . Cysteamine Bitartrate (PROCYSBI) 300 MG PACK See admin instructions.  .  finasteride (PROSCAR) 5 MG tablet Take 5 mg by mouth every morning. (Patient not taking: Reported on 04/05/2021)  . guaiFENesin (MUCINEX) 600 MG 12 hr tablet Take 1 tablet (600 mg total) by mouth 2 (two) times daily.  . hydrocortisone (CORTEF) 20 MG tablet Take 1 tablet (20 mg total) by mouth daily.  . hydrocortisone (CORTEF) 5 MG tablet Take 3 tablets (15 mg total) by mouth daily at 4 PM.  . insulin aspart (NOVOLOG) 100 UNIT/ML injection Inject 0-15 Units into the skin 3 (three) times daily with meals. CBG 70 - 120: 0 units  CBG 121 - 150: 2 units  CBG 151 - 200: 3 units  CBG 201 - 250: 5 units  CBG 251 - 300: 8 units  CBG 301 - 350: 11 units  CBG 351 - 400: 15 units  CBG > 400 call MD and obtain STAT lab verification (Patient not taking: Reported on 04/05/2021)  . insulin glargine (LANTUS) 100 UNIT/ML injection Inject 0.05 mLs (5 Units total) into the skin daily. (Patient not taking: Reported on 04/05/2021)  . levETIRAcetam (KEPPRA) 500 MG tablet Take 1 tablet (500 mg total) by mouth 2 (two) times daily.  Marland Kitchen lidocaine-prilocaine (EMLA) cream Apply 1 application topically as needed. (Patient not taking: Reported on 04/05/2021)  . metoprolol succinate (TOPROL-XL) 25 MG 24 hr tablet Take 25 mg by mouth every morning. (Patient not taking: Reported on 04/05/2021)  . mirtazapine (REMERON) 7.5 MG tablet Take 1 tablet (7.5 mg total) by mouth at bedtime.  . Multiple Vitamin (MULTIVITAMIN WITH MINERALS) TABS tablet Take 1 tablet by mouth daily. (Patient not taking: Reported on 04/05/2021)  . nystatin (MYCOSTATIN) 100000 UNIT/ML suspension Take 5 mLs (500,000 Units total) by mouth 4 (four) times daily. (Patient not taking: Reported on 04/05/2021)  . omeprazole (PRILOSEC) 20 MG capsule Take 20 mg by mouth every morning.  . polyethylene glycol (MIRALAX / GLYCOLAX) 17 g packet Take 17 g by mouth daily. (Patient not taking: Reported on 04/05/2021)  . potassium chloride SA (KLOR-CON) 20 MEQ tablet Take 2 tablets (40 mEq  total) by mouth 2 (two) times daily for 14 days.  . pravastatin (PRAVACHOL) 40 MG tablet Take 40 mg by mouth every morning.  . prochlorperazine (COMPAZINE) 10 MG tablet Take 1 tablet (10 mg total) by mouth every 6 (six) hours as needed (Nausea or  vomiting). (Patient not taking: Reported on 04/05/2021)   No facility-administered encounter medications on file as of 04/06/2021.    Thank you for the opportunity to participate in the care of Mr. Anger.  The palliative care team will continue to follow. Please call our office at [phone] if we can be of additional assistance.   Rozalynn Buege Jenetta Downer, NP , DNP  This chart was dictated using voice recognition software. Despite best efforts to proofread, errors can occur which can change the documentation meaning.   COVID-19 PATIENT SCREENING TOOL Asked and negative response unless otherwise noted:   Have you had symptoms of covid, tested positive or been in contact with someone with symptoms/positive test in the past 5-10 days? negative

## 2021-04-07 ENCOUNTER — Inpatient Hospital Stay: Payer: Medicare HMO

## 2021-04-10 ENCOUNTER — Telehealth: Payer: Self-pay | Admitting: Licensed Clinical Social Worker

## 2021-04-10 ENCOUNTER — Ambulatory Visit
Admission: RE | Admit: 2021-04-10 | Discharge: 2021-04-10 | Disposition: A | Discharge status: Home / Self Care | Payer: Medicare HMO | Source: Ambulatory Visit | Attending: Radiation Oncology | Admitting: Radiation Oncology

## 2021-04-10 ENCOUNTER — Encounter: Payer: Self-pay | Admitting: Radiation Oncology

## 2021-04-10 ENCOUNTER — Ambulatory Visit: Payer: Medicare HMO

## 2021-04-10 VITALS — BP 77/62 | HR 106 | Temp 96.2°F

## 2021-04-10 DIAGNOSIS — C7931 Secondary malignant neoplasm of brain: Secondary | ICD-10-CM | POA: Insufficient documentation

## 2021-04-10 DIAGNOSIS — R54 Age-related physical debility: Secondary | ICD-10-CM | POA: Diagnosis not present

## 2021-04-10 DIAGNOSIS — R739 Hyperglycemia, unspecified: Secondary | ICD-10-CM | POA: Diagnosis not present

## 2021-04-10 DIAGNOSIS — I959 Hypotension, unspecified: Secondary | ICD-10-CM | POA: Insufficient documentation

## 2021-04-10 DIAGNOSIS — Z87891 Personal history of nicotine dependence: Secondary | ICD-10-CM | POA: Insufficient documentation

## 2021-04-10 DIAGNOSIS — C3431 Malignant neoplasm of lower lobe, right bronchus or lung: Secondary | ICD-10-CM | POA: Diagnosis not present

## 2021-04-10 DIAGNOSIS — Z923 Personal history of irradiation: Secondary | ICD-10-CM | POA: Diagnosis not present

## 2021-04-10 NOTE — Progress Notes (Signed)
Radiation Oncology Follow up Note  Name: Travis Marshall   Date:   04/10/2021 MRN:  [id number] DOB: April 24, 1946    This 75 y.o. 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  REFERRING PROVIDER: Baxter Hire, MD  HPI: Patient is a 75 year old male now at 1 month having completed whole brain radiation therapy for metastatic disease secondary to high-grade neuroendocrine carcinoma.  He is seen today and is frail hypotensive.  He is currently on palliative chemotherapy with carboplatinum etoposide.  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  He also is hyperglycemic.  His major concern is not ability to ambulate although this predates his whole brain radiation.  He specifically denies any change in visual fields or any focal neurologic deficits.  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  Crude visual fields are within normal range motor or sensory and DTR levels appear equal and symmetric in upper lower extremities.  Patient is wheelchair-bound.  Well-developed well-nourished patient in NAD. HEENT reveals PERLA, EOMI, discs not visualized.  Oral cavity is clear. No oral mucosal lesions are identified. Neck is clear without evidence of cervical or supraclavicular adenopathy. Lungs are clear to A&P. Cardiac examination is essentially unremarkable with regular rate and rhythm without murmur rub or thrill. Abdomen is benign with no organomegaly or masses noted. Motor sensory and DTR levels are equal and symmetric in the upper and lower extremities. Cranial nerves II through XII are grossly intact. Proprioception is intact. No  peripheral adenopathy or edema is identified. No motor or sensory levels are noted. Crude visual fields are within normal range.  RADIOLOGY RESULTS: No current films for review  PLAN: Present time I will turn follow-up care over to medical oncology.  I be happy to reevaluate the patient anytime should further palliative radiation be indicated.  He will follow-up with his endocrinology appointment this week.  Family knows to call with any concerns.  I would like to take this opportunity to thank you for allowing me to participate in the care of your patient.Noreene Filbert, MD

## 2021-04-10 NOTE — Telephone Encounter (Signed)
Patient was here for a f/u visit for brain mets and he complained of fatigue, and weakness. Patients b/p was 77/62 p-106. PAtients wife states that it has been running low like that for her when she checks it at home. Symptom management was going to try and work him in to be seen but the patient's wife said that they would just come to his appointment on Wednesday. I told her to let us know if she changes her mind.

## 2021-04-11 ENCOUNTER — Telehealth: Payer: Self-pay

## 2021-04-11 NOTE — Telephone Encounter (Signed)
04/11/21 @250  PM: Palliative care SW outreached patients spouse, per NP - A. Olena Heckle request, to discuss transportation needs.  Spouse shared that she and patient are not in ned of transportation assistance at the moment, as she is still able to transport patient. SW offered to mail transportation resources to address on file for future reference. Spouse appreciative of resources and call. No other needs at this time.

## 2021-04-12 ENCOUNTER — Other Ambulatory Visit: Payer: Self-pay

## 2021-04-12 ENCOUNTER — Inpatient Hospital Stay: Payer: Medicare HMO

## 2021-04-12 ENCOUNTER — Inpatient Hospital Stay (HOSPITAL_BASED_OUTPATIENT_CLINIC_OR_DEPARTMENT_OTHER): Payer: Medicare HMO | Admitting: Oncology

## 2021-04-12 VITALS — BP 104/75 | HR 45 | Temp 96.7°F | Resp 18

## 2021-04-12 DIAGNOSIS — R197 Diarrhea, unspecified: Secondary | ICD-10-CM | POA: Diagnosis not present

## 2021-04-12 DIAGNOSIS — R590 Localized enlarged lymph nodes: Secondary | ICD-10-CM | POA: Diagnosis not present

## 2021-04-12 DIAGNOSIS — E876 Hypokalemia: Secondary | ICD-10-CM

## 2021-04-12 DIAGNOSIS — R634 Abnormal weight loss: Secondary | ICD-10-CM | POA: Diagnosis not present

## 2021-04-12 DIAGNOSIS — R0602 Shortness of breath: Secondary | ICD-10-CM | POA: Diagnosis not present

## 2021-04-12 DIAGNOSIS — R918 Other nonspecific abnormal finding of lung field: Secondary | ICD-10-CM | POA: Diagnosis not present

## 2021-04-12 DIAGNOSIS — C7B8 Other secondary neuroendocrine tumors: Secondary | ICD-10-CM | POA: Diagnosis not present

## 2021-04-12 DIAGNOSIS — E861 Hypovolemia: Secondary | ICD-10-CM | POA: Diagnosis not present

## 2021-04-12 DIAGNOSIS — R531 Weakness: Secondary | ICD-10-CM

## 2021-04-12 DIAGNOSIS — E860 Dehydration: Secondary | ICD-10-CM | POA: Diagnosis not present

## 2021-04-12 DIAGNOSIS — E2740 Unspecified adrenocortical insufficiency: Secondary | ICD-10-CM | POA: Diagnosis not present

## 2021-04-12 DIAGNOSIS — C7A1 Malignant poorly differentiated neuroendocrine tumors: Secondary | ICD-10-CM | POA: Diagnosis not present

## 2021-04-12 DIAGNOSIS — C7802 Secondary malignant neoplasm of left lung: Secondary | ICD-10-CM

## 2021-04-12 LAB — CBC WITH DIFFERENTIAL/PLATELET
Abs Immature Granulocytes: 0.25 10*3/uL — ABNORMAL HIGH (ref 0.00–0.07)
Basophils Absolute: 0.1 10*3/uL (ref 0.0–0.1)
Basophils Relative: 1 %
Eosinophils Absolute: 0 10*3/uL (ref 0.0–0.5)
Eosinophils Relative: 0 %
HCT: 30.3 % — ABNORMAL LOW (ref 39.0–52.0)
Hemoglobin: 10 g/dL — ABNORMAL LOW (ref 13.0–17.0)
Immature Granulocytes: 3 %
Lymphocytes Relative: 26 %
Lymphs Abs: 2.3 10*3/uL (ref 0.7–4.0)
MCH: 32.1 pg (ref 26.0–34.0)
MCHC: 33 g/dL (ref 30.0–36.0)
MCV: 97.1 fL (ref 80.0–100.0)
Monocytes Absolute: 0.7 10*3/uL (ref 0.1–1.0)
Monocytes Relative: 8 %
Neutro Abs: 5.2 10*3/uL (ref 1.7–7.7)
Neutrophils Relative %: 62 %
Platelets: 223 10*3/uL (ref 150–400)
RBC: 3.12 MIL/uL — ABNORMAL LOW (ref 4.22–5.81)
RDW: 23.8 % — ABNORMAL HIGH (ref 11.5–15.5)
WBC: 8.5 10*3/uL (ref 4.0–10.5)
nRBC: 0.6 % — ABNORMAL HIGH (ref 0.0–0.2)

## 2021-04-12 LAB — COMPREHENSIVE METABOLIC PANEL
ALT: 18 U/L (ref 0–44)
AST: 19 U/L (ref 15–41)
Albumin: 3 g/dL — ABNORMAL LOW (ref 3.5–5.0)
Alkaline Phosphatase: 44 U/L (ref 38–126)
Anion gap: 10 (ref 5–15)
BUN: 8 mg/dL (ref 8–23)
CO2: 25 mmol/L (ref 22–32)
Calcium: 8 mg/dL — ABNORMAL LOW (ref 8.9–10.3)
Chloride: 104 mmol/L (ref 98–111)
Creatinine, Ser: 0.4 mg/dL — ABNORMAL LOW (ref 0.61–1.24)
GFR, Estimated: >60 mL/min (ref >60)
Glucose, Bld: 127 mg/dL — ABNORMAL HIGH (ref 70–99)
Potassium: 2.7 mmol/L — CL (ref 3.5–5.1)
Sodium: 139 mmol/L (ref 135–145)
Total Bilirubin: 1 mg/dL (ref 0.3–1.2)
Total Protein: 5.6 g/dL — ABNORMAL LOW (ref 6.5–8.1)

## 2021-04-12 LAB — MAGNESIUM: Magnesium: 1.3 mg/dL — ABNORMAL LOW (ref 1.7–2.4)

## 2021-04-12 MED ORDER — HEPARIN SOD (PORK) LOCK FLUSH 100 UNIT/ML IV SOLN
500.0000 [IU] | Freq: Once | INTRAVENOUS | Status: AC
  Administered 2021-04-12: 500 [IU] via INTRAVENOUS
  Filled 2021-04-12: qty 5

## 2021-04-12 MED ORDER — SODIUM CHLORIDE 0.9% FLUSH
10.0000 mL | INTRAVENOUS | Status: DC | PRN
  Administered 2021-04-12: 10 mL via INTRAVENOUS
  Filled 2021-04-12: qty 10

## 2021-04-12 MED ORDER — MAGNESIUM SULFATE 2 GM/50ML IV SOLN
2.0000 g | Freq: Once | INTRAVENOUS | Status: AC
  Administered 2021-04-12: 2 g via INTRAVENOUS
  Filled 2021-04-12: qty 50

## 2021-04-12 MED ORDER — SODIUM CHLORIDE 0.9 % IV SOLN
Freq: Once | INTRAVENOUS | Status: AC
  Filled 2021-04-12: qty 250

## 2021-04-12 MED ORDER — POTASSIUM CHLORIDE IN NACL 40-0.9 MEQ/L-% IV SOLN
Freq: Once | INTRAVENOUS | Status: AC
Start: 2021-04-12 — End: 2021-04-12
  Filled 2021-04-12: qty 1000

## 2021-04-12 NOTE — Progress Notes (Signed)
Getting weaker day by day. Complains of his "butt" hurting from the car ride over here. Wife states pt almost fell this morning at home, it is getting harder for him to use the walker. He is eating very small amounts of food. Doesn't want to drink water much. Bowels are moving no constipation lately having 2 loose stools on average per day. Taking medications crushed up.

## 2021-04-12 NOTE — Progress Notes (Signed)
Pt receiving potassium and magnesium via his [place] this morning. Potassium level is 2.7 and magnesium level is 1.3 Complains of progressing weakness. BP improved at time of discharge. No complaints of pain. Pt was given stool specimen cup and supplies to try and obtain stool specimen at home as he was not able to provide one in clinic today. Instructions for stool specimen given to wife.

## 2021-04-12 NOTE — Patient Instructions (Signed)
[facility]  Discharge Instructions: Thank you for choosing [facility] to provide your oncology and hematology care.  If you have a lab appointment with the [facility], please go directly to the [facility] and check in at the registration area.  Wear comfortable clothing and clothing appropriate for easy access to any Portacath or PICC line.   We strive to give you quality time with your provider. You may need to reschedule your appointment if you arrive late (15 or more minutes).  Arriving late affects you and other patients whose appointments are after yours.  Also, if you miss three or more appointments without notifying the office, you may be dismissed from the clinic at the provider's discretion.      For prescription refill requests, have your pharmacy contact our office and allow 72 hours for refills to be completed.    Today you received the following chemotherapy and/or immunotherapy agents       To help prevent nausea and vomiting after your treatment, we encourage you to take your nausea medication as directed.  BELOW ARE SYMPTOMS THAT SHOULD BE REPORTED IMMEDIATELY: . *FEVER GREATER THAN 100.4 F (38 C) OR HIGHER . *CHILLS OR SWEATING . *NAUSEA AND VOMITING THAT IS NOT CONTROLLED WITH YOUR NAUSEA MEDICATION . *UNUSUAL SHORTNESS OF BREATH . *UNUSUAL BRUISING OR BLEEDING . *URINARY PROBLEMS (pain or burning when urinating, or frequent urination) . *BOWEL PROBLEMS (unusual diarrhea, constipation, pain near the anus) . TENDERNESS IN MOUTH AND THROAT WITH OR WITHOUT PRESENCE OF ULCERS (sore throat, sores in mouth, or a toothache) . UNUSUAL RASH, SWELLING OR PAIN  . UNUSUAL VAGINAL DISCHARGE OR ITCHING   Items with * indicate a potential emergency and should be followed up as soon as possible or go to the Emergency Department if any problems should occur.  Please show the CHEMOTHERAPY ALERT CARD or IMMUNOTHERAPY ALERT CARD at  check-in to the Emergency Department and triage nurse.  Should you have questions after your visit or need to cancel or reschedule your appointment, please contact [facility]  [phone] and follow the prompts.  Office hours are 8:00 a.m. to 4:30 p.m. Monday - Friday. Please note that voicemails left after 4:00 p.m. may not be returned until the following business day.  We are closed weekends and major holidays. You have access to a nurse at all times for urgent questions. Please call the main number to the clinic [phone] and follow the prompts.  For any non-urgent questions, you may also contact your provider using MyChart. We now offer e-Visits for anyone 59 and older to request care online for non-urgent symptoms. For details visit [email or web address].   Also download the MyChart app! Go to the app store, search "MyChart", open the app, select [facility], and log in with your MyChart username and password.  Due to Covid, a mask is required upon entering the hospital/clinic. If you do not have a mask, one will be given to you upon arrival. For doctor visits, patients may have 1 support person aged 73 or older with them. For treatment visits, patients cannot have anyone with them due to current Covid guidelines and our immunocompromised population. Potassium chloride injection What is this medicine? POTASSIUM CHLORIDE (poe TASS i um KLOOR ide) is a potassium supplement used to prevent and to treat low potassium. Potassium is important for the heart, muscles, and nerves. Too much or too little potassium in the body can cause  serious problems. This medicine may be used for other purposes; ask your health care provider or pharmacist if you have questions. COMMON BRAND NAME(S): PROAMP What should I tell my health care provider before I take this medicine? They need to know if you have any of these conditions:  Addison disease  dehydration  diabetes  (high blood sugar)  heart disease  high levels of potassium in the blood  irregular heartbeat or rhythm  kidney disease  large areas of burned skin  an unusual or allergic reaction to potassium, other medicines, foods, dyes, or preservatives  pregnant or trying to get pregnant  breast-feeding How should I use this medicine? This medicine is injected into a vein. It is given by a health care provider in a hospital or clinic setting. Talk to your health care provider about the use of this medicine in children. Special care may be needed. Overdosage: If you think you have taken too much of this medicine contact a poison control center or emergency room at once. NOTE: This medicine is only for you. Do not share this medicine with others. What if I miss a dose? This does not apply. This medicine is not for regular use. What may interact with this medicine? Do not take this medicine with any of the following medications:  certain diuretics such as spironolactone, triamterene  eplerenone  sodium polystyrene sulfonate This medicine may also interact with the following medications:  certain medicines for blood pressure or heart disease like lisinopril, losartan, quinapril, valsartan  medicines that lower your chance of fighting infection such as cyclosporine, tacrolimus  NSAIDs, medicines for pain and inflammation, like ibuprofen or naproxen  other potassium supplements  salt substitutes This list may not describe all possible interactions. Give your health care provider a list of all the medicines, herbs, non-prescription drugs, or dietary supplements you use. Also tell them if you smoke, drink alcohol, or use illegal drugs. Some items may interact with your medicine. What should I watch for while using this medicine? Visit your health care provider for regular checks on your progress. Tell your health care provider if your symptoms do not start to get better or if they get  worse. You may need blood work while you are taking this medicine. Avoid salt substitutes unless you are told otherwise by your health care provider. What side effects may I notice from receiving this medicine? Side effects that you should report to your doctor or health care professional as soon as possible:  allergic reactions (skin rash, itching, hives, swelling of the face, lips, tongue, or throat)  confusion  high potassium levels (muscle weakness, fast or irregular heartbeat)  low blood pressure (dizziness, feeling faint or lightheaded, blurry vision)  pain, tingling, or numbness in lips, hands, or feet  pain, redness, or irritation at site where injected  trouble breathing Side effects that usually do not require medical attention (report to your doctor or health care professional if they continue or are bothersome):  diarrhea  nausea, vomiting  passing gas  stomach pain This list may not describe all possible side effects. Call your doctor for medical advice about side effects. You may report side effects to FDA at 1-800-FDA-1088. Where should I keep my medicine? This medicine is given in a hospital or clinic. It will not be stored at home. NOTE: This sheet is a summary. It may not cover all possible information. If you have questions about this medicine, talk to your doctor, pharmacist, or health care provider.  2021 Elsevier/Gold Standard (2019-08-20 18:18:09) Magnesium Sulfate injection What is this medicine? MAGNESIUM SULFATE (mag NEE zee um SUL fate) is an electrolyte injection commonly used to treat low magnesium levels in your blood. It is also used to prevent or control seizures in women with preeclampsia or eclampsia. This medicine may be used for other purposes; ask your health care provider or pharmacist if you have questions. What should I tell my health care provider before I take this medicine? They need to know if you have any of these  conditions:  heart disease  history of irregular heart beat  kidney disease  an unusual or allergic reaction to magnesium sulfate, medicines, foods, dyes, or preservatives  pregnant or trying to get pregnant  breast-feeding How should I use this medicine? This medicine is for infusion into a vein. It is given by a health care professional in a hospital or clinic setting. Talk to your pediatrician regarding the use of this medicine in children. While this drug may be prescribed for selected conditions, precautions do apply. Overdosage: If you think you have taken too much of this medicine contact a poison control center or emergency room at once. NOTE: This medicine is only for you. Do not share this medicine with others. What if I miss a dose? This does not apply. What may interact with this medicine? This medicine may interact with the following medications:  certain medicines for anxiety or sleep  certain medicines for seizures like phenobarbital  digoxin  medicines that relax muscles for surgery  narcotic medicines for pain This list may not describe all possible interactions. Give your health care provider a list of all the medicines, herbs, non-prescription drugs, or dietary supplements you use. Also tell them if you smoke, drink alcohol, or use illegal drugs. Some items may interact with your medicine. What should I watch for while using this medicine? Your condition will be monitored carefully while you are receiving this medicine. You may need blood work done while you are receiving this medicine. What side effects may I notice from receiving this medicine? Side effects that you should report to your doctor or health care professional as soon as possible:  allergic reactions like skin rash, itching or hives, swelling of the face, lips, or tongue  facial flushing  muscle weakness  signs and symptoms of low blood pressure like dizziness; feeling faint or lightheaded,  falls; unusually weak or tired  signs and symptoms of a dangerous change in heartbeat or heart rhythm like chest pain; dizziness; fast or irregular heartbeat; palpitations; breathing problems  sweating This list may not describe all possible side effects. Call your doctor for medical advice about side effects. You may report side effects to FDA at 1-800-FDA-1088. Where should I keep my medicine? This drug is given in a hospital or clinic and will not be stored at home. NOTE: This sheet is a summary. It may not cover all possible information. If you have questions about this medicine, talk to your doctor, pharmacist, or health care provider.  2021 Elsevier/Gold Standard (2016-05-09 12:31:42)
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
## 2021-04-19 ENCOUNTER — Other Ambulatory Visit: Payer: Self-pay

## 2021-04-19 ENCOUNTER — Telehealth: Payer: Self-pay

## 2021-04-19 ENCOUNTER — Inpatient Hospital Stay: Payer: Medicare HMO

## 2021-04-19 ENCOUNTER — Inpatient Hospital Stay (HOSPITAL_BASED_OUTPATIENT_CLINIC_OR_DEPARTMENT_OTHER): Payer: Medicare HMO | Admitting: Oncology

## 2021-04-19 ENCOUNTER — Encounter: Payer: Self-pay | Admitting: Oncology

## 2021-04-19 VITALS — BP 90/68 | HR 94 | Temp 96.8°F | Ht 67.0 in | Wt 133.4 lb

## 2021-04-19 DIAGNOSIS — C7802 Secondary malignant neoplasm of left lung: Secondary | ICD-10-CM | POA: Diagnosis not present

## 2021-04-19 DIAGNOSIS — I9589 Other hypotension: Secondary | ICD-10-CM

## 2021-04-19 DIAGNOSIS — C7A1 Malignant poorly differentiated neuroendocrine tumors: Secondary | ICD-10-CM

## 2021-04-19 DIAGNOSIS — E2740 Unspecified adrenocortical insufficiency: Secondary | ICD-10-CM

## 2021-04-19 DIAGNOSIS — R0602 Shortness of breath: Secondary | ICD-10-CM | POA: Diagnosis not present

## 2021-04-19 DIAGNOSIS — R918 Other nonspecific abnormal finding of lung field: Secondary | ICD-10-CM | POA: Diagnosis not present

## 2021-04-19 DIAGNOSIS — C7B8 Other secondary neuroendocrine tumors: Secondary | ICD-10-CM | POA: Diagnosis not present

## 2021-04-19 DIAGNOSIS — E861 Hypovolemia: Secondary | ICD-10-CM | POA: Diagnosis not present

## 2021-04-19 DIAGNOSIS — E876 Hypokalemia: Secondary | ICD-10-CM

## 2021-04-19 DIAGNOSIS — R590 Localized enlarged lymph nodes: Secondary | ICD-10-CM | POA: Diagnosis not present

## 2021-04-19 DIAGNOSIS — R5383 Other fatigue: Secondary | ICD-10-CM | POA: Diagnosis not present

## 2021-04-19 DIAGNOSIS — R634 Abnormal weight loss: Secondary | ICD-10-CM | POA: Diagnosis not present

## 2021-04-19 LAB — COMPREHENSIVE METABOLIC PANEL
ALT: 19 U/L (ref 0–44)
AST: 22 U/L (ref 15–41)
Albumin: 3.3 g/dL — ABNORMAL LOW (ref 3.5–5.0)
Alkaline Phosphatase: 37 U/L — ABNORMAL LOW (ref 38–126)
Anion gap: 9 (ref 5–15)
BUN: 10 mg/dL (ref 8–23)
CO2: 27 mmol/L (ref 22–32)
Calcium: 8.5 mg/dL — ABNORMAL LOW (ref 8.9–10.3)
Chloride: 105 mmol/L (ref 98–111)
Creatinine, Ser: 0.42 mg/dL — ABNORMAL LOW (ref 0.61–1.24)
GFR, Estimated: >60 mL/min (ref >60)
Glucose, Bld: 126 mg/dL — ABNORMAL HIGH (ref 70–99)
Potassium: 3.2 mmol/L — ABNORMAL LOW (ref 3.5–5.1)
Sodium: 141 mmol/L (ref 135–145)
Total Bilirubin: 1 mg/dL (ref 0.3–1.2)
Total Protein: 5.7 g/dL — ABNORMAL LOW (ref 6.5–8.1)

## 2021-04-19 LAB — CBC WITH DIFFERENTIAL/PLATELET
Abs Immature Granulocytes: 0.07 10*3/uL (ref 0.00–0.07)
Basophils Absolute: 0.1 10*3/uL (ref 0.0–0.1)
Basophils Relative: 1 %
Eosinophils Absolute: 0.2 10*3/uL (ref 0.0–0.5)
Eosinophils Relative: 2 %
HCT: 34.4 % — ABNORMAL LOW (ref 39.0–52.0)
Hemoglobin: 11.3 g/dL — ABNORMAL LOW (ref 13.0–17.0)
Immature Granulocytes: 1 %
Lymphocytes Relative: 30 %
Lymphs Abs: 2.8 10*3/uL (ref 0.7–4.0)
MCH: 32.8 pg (ref 26.0–34.0)
MCHC: 32.8 g/dL (ref 30.0–36.0)
MCV: 100 fL (ref 80.0–100.0)
Monocytes Absolute: 0.7 10*3/uL (ref 0.1–1.0)
Monocytes Relative: 7 %
Neutro Abs: 5.6 10*3/uL (ref 1.7–7.7)
Neutrophils Relative %: 59 %
Platelets: 249 10*3/uL (ref 150–400)
RBC: 3.44 MIL/uL — ABNORMAL LOW (ref 4.22–5.81)
RDW: 21.7 % — ABNORMAL HIGH (ref 11.5–15.5)
WBC: 9.4 10*3/uL (ref 4.0–10.5)
nRBC: 0 % (ref 0.0–0.2)

## 2021-04-19 LAB — TSH: TSH: 1.19 u[IU]/mL (ref 0.350–4.500)

## 2021-04-19 LAB — MAGNESIUM: Magnesium: 1.7 mg/dL (ref 1.7–2.4)

## 2021-04-19 MED ORDER — POTASSIUM CHLORIDE CRYS ER 20 MEQ PO TBCR: 40.0000 meq | EXTENDED_RELEASE_TABLET | Freq: Two times a day (BID) | ORAL | 0 refills | Status: DC

## 2021-04-19 MED ORDER — SODIUM CHLORIDE 0.9% FLUSH
10.0000 mL | Freq: Once | INTRAVENOUS | Status: AC
Start: 2021-04-19 — End: 2021-04-19
  Administered 2021-04-19: 10 mL via INTRAVENOUS
  Filled 2021-04-19: qty 10

## 2021-04-19 MED ORDER — SODIUM CHLORIDE 0.9 % IV SOLN
Freq: Once | INTRAVENOUS | Status: AC
  Filled 2021-04-19: qty 250

## 2021-04-19 MED ORDER — HEPARIN SOD (PORK) LOCK FLUSH 100 UNIT/ML IV SOLN
500.0000 [IU] | Freq: Once | INTRAVENOUS | Status: AC
  Administered 2021-04-19: 500 [IU] via INTRAVENOUS
  Filled 2021-04-19: qty 5

## 2021-04-19 MED ORDER — HEPARIN SOD (PORK) LOCK FLUSH 100 UNIT/ML IV SOLN
INTRAVENOUS | Status: AC
  Filled 2021-04-19: qty 5

## 2021-04-19 NOTE — Telephone Encounter (Signed)
Patient schedued to see Dr. Gabriel Carina in August. Per Dr. Tasia Catchings , she discussed patient with Dr. Gabriel Carina and she agreed to see him 2 weeks after discharge. Called Dr. Joycie Peek office to set up earlier appt. Spoke to office assistant who states that Dr. Gabriel Carina is out until 6/27, but she will send a message to Dr. Gabriel Carina and her nurse so he can get scheduled sooner and  they will contact him with an appt.
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
## 2021-04-19 NOTE — Patient Instructions (Signed)

## 2021-04-19 NOTE — Progress Notes (Signed)
Pt to receive IVFs only, no potassium per Benjamine Mola RN per Dr. Tasia Catchings.   No Magnesium needed at this time per Dr. Tasia Catchings.

## 2021-04-20 LAB — T4: T4, Total: 6.3 ug/dL (ref 4.5–12.0)

## 2021-04-26 ENCOUNTER — Inpatient Hospital Stay: Payer: Medicare HMO

## 2021-04-26 ENCOUNTER — Inpatient Hospital Stay (HOSPITAL_BASED_OUTPATIENT_CLINIC_OR_DEPARTMENT_OTHER): Payer: Medicare HMO | Admitting: Oncology

## 2021-04-26 ENCOUNTER — Other Ambulatory Visit: Payer: Self-pay

## 2021-04-26 ENCOUNTER — Encounter: Payer: Self-pay | Admitting: Oncology

## 2021-04-26 VITALS — BP 90/62 | HR 96 | Temp 96.8°F | Resp 16 | Wt 143.6 lb

## 2021-04-26 DIAGNOSIS — C7802 Secondary malignant neoplasm of left lung: Secondary | ICD-10-CM

## 2021-04-26 DIAGNOSIS — C7B8 Other secondary neuroendocrine tumors: Secondary | ICD-10-CM | POA: Diagnosis not present

## 2021-04-26 DIAGNOSIS — R590 Localized enlarged lymph nodes: Secondary | ICD-10-CM | POA: Diagnosis not present

## 2021-04-26 DIAGNOSIS — E861 Hypovolemia: Secondary | ICD-10-CM | POA: Diagnosis not present

## 2021-04-26 DIAGNOSIS — C7A1 Malignant poorly differentiated neuroendocrine tumors: Secondary | ICD-10-CM

## 2021-04-26 DIAGNOSIS — E2740 Unspecified adrenocortical insufficiency: Secondary | ICD-10-CM | POA: Diagnosis not present

## 2021-04-26 DIAGNOSIS — E876 Hypokalemia: Secondary | ICD-10-CM | POA: Diagnosis not present

## 2021-04-26 DIAGNOSIS — R918 Other nonspecific abnormal finding of lung field: Secondary | ICD-10-CM | POA: Diagnosis not present

## 2021-04-26 DIAGNOSIS — R0602 Shortness of breath: Secondary | ICD-10-CM | POA: Diagnosis not present

## 2021-04-26 DIAGNOSIS — R634 Abnormal weight loss: Secondary | ICD-10-CM | POA: Diagnosis not present

## 2021-04-26 LAB — CBC WITH DIFFERENTIAL/PLATELET
Abs Immature Granulocytes: 0.04 10*3/uL (ref 0.00–0.07)
Basophils Absolute: 0.1 10*3/uL (ref 0.0–0.1)
Basophils Relative: 1 %
Eosinophils Absolute: 0.1 10*3/uL (ref 0.0–0.5)
Eosinophils Relative: 2 %
HCT: 34 % — ABNORMAL LOW (ref 39.0–52.0)
Hemoglobin: 11.2 g/dL — ABNORMAL LOW (ref 13.0–17.0)
Immature Granulocytes: 1 %
Lymphocytes Relative: 29 %
Lymphs Abs: 2.4 10*3/uL (ref 0.7–4.0)
MCH: 33.1 pg (ref 26.0–34.0)
MCHC: 32.9 g/dL (ref 30.0–36.0)
MCV: 100.6 fL — ABNORMAL HIGH (ref 80.0–100.0)
Monocytes Absolute: 0.5 10*3/uL (ref 0.1–1.0)
Monocytes Relative: 7 %
Neutro Abs: 5 10*3/uL (ref 1.7–7.7)
Neutrophils Relative %: 60 %
Platelets: 158 10*3/uL (ref 150–400)
RBC: 3.38 MIL/uL — ABNORMAL LOW (ref 4.22–5.81)
RDW: 18.3 % — ABNORMAL HIGH (ref 11.5–15.5)
WBC: 8.1 10*3/uL (ref 4.0–10.5)
nRBC: 0 % (ref 0.0–0.2)

## 2021-04-26 LAB — COMPREHENSIVE METABOLIC PANEL
ALT: 12 U/L (ref 0–44)
AST: 24 U/L (ref 15–41)
Albumin: 3.4 g/dL — ABNORMAL LOW (ref 3.5–5.0)
Alkaline Phosphatase: 34 U/L — ABNORMAL LOW (ref 38–126)
Anion gap: 10 (ref 5–15)
BUN: 13 mg/dL (ref 8–23)
CO2: 25 mmol/L (ref 22–32)
Calcium: 8.4 mg/dL — ABNORMAL LOW (ref 8.9–10.3)
Chloride: 106 mmol/L (ref 98–111)
Creatinine, Ser: 0.42 mg/dL — ABNORMAL LOW (ref 0.61–1.24)
GFR, Estimated: >60 mL/min (ref >60)
Glucose, Bld: 145 mg/dL — ABNORMAL HIGH (ref 70–99)
Potassium: 3.3 mmol/L — ABNORMAL LOW (ref 3.5–5.1)
Sodium: 141 mmol/L (ref 135–145)
Total Bilirubin: 1 mg/dL (ref 0.3–1.2)
Total Protein: 5.6 g/dL — ABNORMAL LOW (ref 6.5–8.1)

## 2021-04-26 LAB — TSH: TSH: 0.918 u[IU]/mL (ref 0.350–4.500)

## 2021-04-26 MED ORDER — HEPARIN SOD (PORK) LOCK FLUSH 100 UNIT/ML IV SOLN
INTRAVENOUS | Status: AC
  Filled 2021-04-26: qty 5

## 2021-04-26 MED ORDER — SODIUM CHLORIDE 0.9% FLUSH
10.0000 mL | INTRAVENOUS | Status: DC | PRN
  Administered 2021-04-26: 10 mL via INTRAVENOUS
  Filled 2021-04-26: qty 10

## 2021-04-26 MED ORDER — POTASSIUM CHLORIDE 20 MEQ/100ML IV SOLN
20.0000 meq | Freq: Once | INTRAVENOUS | Status: AC
Start: 2021-04-26 — End: 2021-04-26
  Administered 2021-04-26: 20 meq via INTRAVENOUS

## 2021-04-26 MED ORDER — POTASSIUM CHLORIDE CRYS ER 20 MEQ PO TBCR: 40.0000 meq | EXTENDED_RELEASE_TABLET | Freq: Two times a day (BID) | ORAL | 0 refills | Status: DC

## 2021-04-26 MED ORDER — SODIUM CHLORIDE 0.9 % IV SOLN
Freq: Once | INTRAVENOUS | Status: AC
  Filled 2021-04-26: qty 250

## 2021-04-26 MED ORDER — HEPARIN SOD (PORK) LOCK FLUSH 100 UNIT/ML IV SOLN
500.0000 [IU] | Freq: Once | INTRAVENOUS | Status: AC
  Administered 2021-04-26: 500 [IU] via INTRAVENOUS
  Filled 2021-04-26: qty 5

## 2021-04-26 NOTE — Patient Instructions (Signed)
[facility] ONCOLOGY   Discharge Instructions: Thank you for choosing [facility] to provide your oncology and hematology care.  If you have a lab appointment with the [facility], please go directly to the [facility] and check in at the registration area.  Wear comfortable clothing and clothing appropriate for easy access to any Portacath or PICC line.   We strive to give you quality time with your provider. You may need to reschedule your appointment if you arrive late (15 or more minutes).  Arriving late affects you and other patients whose appointments are after yours.  Also, if you miss three or more appointments without notifying the office, you may be dismissed from the clinic at the provider's discretion.      For prescription refill requests, have your pharmacy contact our office and allow 72 hours for refills to be completed.    Today you received IV potassium    To help prevent nausea and vomiting after your treatment, we encourage you to take your nausea medication as directed.  BELOW ARE SYMPTOMS THAT SHOULD BE REPORTED IMMEDIATELY: *FEVER GREATER THAN 100.4 F (38 C) OR HIGHER *CHILLS OR SWEATING *NAUSEA AND VOMITING THAT IS NOT CONTROLLED WITH YOUR NAUSEA MEDICATION *UNUSUAL SHORTNESS OF BREATH *UNUSUAL BRUISING OR BLEEDING *URINARY PROBLEMS (pain or burning when urinating, or frequent urination) *BOWEL PROBLEMS (unusual diarrhea, constipation, pain near the anus) TENDERNESS IN MOUTH AND THROAT WITH OR WITHOUT PRESENCE OF ULCERS (sore throat, sores in mouth, or a toothache) UNUSUAL RASH, SWELLING OR PAIN  UNUSUAL VAGINAL DISCHARGE OR ITCHING   Items with * indicate a potential emergency and should be followed up as soon as possible or go to the Emergency Department if any problems should occur.  Please show the CHEMOTHERAPY ALERT CARD or IMMUNOTHERAPY ALERT CARD at check-in to the Emergency Department and triage  nurse.  Should you have questions after your visit or need to cancel or reschedule your appointment, please contact [facility]  [phone] and follow the prompts.  Office hours are 8:00 a.m. to 4:30 p.m. Monday - Friday. Please note that voicemails left after 4:00 p.m. may not be returned until the following business day.  We are closed weekends and major holidays. You have access to a nurse at all times for urgent questions. Please call the main number to the clinic [phone] and follow the prompts.  For any non-urgent questions, you may also contact your provider using MyChart. We now offer e-Visits for anyone 68 and older to request care online for non-urgent symptoms. For details visit [email or web address].   Also download the MyChart app! Go to the app store, search "MyChart", open the app, select [facility], and log in with your MyChart username and password.  Due to Covid, a mask is required upon entering the hospital/clinic. If you do not have a mask, one will be given to you upon arrival. For doctor visits, patients may have 1 support person aged 53 or older with them. For treatment visits, patients cannot have anyone with them due to current Covid guidelines and our immunocompromised population.

## 2021-04-26 NOTE — Progress Notes (Signed)
Nutrition Follow-up:   Patient with metastatic lung high grade neuroendocrine carcinoma.  Patient completed brain radiation on 4/22.  Patient on palliative chemotherapy.  Hospital admission 5/16-5/24.    Met with patient during infusion.  Patient hard of hearing.  Reports that "my wife says my appetite is better."  "They say I am doing better. I just want to sleep and eat."  Says that he is eating soup mostly.    Says that his wife and wife's sister are helping take care of him.      Medications: reviewed  Labs: reviewed  Anthropometrics:   Weight 143 lb 9.6 oz today increased  137 lb on 5/11 139 lb 12.8 oz on 5/4 UBW of 149 lb on 4/20   NUTRITION DIAGNOSIS: Inadequate oral intake improving    INTERVENTION:  Encouraged patient to focus on good nutrition to help him improve Encouraged patient to perform exercises that OT prescribed to improve strength.    MONITORING, EVALUATION, GOAL: weight trends, intake   NEXT VISIT: to be determined with treatment  Izzabelle Bouley B. Zenia Resides, [place], [place] Registered Dietitian [phone] (mobile)
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
## 2021-04-26 NOTE — Progress Notes (Signed)
Patient reports he is tired and sleeps all the time.

## 2021-04-27 LAB — T4: T4, Total: 6.5 ug/dL (ref 4.5–12.0)

## 2021-05-04 ENCOUNTER — Telehealth: Payer: Self-pay | Admitting: Adult Health Nurse Practitioner

## 2021-05-04 NOTE — Telephone Encounter (Signed)
Spoke with wife and confirmed appointment for tomorrow Santos Hardwick K. Olena Heckle NP

## 2021-05-05 ENCOUNTER — Other Ambulatory Visit: Payer: Self-pay | Admitting: Hospice and Palliative Medicine

## 2021-05-05 ENCOUNTER — Other Ambulatory Visit: Payer: Self-pay

## 2021-05-05 ENCOUNTER — Other Ambulatory Visit: Payer: Medicare HMO | Admitting: Adult Health Nurse Practitioner

## 2021-05-05 ENCOUNTER — Encounter: Payer: Self-pay | Admitting: Adult Health Nurse Practitioner

## 2021-05-05 VITALS — BP 102/50 | HR 70

## 2021-05-05 DIAGNOSIS — Z515 Encounter for palliative care: Secondary | ICD-10-CM | POA: Diagnosis not present

## 2021-05-05 DIAGNOSIS — E43 Unspecified severe protein-calorie malnutrition: Secondary | ICD-10-CM | POA: Diagnosis not present

## 2021-05-05 DIAGNOSIS — R531 Weakness: Secondary | ICD-10-CM

## 2021-05-05 DIAGNOSIS — C7931 Secondary malignant neoplasm of brain: Secondary | ICD-10-CM

## 2021-05-05 DIAGNOSIS — K5900 Constipation, unspecified: Secondary | ICD-10-CM

## 2021-05-05 DIAGNOSIS — C7802 Secondary malignant neoplasm of left lung: Secondary | ICD-10-CM | POA: Diagnosis not present

## 2021-05-05 DIAGNOSIS — R5381 Other malaise: Secondary | ICD-10-CM

## 2021-05-05 NOTE — Progress Notes (Signed)
[facility] Palliative [facility] Consult Note Telephone: [phone]  Fax: [phone]    Date of encounter: 05/05/21 PATIENT NAME: Travis Marshall [id number]   [phone] (home)  DOB: 1946-06-03 MRN: [id number] PRIMARY CARE PROVIDER:    Baxter Hire, MD,  [place] [address] [phone]  REFERRING PROVIDER:   Billey Chang NP  RESPONSIBLE PARTY:    Contact Information     Name Relation Home Work Mobile   Travis, Marshall [phone]  [phone]        I met face to face with patient and family in home. Palliative Care was asked to follow this patient by consultation request of  Billey Chang NP to address advance care planning and complex medical decision making. This is a follow up visit.  Wife present during visit today                                   ASSESSMENT AND PLAN / RECOMMENDATIONS:   Advance Care Planning/Goals of Care: Goals include to maximize quality of life and symptom management.  CODE STATUS: DNR  Symptom Management/Plan:  Metastatic lung cancer with mets to brain: Patient unsure what neck steps are.  Wife does state having an appointment with oncology next week.  Patient is wanting to pursue treatment if it will be helpful.  Continue follow-up and recommendations by oncology  Debility: Patient was starting to regain his strength and walking more.  Had fall yesterday afternoon and has been a bit apprehensive to get back up again.  States that now that he is eating more is having to go to the bathroom more and is apprehensive of falls and is still weak.  Have reached out to [employer] at oncology and he is going to check on home health PT and put an order for bedside commode.  PCM: Patient's appetite is improving and he has gone from 138 pounds to 143 pounds over the past month.  Continue to monitor weight and supplement with ensures if  needed.  Constipation: Have encouraged using stool softener daily instead of as needed.  And using MiraLAX as needed.   Follow up Palliative Care Visit: Palliative care will continue to follow for complex medical decision making, advance care planning, and clarification of goals. Return 8 weeks or prn. Encouraged to call with any questions or concerns  I spent 60 minutes providing this consultation. More than 50% of the time in this consultation was spent in counseling and care coordination.   PPS: 40-30%  HOSPICE ELIGIBILITY/DIAGNOSIS: TBD  Chief Complaint: Follow-up palliative visit  HISTORY OF PRESENT ILLNESS:  Travis Marshall is a 75 y.o. 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  Patient has been feeling a little bit stronger and has been using his walker to walk more.  States that he did have a fall late yesterday afternoon and has been apprehensive to do much walking since then.  States that he has felt weaker since the fall.  Wife states that he has been complaining of his back hurting but he does not think it was related to the fall.  Today patient is not complaining of any pain.  States that his appetite is much better and he has had a 5 pound weight loss over the past month.  A  month ago he was 138 pounds and now currently 143 pounds.  Patient does state that with eating more he is having to go to the bathroom more and does not like having to get up more than he has to due to the his weakness and fear of falling.  Have recommended a bedside commode.  Patient does complain of some constipation with hard stools in which she has to strain.  Wife does give him stool softener as needed.  Wife does have concerns of a lump on his upper chest that she has felt and is concerned that it could be cancer.  Upon palpation it feels as if it is just his rib being more prominent with his recent weight loss.  Patient did not have  any redness, tenderness, warmth to touch with palpation.  Patient does complain of numbness in his feet which she has had for quite some time now.  Rest of 10 point ROS asked and negative except what is stated in HPI.  History obtained from review of EMR and interview with family and Travis Marshall.    Physical Exam:   Constitutional: NAD General: frail appearing, thin EYES: anicteric sclera, lids intact, no discharge ENMT: intact hearing, oral mucous membranes moist CV: S1S2, RRR, no LE edema Pulmonary: LCTA, no increased work of breathing, no cough Abdomen: intake 100%, normo-active BS + 4 quadrants, soft and non tender MSK: starting to ambulate with walker again Skin: warm and dry, no rashes on visible skin Neuro:  A and O x 3 Hem/lymph/immuno: no widespread bruising   Thank you for the opportunity to participate in the care of Travis Marshall.  The palliative care team will continue to follow. Please call our office at [phone] if we can be of additional assistance.   Omar Orrego Jenetta Downer, NP , DNP  This chart was dictated using voice recognition software.  Despite best efforts to proofread,  errors can occur which can change the documentation meaning.   COVID-19 PATIENT SCREENING TOOL Asked and negative response unless otherwise noted:   Have you had symptoms of covid, tested positive or been in contact with someone with symptoms/positive test in the past 5-10 days?

## 2021-05-05 NOTE — Progress Notes (Signed)
Received a call from Hanley Ben, NP.  Patient reportedly fell yesterday.  Orders were requested for home health PT/OT and bedside commode.     Durable Medical Equipment  (From admission, onward)           Start     Ordered   05/05/21 0000  DME Bedside commode       Question:  Patient needs a bedside commode to treat with the following condition  Answer:  Weakness   05/05/21 1511

## 2021-05-08 DIAGNOSIS — E43 Unspecified severe protein-calorie malnutrition: Secondary | ICD-10-CM | POA: Diagnosis not present

## 2021-05-10 ENCOUNTER — Other Ambulatory Visit: Payer: Self-pay | Admitting: Oncology

## 2021-05-10 ENCOUNTER — Encounter: Payer: Self-pay | Admitting: Oncology

## 2021-05-10 ENCOUNTER — Inpatient Hospital Stay (HOSPITAL_BASED_OUTPATIENT_CLINIC_OR_DEPARTMENT_OTHER): Payer: Medicare HMO | Admitting: Oncology

## 2021-05-10 ENCOUNTER — Inpatient Hospital Stay: Payer: Medicare HMO

## 2021-05-10 ENCOUNTER — Telehealth: Payer: Medicare HMO | Admitting: Internal Medicine

## 2021-05-10 ENCOUNTER — Inpatient Hospital Stay: Payer: Medicare HMO | Attending: Oncology

## 2021-05-10 VITALS — BP 93/66 | HR 85 | Temp 97.2°F | Resp 18 | Wt 136.7 lb

## 2021-05-10 DIAGNOSIS — E2740 Unspecified adrenocortical insufficiency: Secondary | ICD-10-CM | POA: Diagnosis not present

## 2021-05-10 DIAGNOSIS — Z5112 Encounter for antineoplastic immunotherapy: Secondary | ICD-10-CM | POA: Insufficient documentation

## 2021-05-10 DIAGNOSIS — E876 Hypokalemia: Secondary | ICD-10-CM

## 2021-05-10 DIAGNOSIS — C7931 Secondary malignant neoplasm of brain: Secondary | ICD-10-CM | POA: Diagnosis not present

## 2021-05-10 DIAGNOSIS — I252 Old myocardial infarction: Secondary | ICD-10-CM | POA: Diagnosis not present

## 2021-05-10 DIAGNOSIS — R0902 Hypoxemia: Secondary | ICD-10-CM | POA: Diagnosis not present

## 2021-05-10 DIAGNOSIS — Z87891 Personal history of nicotine dependence: Secondary | ICD-10-CM | POA: Diagnosis not present

## 2021-05-10 DIAGNOSIS — Z9119 Patient's noncompliance with other medical treatment and regimen: Secondary | ICD-10-CM | POA: Diagnosis not present

## 2021-05-10 DIAGNOSIS — C7B8 Other secondary neuroendocrine tumors: Secondary | ICD-10-CM | POA: Insufficient documentation

## 2021-05-10 DIAGNOSIS — Z79899 Other long term (current) drug therapy: Secondary | ICD-10-CM | POA: Insufficient documentation

## 2021-05-10 DIAGNOSIS — Z809 Family history of malignant neoplasm, unspecified: Secondary | ICD-10-CM | POA: Insufficient documentation

## 2021-05-10 DIAGNOSIS — R739 Hyperglycemia, unspecified: Secondary | ICD-10-CM | POA: Diagnosis not present

## 2021-05-10 DIAGNOSIS — Z95828 Presence of other vascular implants and grafts: Secondary | ICD-10-CM

## 2021-05-10 DIAGNOSIS — Z9049 Acquired absence of other specified parts of digestive tract: Secondary | ICD-10-CM | POA: Diagnosis not present

## 2021-05-10 DIAGNOSIS — Z801 Family history of malignant neoplasm of trachea, bronchus and lung: Secondary | ICD-10-CM | POA: Diagnosis not present

## 2021-05-10 DIAGNOSIS — R627 Adult failure to thrive: Secondary | ICD-10-CM | POA: Insufficient documentation

## 2021-05-10 DIAGNOSIS — C7A1 Malignant poorly differentiated neuroendocrine tumors: Secondary | ICD-10-CM | POA: Insufficient documentation

## 2021-05-10 DIAGNOSIS — R634 Abnormal weight loss: Secondary | ICD-10-CM | POA: Diagnosis not present

## 2021-05-10 DIAGNOSIS — R531 Weakness: Secondary | ICD-10-CM | POA: Insufficient documentation

## 2021-05-10 DIAGNOSIS — C7802 Secondary malignant neoplasm of left lung: Secondary | ICD-10-CM

## 2021-05-10 DIAGNOSIS — E2749 Other adrenocortical insufficiency: Secondary | ICD-10-CM | POA: Diagnosis not present

## 2021-05-10 DIAGNOSIS — N200 Calculus of kidney: Secondary | ICD-10-CM | POA: Insufficient documentation

## 2021-05-10 LAB — CBC WITH DIFFERENTIAL/PLATELET
Abs Immature Granulocytes: 0.04 10*3/uL (ref 0.00–0.07)
Basophils Absolute: 0.1 10*3/uL (ref 0.0–0.1)
Basophils Relative: 1 %
Eosinophils Absolute: 0.2 10*3/uL (ref 0.0–0.5)
Eosinophils Relative: 2 %
HCT: 37 % — ABNORMAL LOW (ref 39.0–52.0)
Hemoglobin: 12.4 g/dL — ABNORMAL LOW (ref 13.0–17.0)
Immature Granulocytes: 0 %
Lymphocytes Relative: 20 %
Lymphs Abs: 2 10*3/uL (ref 0.7–4.0)
MCH: 33.7 pg (ref 26.0–34.0)
MCHC: 33.5 g/dL (ref 30.0–36.0)
MCV: 100.5 fL — ABNORMAL HIGH (ref 80.0–100.0)
Monocytes Absolute: 0.7 10*3/uL (ref 0.1–1.0)
Monocytes Relative: 7 %
Neutro Abs: 7 10*3/uL (ref 1.7–7.7)
Neutrophils Relative %: 70 %
Platelets: 140 10*3/uL — ABNORMAL LOW (ref 150–400)
RBC: 3.68 MIL/uL — ABNORMAL LOW (ref 4.22–5.81)
RDW: 14.6 % (ref 11.5–15.5)
WBC: 10 10*3/uL (ref 4.0–10.5)
nRBC: 0 % (ref 0.0–0.2)

## 2021-05-10 LAB — COMPREHENSIVE METABOLIC PANEL
ALT: 12 U/L (ref 0–44)
AST: 20 U/L (ref 15–41)
Albumin: 3.4 g/dL — ABNORMAL LOW (ref 3.5–5.0)
Alkaline Phosphatase: 41 U/L (ref 38–126)
Anion gap: 8 (ref 5–15)
BUN: 11 mg/dL (ref 8–23)
CO2: 25 mmol/L (ref 22–32)
Calcium: 8.8 mg/dL — ABNORMAL LOW (ref 8.9–10.3)
Chloride: 103 mmol/L (ref 98–111)
Creatinine, Ser: 0.53 mg/dL — ABNORMAL LOW (ref 0.61–1.24)
GFR, Estimated: >60 mL/min (ref >60)
Glucose, Bld: 179 mg/dL — ABNORMAL HIGH (ref 70–99)
Potassium: 3.9 mmol/L (ref 3.5–5.1)
Sodium: 136 mmol/L (ref 135–145)
Total Bilirubin: 1 mg/dL (ref 0.3–1.2)
Total Protein: 5.9 g/dL — ABNORMAL LOW (ref 6.5–8.1)

## 2021-05-10 LAB — TSH: TSH: 0.805 u[IU]/mL (ref 0.350–4.500)

## 2021-05-10 MED ORDER — SODIUM CHLORIDE 0.9 % IV SOLN
Freq: Once | INTRAVENOUS | Status: AC
  Filled 2021-05-10: qty 250

## 2021-05-10 MED ORDER — SODIUM CHLORIDE 0.9% FLUSH
10.0000 mL | Freq: Once | INTRAVENOUS | Status: AC
  Administered 2021-05-10: 10 mL via INTRAVENOUS
  Filled 2021-05-10: qty 10

## 2021-05-10 MED ORDER — POTASSIUM CHLORIDE CRYS ER 20 MEQ PO TBCR: 20.0000 meq | EXTENDED_RELEASE_TABLET | Freq: Every day | ORAL | 1 refills | Status: DC

## 2021-05-10 MED ORDER — HEPARIN SOD (PORK) LOCK FLUSH 100 UNIT/ML IV SOLN
INTRAVENOUS | Status: AC
  Filled 2021-05-10: qty 5

## 2021-05-10 MED ORDER — HEPARIN SOD (PORK) LOCK FLUSH 100 UNIT/ML IV SOLN
500.0000 [IU] | Freq: Once | INTRAVENOUS | Status: AC
  Administered 2021-05-10: 500 [IU] via INTRAVENOUS
  Filled 2021-05-10: qty 5

## 2021-05-10 MED ORDER — SODIUM CHLORIDE 0.9 % IV SOLN
1200.0000 mg | Freq: Once | INTRAVENOUS | Status: AC
  Administered 2021-05-10: 1200 mg via INTRAVENOUS
  Filled 2021-05-10: qty 20
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
## 2021-05-10 NOTE — Progress Notes (Signed)
Pt here for oncology follow up. Feeling better today. Still sleeps a lot. Eating better. Having more mobility in his house with his walker.

## 2021-05-10 NOTE — Patient Instructions (Signed)
[facility]  Discharge Instructions: Thank you for choosing [facility] to provide your oncology and hematology care.  If you have a lab appointment with the [facility], please go directly to the [facility] and check in at the registration area.  Wear comfortable clothing and clothing appropriate for easy access to any Portacath or PICC line.   We strive to give you quality time with your provider. You may need to reschedule your appointment if you arrive late (15 or more minutes).  Arriving late affects you and other patients whose appointments are after yours.  Also, if you miss three or more appointments without notifying the office, you may be dismissed from the clinic at the provider's discretion.      For prescription refill requests, have your pharmacy contact our office and allow 72 hours for refills to be completed.    Today you received the following chemotherapy and/or immunotherapy agents TECENTRIQ      To help prevent nausea and vomiting after your treatment, we encourage you to take your nausea medication as directed.  BELOW ARE SYMPTOMS THAT SHOULD BE REPORTED IMMEDIATELY: *FEVER GREATER THAN 100.4 F (38 C) OR HIGHER *CHILLS OR SWEATING *NAUSEA AND VOMITING THAT IS NOT CONTROLLED WITH YOUR NAUSEA MEDICATION *UNUSUAL SHORTNESS OF BREATH *UNUSUAL BRUISING OR BLEEDING *URINARY PROBLEMS (pain or burning when urinating, or frequent urination) *BOWEL PROBLEMS (unusual diarrhea, constipation, pain near the anus) TENDERNESS IN MOUTH AND THROAT WITH OR WITHOUT PRESENCE OF ULCERS (sore throat, sores in mouth, or a toothache) UNUSUAL RASH, SWELLING OR PAIN  UNUSUAL VAGINAL DISCHARGE OR ITCHING   Items with * indicate a potential emergency and should be followed up as soon as possible or go to the Emergency Department if any problems should occur.  Please show the CHEMOTHERAPY ALERT CARD or IMMUNOTHERAPY ALERT CARD at check-in  to the Emergency Department and triage nurse.  Should you have questions after your visit or need to cancel or reschedule your appointment, please contact [facility]  [phone] and follow the prompts.  Office hours are 8:00 a.m. to 4:30 p.m. Monday - Friday. Please note that voicemails left after 4:00 p.m. may not be returned until the following business day.  We are closed weekends and major holidays. You have access to a nurse at all times for urgent questions. Please call the main number to the clinic [phone] and follow the prompts.  For any non-urgent questions, you may also contact your provider using MyChart. We now offer e-Visits for anyone 28 and older to request care online for non-urgent symptoms. For details visit [email or web address].   Also download the MyChart app! Go to the app store, search "MyChart", open the app, select [facility], and log in with your MyChart username and password.  Due to Covid, a mask is required upon entering the hospital/clinic. If you do not have a mask, one will be given to you upon arrival. For doctor visits, patients may have 1 support person aged 108 or older with them. For treatment visits, patients cannot have anyone with them due to current Covid guidelines and our immunocompromised population.   Atezolizumab injection What is this medication? ATEZOLIZUMAB (a te zoe LIZ ue mab) is a monoclonal antibody. It is used to treat bladder cancer (urothelial cancer), liver cancer, lung cancer, andmelanoma. This medicine may be used for other purposes; ask your health care provider orpharmacist if you have questions. COMMON BRAND NAME(S): Tecentriq What should  I tell my care team before I take this medication? They need to know if you have any of these conditions: autoimmune diseases like Crohn's disease, ulcerative colitis, or lupus have had or planning to have an allogeneic stem cell transplant (uses someone  else's stem cells) history of organ transplant history of radiation to the chest nervous system problems like myasthenia gravis or Guillain-Barre syndrome an unusual or allergic reaction to atezolizumab, other medicines, foods, dyes, or preservatives pregnant or trying to get pregnant breast-feeding How should I use this medication? This medicine is for infusion into a vein. It is given by a health careprofessional in a hospital or clinic setting. A special MedGuide will be given to you before each treatment. Be sure to readthis information carefully each time. Talk to your pediatrician regarding the use of this medicine in children.Special care may be needed. Overdosage: If you think you have taken too much of this medicine contact apoison control center or emergency room at once. NOTE: This medicine is only for you. Do not share this medicine with others. What if I miss a dose? It is important not to miss your dose. Call your doctor or health careprofessional if you are unable to keep an appointment. What may interact with this medication? Interactions have not been studied. This list may not describe all possible interactions. Give your health care provider a list of all the medicines, herbs, non-prescription drugs, or dietary supplements you use. Also tell them if you smoke, drink alcohol, or use illegaldrugs. Some items may interact with your medicine. What should I watch for while using this medication? Your condition will be monitored carefully while you are receiving thismedicine. You may need blood work done while you are taking this medicine. Do not become pregnant while taking this medicine or for at least 5 months after stopping it. Women should inform their doctor if they wish to become pregnant or think they might be pregnant. There is a potential for serious side effects to an unborn child. Talk to your health care professional or pharmacist for more information. Do not  breast-feed an infant while taking this medicineor for at least 5 months after the last dose. What side effects may I notice from receiving this medication? Side effects that you should report to your doctor or health care professionalas soon as possible: allergic reactions like skin rash, itching or hives, swelling of the face, lips, or tongue black, tarry stools bloody or watery diarrhea breathing problems changes in vision chest pain or chest tightness chills facial flushing fever headache signs and symptoms of high blood sugar such as dizziness; dry mouth; dry skin; fruity breath; nausea; stomach pain; increased hunger or thirst; increased urination signs and symptoms of liver injury like dark yellow or brown urine; general ill feeling or flu-like symptoms; light-colored stools; loss of appetite; nausea; right upper belly pain; unusually weak or tired; yellowing of the eyes or skin stomach pain trouble passing urine or change in the amount of urine Side effects that usually do not require medical attention (report to yourdoctor or health care professional if they continue or are bothersome): bone pain cough diarrhea joint pain muscle pain muscle weakness swelling of arms or legs tiredness weight loss This list may not describe all possible side effects. Call your doctor for medical advice about side effects. You may report side effects to FDA at1-800-FDA-1088. Where should I keep my medication? This drug is given in a hospital or clinic and will not be stored at  home. NOTE: This sheet is a summary. It may not cover all possible information. If you have questions about this medicine, talk to your doctor, pharmacist, orhealth care provider.  2022 Elsevier/Gold Standard (2020-07-21 13:59:34)

## 2021-05-11 LAB — T4: T4, Total: 6.9 ug/dL (ref 4.5–12.0)

## 2021-05-12 DIAGNOSIS — E1165 Type 2 diabetes mellitus with hyperglycemia: Secondary | ICD-10-CM | POA: Diagnosis not present

## 2021-05-12 DIAGNOSIS — E2740 Unspecified adrenocortical insufficiency: Secondary | ICD-10-CM | POA: Diagnosis not present

## 2021-05-12 DIAGNOSIS — C7A8 Other malignant neuroendocrine tumors: Secondary | ICD-10-CM | POA: Diagnosis not present

## 2021-05-12 DIAGNOSIS — Z7952 Long term (current) use of systemic steroids: Secondary | ICD-10-CM | POA: Diagnosis not present

## 2021-05-15 DIAGNOSIS — E2740 Unspecified adrenocortical insufficiency: Secondary | ICD-10-CM | POA: Diagnosis not present

## 2021-05-17 DIAGNOSIS — E2740 Unspecified adrenocortical insufficiency: Secondary | ICD-10-CM | POA: Diagnosis not present

## 2021-05-17 DIAGNOSIS — R530 Neoplastic (malignant) related fatigue: Secondary | ICD-10-CM | POA: Diagnosis not present

## 2021-05-17 DIAGNOSIS — I2510 Atherosclerotic heart disease of native coronary artery without angina pectoris: Secondary | ICD-10-CM | POA: Diagnosis not present

## 2021-05-17 DIAGNOSIS — I119 Hypertensive heart disease without heart failure: Secondary | ICD-10-CM | POA: Diagnosis not present

## 2021-05-17 DIAGNOSIS — C7A1 Malignant poorly differentiated neuroendocrine tumors: Secondary | ICD-10-CM | POA: Diagnosis not present

## 2021-05-17 DIAGNOSIS — D61810 Antineoplastic chemotherapy induced pancytopenia: Secondary | ICD-10-CM | POA: Diagnosis not present

## 2021-05-17 DIAGNOSIS — C7B8 Other secondary neuroendocrine tumors: Secondary | ICD-10-CM | POA: Diagnosis not present

## 2021-05-17 DIAGNOSIS — E876 Hypokalemia: Secondary | ICD-10-CM | POA: Diagnosis not present

## 2021-05-18 DIAGNOSIS — C7A1 Malignant poorly differentiated neuroendocrine tumors: Secondary | ICD-10-CM | POA: Diagnosis not present

## 2021-05-18 DIAGNOSIS — I2510 Atherosclerotic heart disease of native coronary artery without angina pectoris: Secondary | ICD-10-CM | POA: Diagnosis not present

## 2021-05-18 DIAGNOSIS — E876 Hypokalemia: Secondary | ICD-10-CM | POA: Diagnosis not present

## 2021-05-18 DIAGNOSIS — C7B8 Other secondary neuroendocrine tumors: Secondary | ICD-10-CM | POA: Diagnosis not present

## 2021-05-18 DIAGNOSIS — D61810 Antineoplastic chemotherapy induced pancytopenia: Secondary | ICD-10-CM | POA: Diagnosis not present

## 2021-05-18 DIAGNOSIS — I119 Hypertensive heart disease without heart failure: Secondary | ICD-10-CM | POA: Diagnosis not present

## 2021-05-18 DIAGNOSIS — E2740 Unspecified adrenocortical insufficiency: Secondary | ICD-10-CM | POA: Diagnosis not present

## 2021-05-18 DIAGNOSIS — R530 Neoplastic (malignant) related fatigue: Secondary | ICD-10-CM | POA: Diagnosis not present

## 2021-05-23 DIAGNOSIS — I2510 Atherosclerotic heart disease of native coronary artery without angina pectoris: Secondary | ICD-10-CM | POA: Diagnosis not present

## 2021-05-23 DIAGNOSIS — R530 Neoplastic (malignant) related fatigue: Secondary | ICD-10-CM | POA: Diagnosis not present

## 2021-05-23 DIAGNOSIS — C7A1 Malignant poorly differentiated neuroendocrine tumors: Secondary | ICD-10-CM | POA: Diagnosis not present

## 2021-05-23 DIAGNOSIS — E876 Hypokalemia: Secondary | ICD-10-CM | POA: Diagnosis not present

## 2021-05-23 DIAGNOSIS — I119 Hypertensive heart disease without heart failure: Secondary | ICD-10-CM | POA: Diagnosis not present

## 2021-05-23 DIAGNOSIS — C7B8 Other secondary neuroendocrine tumors: Secondary | ICD-10-CM | POA: Diagnosis not present

## 2021-05-23 DIAGNOSIS — E2740 Unspecified adrenocortical insufficiency: Secondary | ICD-10-CM | POA: Diagnosis not present

## 2021-05-23 DIAGNOSIS — D61810 Antineoplastic chemotherapy induced pancytopenia: Secondary | ICD-10-CM | POA: Diagnosis not present

## 2021-05-26 DIAGNOSIS — I119 Hypertensive heart disease without heart failure: Secondary | ICD-10-CM | POA: Diagnosis not present

## 2021-05-26 DIAGNOSIS — C7B8 Other secondary neuroendocrine tumors: Secondary | ICD-10-CM | POA: Diagnosis not present

## 2021-05-26 DIAGNOSIS — C7A1 Malignant poorly differentiated neuroendocrine tumors: Secondary | ICD-10-CM | POA: Diagnosis not present

## 2021-05-26 DIAGNOSIS — D61810 Antineoplastic chemotherapy induced pancytopenia: Secondary | ICD-10-CM | POA: Diagnosis not present

## 2021-05-26 DIAGNOSIS — E876 Hypokalemia: Secondary | ICD-10-CM | POA: Diagnosis not present

## 2021-05-26 DIAGNOSIS — E2740 Unspecified adrenocortical insufficiency: Secondary | ICD-10-CM | POA: Diagnosis not present

## 2021-05-26 DIAGNOSIS — I2510 Atherosclerotic heart disease of native coronary artery without angina pectoris: Secondary | ICD-10-CM | POA: Diagnosis not present

## 2021-05-26 DIAGNOSIS — R530 Neoplastic (malignant) related fatigue: Secondary | ICD-10-CM | POA: Diagnosis not present

## 2021-05-31 ENCOUNTER — Inpatient Hospital Stay: Payer: Medicare HMO

## 2021-05-31 ENCOUNTER — Encounter: Payer: Self-pay | Admitting: Oncology

## 2021-05-31 ENCOUNTER — Inpatient Hospital Stay (HOSPITAL_BASED_OUTPATIENT_CLINIC_OR_DEPARTMENT_OTHER): Payer: Medicare HMO | Admitting: Oncology

## 2021-05-31 VITALS — BP 137/112 | HR 91 | Temp 98.2°F | Resp 18 | Wt 132.0 lb

## 2021-05-31 DIAGNOSIS — C7A1 Malignant poorly differentiated neuroendocrine tumors: Secondary | ICD-10-CM

## 2021-05-31 DIAGNOSIS — R634 Abnormal weight loss: Secondary | ICD-10-CM | POA: Diagnosis not present

## 2021-05-31 DIAGNOSIS — E2749 Other adrenocortical insufficiency: Secondary | ICD-10-CM | POA: Diagnosis not present

## 2021-05-31 DIAGNOSIS — M549 Dorsalgia, unspecified: Secondary | ICD-10-CM

## 2021-05-31 DIAGNOSIS — Z5112 Encounter for antineoplastic immunotherapy: Secondary | ICD-10-CM | POA: Diagnosis not present

## 2021-05-31 DIAGNOSIS — I252 Old myocardial infarction: Secondary | ICD-10-CM | POA: Diagnosis not present

## 2021-05-31 DIAGNOSIS — R627 Adult failure to thrive: Secondary | ICD-10-CM | POA: Diagnosis not present

## 2021-05-31 DIAGNOSIS — R531 Weakness: Secondary | ICD-10-CM | POA: Diagnosis not present

## 2021-05-31 DIAGNOSIS — C7802 Secondary malignant neoplasm of left lung: Secondary | ICD-10-CM

## 2021-05-31 DIAGNOSIS — F32A Depression, unspecified: Secondary | ICD-10-CM | POA: Diagnosis not present

## 2021-05-31 DIAGNOSIS — E2740 Unspecified adrenocortical insufficiency: Secondary | ICD-10-CM | POA: Diagnosis not present

## 2021-05-31 DIAGNOSIS — R5383 Other fatigue: Secondary | ICD-10-CM | POA: Diagnosis not present

## 2021-05-31 DIAGNOSIS — C7931 Secondary malignant neoplasm of brain: Secondary | ICD-10-CM

## 2021-05-31 DIAGNOSIS — R739 Hyperglycemia, unspecified: Secondary | ICD-10-CM | POA: Diagnosis not present

## 2021-05-31 DIAGNOSIS — C7B8 Other secondary neuroendocrine tumors: Secondary | ICD-10-CM | POA: Diagnosis not present

## 2021-05-31 LAB — CBC WITH DIFFERENTIAL/PLATELET
Abs Immature Granulocytes: 0.03 10*3/uL (ref 0.00–0.07)
Basophils Absolute: 0.1 10*3/uL (ref 0.0–0.1)
Basophils Relative: 1 %
Eosinophils Absolute: 0.2 10*3/uL (ref 0.0–0.5)
Eosinophils Relative: 2 %
HCT: 41.1 % (ref 39.0–52.0)
Hemoglobin: 14.2 g/dL (ref 13.0–17.0)
Immature Granulocytes: 0 %
Lymphocytes Relative: 25 %
Lymphs Abs: 2.3 10*3/uL (ref 0.7–4.0)
MCH: 33.3 pg (ref 26.0–34.0)
MCHC: 34.5 g/dL (ref 30.0–36.0)
MCV: 96.5 fL (ref 80.0–100.0)
Monocytes Absolute: 0.7 10*3/uL (ref 0.1–1.0)
Monocytes Relative: 8 %
Neutro Abs: 6.1 10*3/uL (ref 1.7–7.7)
Neutrophils Relative %: 64 %
Platelets: 225 10*3/uL (ref 150–400)
RBC: 4.26 MIL/uL (ref 4.22–5.81)
RDW: 13.2 % (ref 11.5–15.5)
WBC: 9.4 10*3/uL (ref 4.0–10.5)
nRBC: 0 % (ref 0.0–0.2)

## 2021-05-31 LAB — COMPREHENSIVE METABOLIC PANEL
ALT: 9 U/L (ref 0–44)
AST: 15 U/L (ref 15–41)
Albumin: 4.3 g/dL (ref 3.5–5.0)
Alkaline Phosphatase: 37 U/L — ABNORMAL LOW (ref 38–126)
Anion gap: 10 (ref 5–15)
BUN: 17 mg/dL (ref 8–23)
CO2: 20 mmol/L — ABNORMAL LOW (ref 22–32)
Calcium: 9 mg/dL (ref 8.9–10.3)
Chloride: 105 mmol/L (ref 98–111)
Creatinine, Ser: 0.7 mg/dL (ref 0.61–1.24)
GFR, Estimated: >60 mL/min (ref >60)
Glucose, Bld: 170 mg/dL — ABNORMAL HIGH (ref 70–99)
Potassium: 4.1 mmol/L (ref 3.5–5.1)
Sodium: 135 mmol/L (ref 135–145)
Total Bilirubin: 0.8 mg/dL (ref 0.3–1.2)
Total Protein: 6.9 g/dL (ref 6.5–8.1)

## 2021-05-31 MED ORDER — SODIUM CHLORIDE 0.9 % IV SOLN
Freq: Once | INTRAVENOUS | Status: AC
  Filled 2021-05-31: qty 250

## 2021-05-31 MED ORDER — HEPARIN SOD (PORK) LOCK FLUSH 100 UNIT/ML IV SOLN
INTRAVENOUS | Status: AC
  Filled 2021-05-31: qty 5

## 2021-05-31 MED ORDER — HEPARIN SOD (PORK) LOCK FLUSH 100 UNIT/ML IV SOLN
500.0000 [IU] | Freq: Once | INTRAVENOUS | Status: AC
  Administered 2021-05-31: 500 [IU] via INTRAVENOUS
  Filled 2021-05-31: qty 5

## 2021-05-31 MED ORDER — SODIUM CHLORIDE 0.9% FLUSH
10.0000 mL | Freq: Once | INTRAVENOUS | Status: AC
  Administered 2021-05-31: 10 mL via INTRAVENOUS
  Filled 2021-05-31: qty 10

## 2021-05-31 MED ORDER — MIRTAZAPINE 30 MG PO TABS: 30.0000 mg | ORAL_TABLET | Freq: Every day | ORAL | 0 refills | Status: AC

## 2021-05-31 NOTE — Progress Notes (Signed)
Nutrition Follow-up:   Patient with metastastic lung high grade neuroendocrine carcinoma.  Patient completed brain radiation on 4/22.  Patient receiving pallative chemotherapy.    Met with patient during infusion.  Patient hard of hearing.  Reports no appetite.  Patient concerned about grandson staying in house while no one is home.  "I don't want anybody staying in my house without me there."  Patient hard to redirect.  Says that he took a few bites of vegetable soup but couldn't eat anymore.  "I know I need to eat but I can't."   Medications: remeron  Labs: reviewed  Anthropometrics:   Weight 132 lb today  136 lb 11.2 oz on 7/6 143 lb 9.6 oz on 6/22 137 lb on 5/11 139 lb on 12.8 oz on 5/4 UBW of 149 on 4/20   NUTRITION DIAGNOSIS: Inadequate oral intake continues   INTERVENTION:  Active listening provided Encouraged patient to eat as able Agree with remeron being added   MONITORING, EVALUATION, GOAL: weight trends, intake   NEXT VISIT: as needed  Felma Pfefferle B. Zenia Resides, [place], [place] Registered Dietitian [phone] (mobile)

## 2021-05-31 NOTE — Progress Notes (Signed)
Pt in for follow up, states he has gotten much worse in last week.  No appetite, sleeps all the time and severe back pain.
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
## 2021-06-02 DIAGNOSIS — I119 Hypertensive heart disease without heart failure: Secondary | ICD-10-CM | POA: Diagnosis not present

## 2021-06-02 DIAGNOSIS — C7A1 Malignant poorly differentiated neuroendocrine tumors: Secondary | ICD-10-CM | POA: Diagnosis not present

## 2021-06-02 DIAGNOSIS — C7B8 Other secondary neuroendocrine tumors: Secondary | ICD-10-CM | POA: Diagnosis not present

## 2021-06-02 DIAGNOSIS — E2740 Unspecified adrenocortical insufficiency: Secondary | ICD-10-CM | POA: Diagnosis not present

## 2021-06-02 DIAGNOSIS — I2510 Atherosclerotic heart disease of native coronary artery without angina pectoris: Secondary | ICD-10-CM | POA: Diagnosis not present

## 2021-06-02 DIAGNOSIS — R530 Neoplastic (malignant) related fatigue: Secondary | ICD-10-CM | POA: Diagnosis not present

## 2021-06-02 DIAGNOSIS — D61810 Antineoplastic chemotherapy induced pancytopenia: Secondary | ICD-10-CM | POA: Diagnosis not present

## 2021-06-02 DIAGNOSIS — E876 Hypokalemia: Secondary | ICD-10-CM | POA: Diagnosis not present

## 2021-06-06 DIAGNOSIS — E2740 Unspecified adrenocortical insufficiency: Secondary | ICD-10-CM | POA: Diagnosis not present

## 2021-06-06 DIAGNOSIS — R530 Neoplastic (malignant) related fatigue: Secondary | ICD-10-CM | POA: Diagnosis not present

## 2021-06-06 DIAGNOSIS — C7A1 Malignant poorly differentiated neuroendocrine tumors: Secondary | ICD-10-CM | POA: Diagnosis not present

## 2021-06-06 DIAGNOSIS — I2510 Atherosclerotic heart disease of native coronary artery without angina pectoris: Secondary | ICD-10-CM | POA: Diagnosis not present

## 2021-06-06 DIAGNOSIS — E876 Hypokalemia: Secondary | ICD-10-CM | POA: Diagnosis not present

## 2021-06-06 DIAGNOSIS — D61810 Antineoplastic chemotherapy induced pancytopenia: Secondary | ICD-10-CM | POA: Diagnosis not present

## 2021-06-06 DIAGNOSIS — C7B8 Other secondary neuroendocrine tumors: Secondary | ICD-10-CM | POA: Diagnosis not present

## 2021-06-06 DIAGNOSIS — I119 Hypertensive heart disease without heart failure: Secondary | ICD-10-CM | POA: Diagnosis not present

## 2021-06-08 ENCOUNTER — Ambulatory Visit: Admission: RE | Admit: 2021-06-08 | Payer: Medicare HMO | Source: Ambulatory Visit

## 2021-06-12 ENCOUNTER — Other Ambulatory Visit: Payer: Self-pay

## 2021-06-12 ENCOUNTER — Emergency Department: Payer: Medicare HMO

## 2021-06-12 ENCOUNTER — Emergency Department
Admission: EM | Admit: 2021-06-12 | Discharge: 2021-06-12 | Disposition: A | Discharge status: Home / Self Care | Payer: Medicare HMO | Attending: Emergency Medicine | Admitting: Emergency Medicine

## 2021-06-12 ENCOUNTER — Encounter: Payer: Self-pay | Admitting: Emergency Medicine

## 2021-06-12 DIAGNOSIS — I2510 Atherosclerotic heart disease of native coronary artery without angina pectoris: Secondary | ICD-10-CM | POA: Diagnosis not present

## 2021-06-12 DIAGNOSIS — Z7189 Other specified counseling: Secondary | ICD-10-CM | POA: Insufficient documentation

## 2021-06-12 DIAGNOSIS — R339 Retention of urine, unspecified: Secondary | ICD-10-CM | POA: Diagnosis not present

## 2021-06-12 DIAGNOSIS — I10 Essential (primary) hypertension: Secondary | ICD-10-CM | POA: Insufficient documentation

## 2021-06-12 DIAGNOSIS — C799 Secondary malignant neoplasm of unspecified site: Secondary | ICD-10-CM | POA: Insufficient documentation

## 2021-06-12 DIAGNOSIS — Z79899 Other long term (current) drug therapy: Secondary | ICD-10-CM | POA: Insufficient documentation

## 2021-06-12 DIAGNOSIS — R4182 Altered mental status, unspecified: Secondary | ICD-10-CM | POA: Insufficient documentation

## 2021-06-12 DIAGNOSIS — E876 Hypokalemia: Secondary | ICD-10-CM | POA: Diagnosis not present

## 2021-06-12 DIAGNOSIS — E871 Hypo-osmolality and hyponatremia: Secondary | ICD-10-CM | POA: Diagnosis not present

## 2021-06-12 DIAGNOSIS — Z85820 Personal history of malignant melanoma of skin: Secondary | ICD-10-CM | POA: Diagnosis not present

## 2021-06-12 DIAGNOSIS — R404 Transient alteration of awareness: Secondary | ICD-10-CM | POA: Diagnosis not present

## 2021-06-12 DIAGNOSIS — Z87891 Personal history of nicotine dependence: Secondary | ICD-10-CM | POA: Insufficient documentation

## 2021-06-12 DIAGNOSIS — R410 Disorientation, unspecified: Secondary | ICD-10-CM | POA: Diagnosis not present

## 2021-06-12 LAB — CBC WITH DIFFERENTIAL/PLATELET
Abs Immature Granulocytes: 0.03 10*3/uL (ref 0.00–0.07)
Basophils Absolute: 0.1 10*3/uL (ref 0.0–0.1)
Basophils Relative: 1 %
Eosinophils Absolute: 0.1 10*3/uL (ref 0.0–0.5)
Eosinophils Relative: 1 %
HCT: 47.2 % (ref 39.0–52.0)
Hemoglobin: 16.6 g/dL (ref 13.0–17.0)
Immature Granulocytes: 0 %
Lymphocytes Relative: 30 %
Lymphs Abs: 2.2 10*3/uL (ref 0.7–4.0)
MCH: 34.4 pg — ABNORMAL HIGH (ref 26.0–34.0)
MCHC: 35.2 g/dL (ref 30.0–36.0)
MCV: 97.7 fL (ref 80.0–100.0)
Monocytes Absolute: 0.6 10*3/uL (ref 0.1–1.0)
Monocytes Relative: 8 %
Neutro Abs: 4.3 10*3/uL (ref 1.7–7.7)
Neutrophils Relative %: 60 %
Platelets: 243 10*3/uL (ref 150–400)
RBC: 4.83 MIL/uL (ref 4.22–5.81)
RDW: 13.3 % (ref 11.5–15.5)
WBC: 7.3 10*3/uL (ref 4.0–10.5)
nRBC: 0 % (ref 0.0–0.2)

## 2021-06-12 LAB — COMPREHENSIVE METABOLIC PANEL
ALT: 10 U/L (ref 0–44)
AST: 17 U/L (ref 15–41)
Albumin: 4.2 g/dL (ref 3.5–5.0)
Alkaline Phosphatase: 41 U/L (ref 38–126)
Anion gap: 15 (ref 5–15)
BUN: 13 mg/dL (ref 8–23)
CO2: 20 mmol/L — ABNORMAL LOW (ref 22–32)
Calcium: 9.6 mg/dL (ref 8.9–10.3)
Chloride: 104 mmol/L (ref 98–111)
Creatinine, Ser: 0.68 mg/dL (ref 0.61–1.24)
GFR, Estimated: >60 mL/min (ref >60)
Glucose, Bld: 130 mg/dL — ABNORMAL HIGH (ref 70–99)
Potassium: 3.9 mmol/L (ref 3.5–5.1)
Sodium: 139 mmol/L (ref 135–145)
Total Bilirubin: 1.8 mg/dL — ABNORMAL HIGH (ref 0.3–1.2)
Total Protein: 7.2 g/dL (ref 6.5–8.1)

## 2021-06-12 LAB — TSH: TSH: 1.07 u[IU]/mL (ref 0.350–4.500)

## 2021-06-12 IMAGING — CT CT HEAD W/O CM
4 series · 15 of 47 positions shown, 17 images · non-contrast
Comparison: CT head [DATE]

CLINICAL DATA: Mental status change, history lung cancer with
metastatic disease to the brain.

EXAM:
CT HEAD WITHOUT CONTRAST
TECHNIQUE: Contiguous axial images were obtained from the base of the skull
through the vertex without intravenous contrast.

[Series 2: head bone · axial · 0.40mm/px · z∈[-98,-84]mm · 2 of 73 slices shown]
[im 8/73  bone]
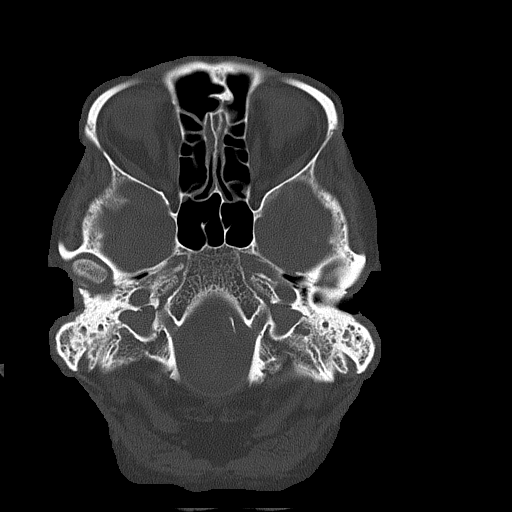
[im 15/73  bone]
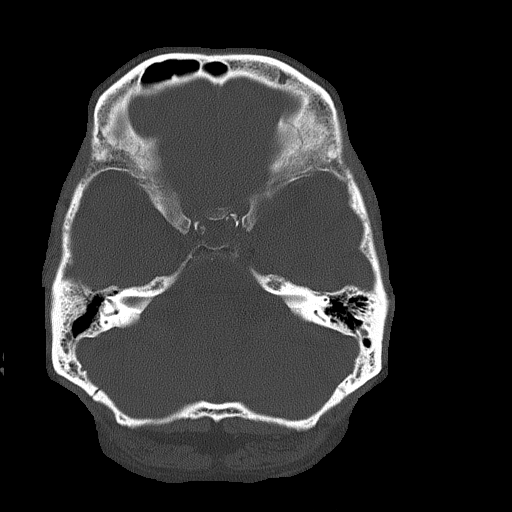

[Series 3: coronal soft tissue · coronal · 0.31mm/px · 3 of 82 slices shown]
[im 28/82  brain]
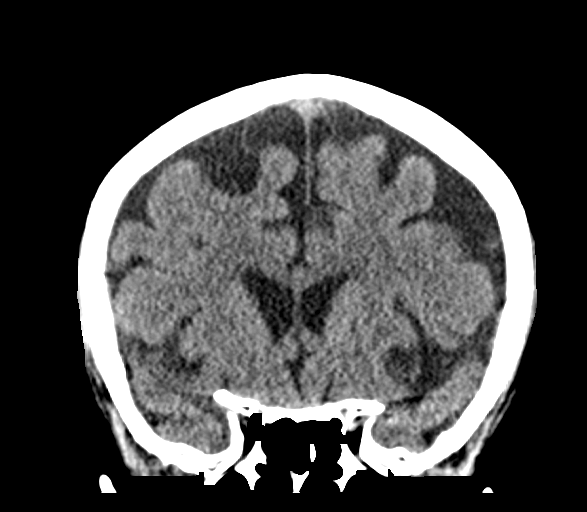
[im 37/82  brain]
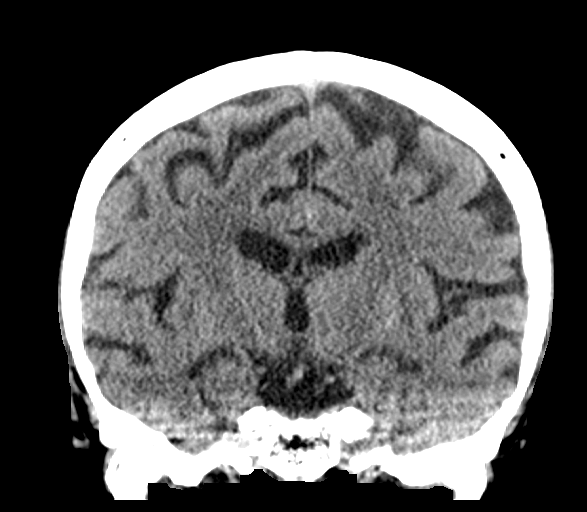
[im 46/82  brain]
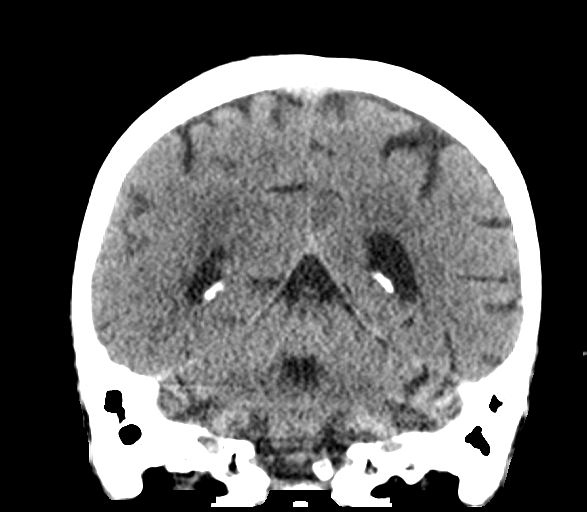

[Series 4: sagittal soft tissue · sagittal · 0.33mm/px · 3 of 84 slices shown]
[im 28/84  brain]
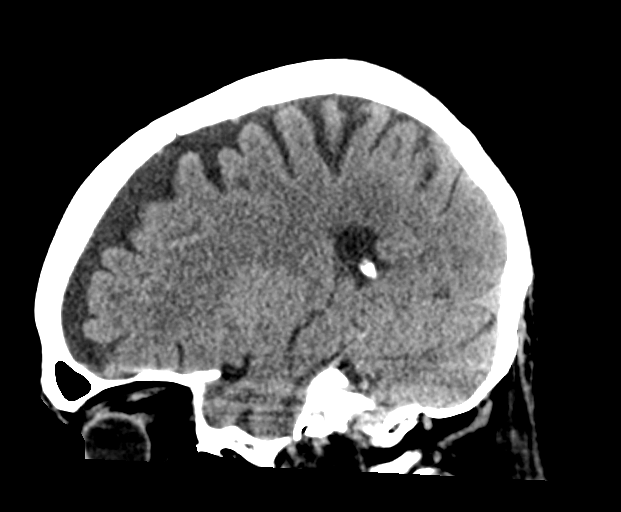
[im 42/84  brain]
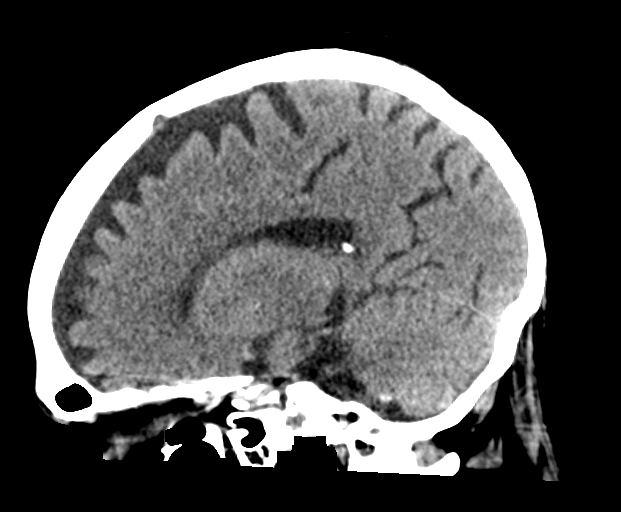
[im 56/84  brain]
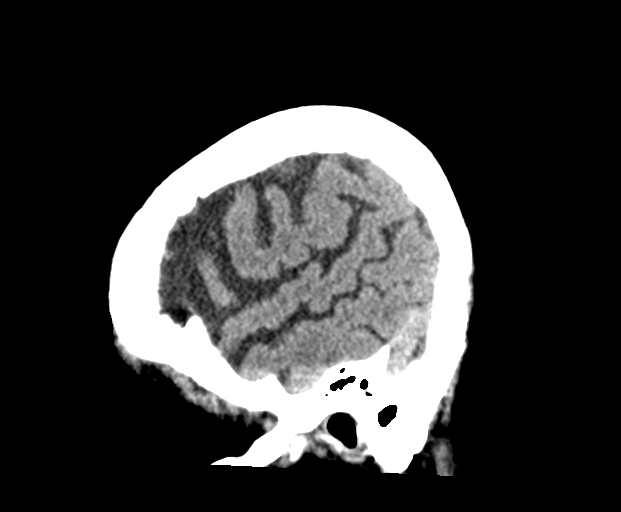

[Series 5: ax head wo · axial · 0.32mm/px · z∈[-97,+18]mm · 7 of 31 slices shown, 9 images]
[im 4/31  brain]
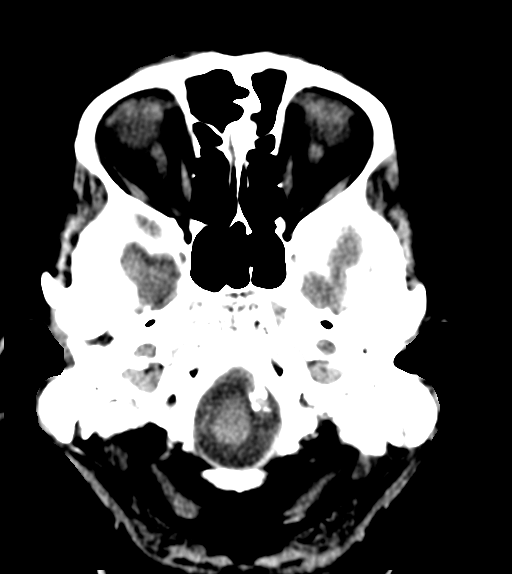
[im 4/31  bone]
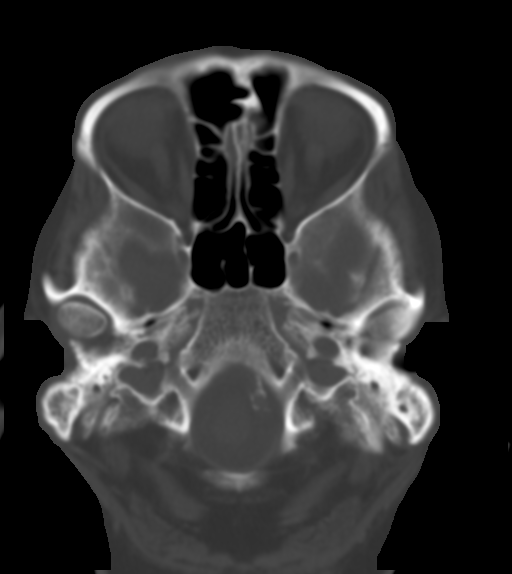
[im 8/31  brain]
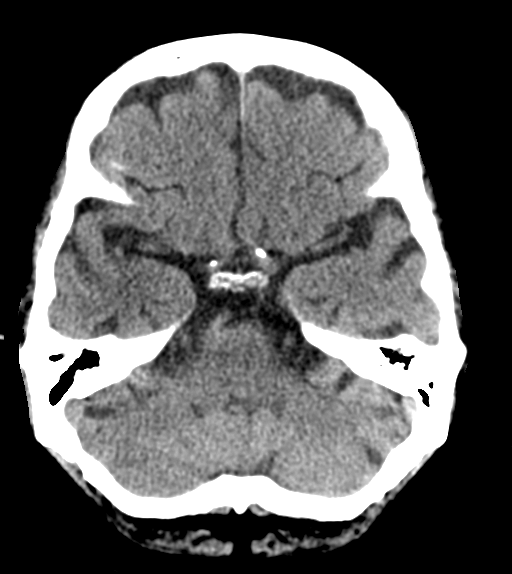
[im 12/31  brain]
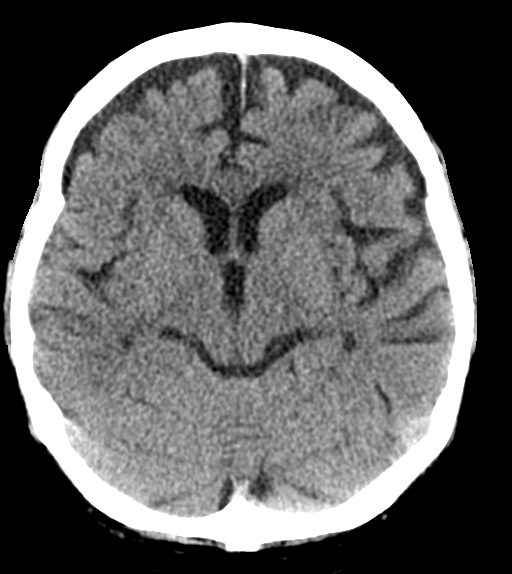
[im 16/31  brain]
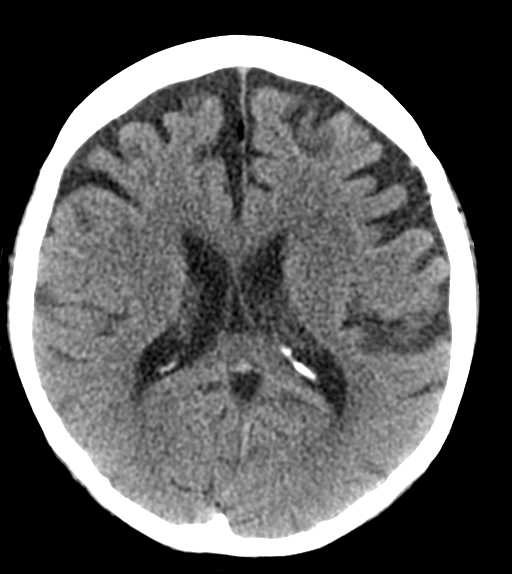
[im 19/31  brain]
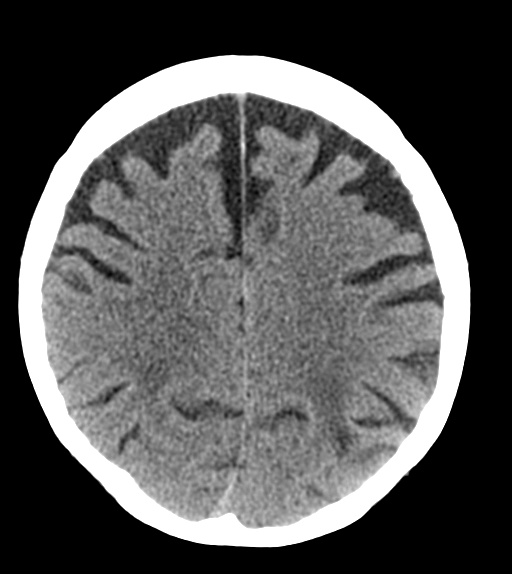
[im 19/31  bone]
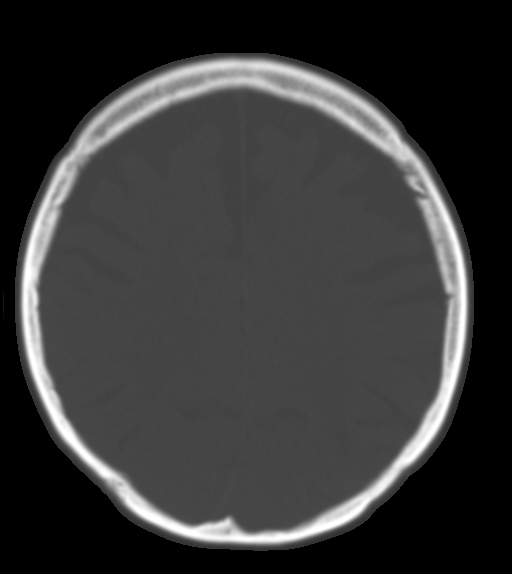
[im 23/31  brain]
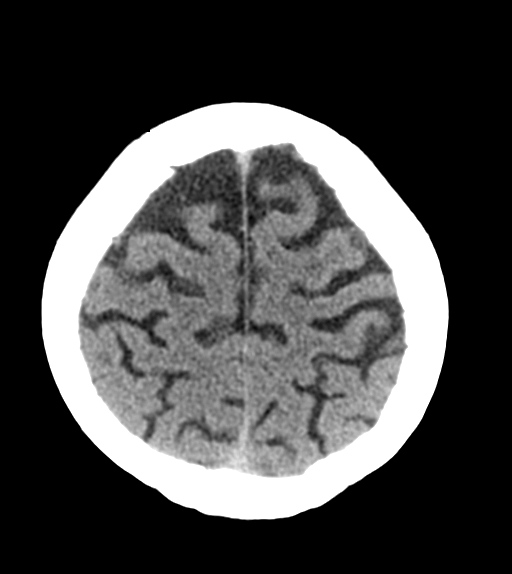
[im 27/31  brain]
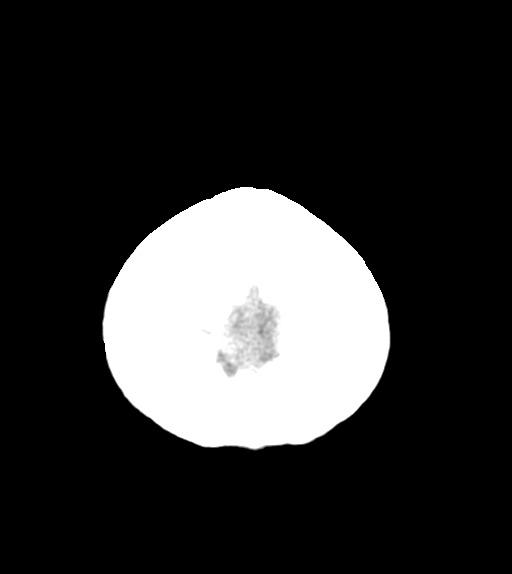

[15 of 47 positions shown; findings below may reference images not displayed]

FINDINGS: Brain: No evidence of acute infarction, hemorrhage, hydrocephalus,
or extra-axial collection. The subcentimeter lesions located in the
posterior left temporal lobe and inferior left cerebellar hemisphere
on MRI [DATE] are not appreciable on this exam. No new mass
lesion or mass effect.

Vascular: No hyperdense vessel or unexpected calcification.

Skull: Normal. Negative for fracture or focal lesion.

Sinuses/Orbits: Paranasal sinuses are clear. Opacification some of
the posterior mastoid air cells bilaterally, similar to [DATE].

Other: None.
IMPRESSION: No acute abnormality. Previously seen metastatic disease is not well
appreciated on today's exam.

## 2021-06-12 MED ORDER — OXYCODONE HCL 5 MG PO TABS
5.0000 mg | ORAL_TABLET | ORAL | Status: AC
  Administered 2021-06-12: 5 mg via ORAL
  Filled 2021-06-12: qty 1

## 2021-06-12 NOTE — ED Notes (Signed)
Pt not able to tolerate in and out cath at this time. Male purewick placed on pt

## 2021-06-12 NOTE — ED Notes (Signed)
Pt bladder scan >1000 ml retained. Wife requesting RN drain pt's bladder with in and out cath

## 2021-06-12 NOTE — ED Triage Notes (Signed)
Pt to ED via [facility] from home. Pt lung cancer pt that has metastasized to the brain. Pt currently not taking chemo medication, pt stating he makes him not want to eat. EMS stating pt hasn't been able to eat x1 wk. EMS stating wife lives at home with pt and has noticed increased confusion. Pt PCP wanted pt to be evaluated. CBG 120. VSS. Pt A&Ox2.

## 2021-06-12 NOTE — Progress Notes (Signed)
[facility] Liaison note:  New referral for [facility] hospice services at home received from [place] Dr. Hulan Saas.  Patient information sent to referral. Hospice eligibility pending review.  Writer met at bedside with patient and his wife to initiate education regarding hospice services, philosophy, and team approach to care with understanding voiced.  DME needs discussed. Mrs. Devonshire would like a hospital bed, OBT and transport wheelchair. Plan is for discharge from the ED today. She will take him via [facility]. Hospice information and contact numbers given to Mrs. Pucci Dr. Tamala Julian updated. Thank you for the opportunity to be involved in the care of this patient.  Flo Shanks BSN, RN, [place] [phone]

## 2021-06-12 NOTE — ED Notes (Signed)
Pt transported to CT. Family at bedside.
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
## 2021-06-13 DIAGNOSIS — E2740 Unspecified adrenocortical insufficiency: Secondary | ICD-10-CM | POA: Diagnosis not present

## 2021-06-13 DIAGNOSIS — C7B8 Other secondary neuroendocrine tumors: Secondary | ICD-10-CM | POA: Diagnosis not present

## 2021-06-13 DIAGNOSIS — C7A1 Malignant poorly differentiated neuroendocrine tumors: Secondary | ICD-10-CM | POA: Diagnosis not present

## 2021-06-13 DIAGNOSIS — R530 Neoplastic (malignant) related fatigue: Secondary | ICD-10-CM | POA: Diagnosis not present

## 2021-06-13 DIAGNOSIS — D61810 Antineoplastic chemotherapy induced pancytopenia: Secondary | ICD-10-CM | POA: Diagnosis not present

## 2021-06-13 DIAGNOSIS — E876 Hypokalemia: Secondary | ICD-10-CM | POA: Diagnosis not present

## 2021-06-15 DIAGNOSIS — D61810 Antineoplastic chemotherapy induced pancytopenia: Secondary | ICD-10-CM | POA: Diagnosis not present

## 2021-06-15 DIAGNOSIS — E2740 Unspecified adrenocortical insufficiency: Secondary | ICD-10-CM | POA: Diagnosis not present

## 2021-06-15 DIAGNOSIS — R530 Neoplastic (malignant) related fatigue: Secondary | ICD-10-CM | POA: Diagnosis not present

## 2021-06-15 DIAGNOSIS — C7A1 Malignant poorly differentiated neuroendocrine tumors: Secondary | ICD-10-CM | POA: Diagnosis not present

## 2021-06-15 DIAGNOSIS — E876 Hypokalemia: Secondary | ICD-10-CM | POA: Diagnosis not present

## 2021-06-15 DIAGNOSIS — C7B8 Other secondary neuroendocrine tumors: Secondary | ICD-10-CM | POA: Diagnosis not present

## 2021-06-22 ENCOUNTER — Other Ambulatory Visit: Payer: Self-pay | Admitting: Hospice and Palliative Medicine

## 2021-06-22 MED ORDER — OXYCODONE-ACETAMINOPHEN 7.5-325 MG PO TABS: 1.0000 | ORAL_TABLET | ORAL | 0 refills | Status: AC | PRN

## 2021-06-22 NOTE — Progress Notes (Signed)
Spoke with patient's hospice nurse.  Patient is having back pain.  He has been taking Percocet 1 tablet twice daily but this is no longer fully effective.  Discussed liberalizing Percocet to 1 to 2 tablets every 4 hours as needed.  Will send refill to pharmacy.

## 2021-06-28 ENCOUNTER — Other Ambulatory Visit: Payer: Self-pay | Admitting: Hospice and Palliative Medicine

## 2021-06-28 MED ORDER — LORAZEPAM 0.5 MG PO TABS: 0.5000 mg | ORAL_TABLET | ORAL | 0 refills | Status: AC | PRN

## 2021-06-28 MED ORDER — MORPHINE SULFATE (CONCENTRATE) 10 MG /0.5 ML PO SOLN: 5.0000 mg | ORAL | 0 refills | Status: AC | PRN

## 2021-06-28 NOTE — Progress Notes (Signed)
Spoke with patient's hospice nurse.  Reportedly, he is actively terminal.  Hospice requested orders for morphine elixir and lorazepam.  Will send Rx to pharmacy.

## 2021-06-29 ENCOUNTER — Other Ambulatory Visit: Payer: Medicare HMO | Admitting: Student

## 2021-07-06 DEATH — deceased
